# Patient Record
Sex: Female | Born: 1935 | Race: White | Hispanic: No | Smoking: Never smoker
Health system: Southern US, Community
[De-identification: ages and names within clinical notes are randomized; demographics above are authoritative.]

## PROBLEM LIST (undated history)

## (undated) DIAGNOSIS — S72009A Fracture of unspecified part of neck of unspecified femur, initial encounter for closed fracture: Secondary | ICD-10-CM

## (undated) DIAGNOSIS — E785 Hyperlipidemia, unspecified: Secondary | ICD-10-CM

## (undated) DIAGNOSIS — I10 Essential (primary) hypertension: Secondary | ICD-10-CM

## (undated) DIAGNOSIS — Z87898 Personal history of other specified conditions: Secondary | ICD-10-CM

## (undated) DIAGNOSIS — I4510 Unspecified right bundle-branch block: Secondary | ICD-10-CM

## (undated) DIAGNOSIS — Z8659 Personal history of other mental and behavioral disorders: Secondary | ICD-10-CM

## (undated) DIAGNOSIS — M1990 Unspecified osteoarthritis, unspecified site: Secondary | ICD-10-CM

## (undated) DIAGNOSIS — Z8719 Personal history of other diseases of the digestive system: Secondary | ICD-10-CM

## (undated) HISTORY — DX: Unspecified osteoarthritis, unspecified site: M19.90

## (undated) HISTORY — DX: Personal history of other specified conditions: Z87.898

## (undated) HISTORY — PX: TONSILECTOMY, ADENOIDECTOMY, BILATERAL MYRINGOTOMY AND TUBES: SHX2538

## (undated) HISTORY — DX: Hyperlipidemia, unspecified: E78.5

## (undated) HISTORY — DX: Personal history of other mental and behavioral disorders: Z86.59

## (undated) HISTORY — DX: Personal history of other diseases of the digestive system: Z87.19

## (undated) HISTORY — DX: Fracture of unspecified part of neck of unspecified femur, initial encounter for closed fracture: S72.009A

## (undated) HISTORY — DX: Unspecified right bundle-branch block: I45.10

## (undated) HISTORY — DX: Essential (primary) hypertension: I10

---

## 1971-04-01 HISTORY — PX: OTHER SURGICAL HISTORY: SHX169

## 1997-07-27 ENCOUNTER — Other Ambulatory Visit: Admission: RE | Admit: 1997-07-27 | Discharge: 1997-07-27 | Payer: Self-pay | Admitting: Cardiology

## 2001-08-26 ENCOUNTER — Encounter (INDEPENDENT_AMBULATORY_CARE_PROVIDER_SITE_OTHER): Payer: Self-pay | Admitting: Specialist

## 2001-08-26 ENCOUNTER — Inpatient Hospital Stay (HOSPITAL_COMMUNITY): Admission: EM | Admit: 2001-08-26 | Discharge: 2001-09-02 | Payer: Self-pay | Admitting: *Deleted

## 2001-08-27 ENCOUNTER — Encounter: Payer: Self-pay | Admitting: *Deleted

## 2001-08-30 ENCOUNTER — Encounter: Payer: Self-pay | Admitting: *Deleted

## 2001-10-21 ENCOUNTER — Ambulatory Visit (HOSPITAL_COMMUNITY): Admission: RE | Admit: 2001-10-21 | Discharge: 2001-10-21 | Payer: Self-pay | Admitting: *Deleted

## 2001-10-26 ENCOUNTER — Encounter: Payer: Self-pay | Admitting: *Deleted

## 2001-10-26 ENCOUNTER — Ambulatory Visit (HOSPITAL_COMMUNITY): Admission: RE | Admit: 2001-10-26 | Discharge: 2001-10-26 | Payer: Self-pay | Admitting: *Deleted

## 2001-12-08 ENCOUNTER — Other Ambulatory Visit: Admission: RE | Admit: 2001-12-08 | Discharge: 2001-12-08 | Payer: Self-pay | Admitting: Obstetrics and Gynecology

## 2001-12-14 ENCOUNTER — Ambulatory Visit (HOSPITAL_COMMUNITY): Admission: RE | Admit: 2001-12-14 | Discharge: 2001-12-14 | Payer: Self-pay | Admitting: Obstetrics and Gynecology

## 2001-12-14 ENCOUNTER — Encounter: Payer: Self-pay | Admitting: Obstetrics and Gynecology

## 2002-01-12 ENCOUNTER — Ambulatory Visit (HOSPITAL_COMMUNITY): Admission: RE | Admit: 2002-01-12 | Discharge: 2002-01-12 | Payer: Self-pay | Admitting: Obstetrics and Gynecology

## 2002-01-12 ENCOUNTER — Encounter: Payer: Self-pay | Admitting: Obstetrics and Gynecology

## 2002-04-05 ENCOUNTER — Ambulatory Visit (HOSPITAL_COMMUNITY): Admission: RE | Admit: 2002-04-05 | Discharge: 2002-04-05 | Payer: Self-pay | Admitting: Urology

## 2002-04-05 ENCOUNTER — Encounter: Payer: Self-pay | Admitting: Urology

## 2002-05-11 ENCOUNTER — Encounter: Payer: Self-pay | Admitting: Obstetrics and Gynecology

## 2002-05-11 ENCOUNTER — Ambulatory Visit (HOSPITAL_COMMUNITY): Admission: RE | Admit: 2002-05-11 | Discharge: 2002-05-11 | Payer: Self-pay | Admitting: Obstetrics and Gynecology

## 2006-03-31 HISTORY — PX: CHOLECYSTECTOMY: SHX55

## 2006-12-16 ENCOUNTER — Inpatient Hospital Stay (HOSPITAL_COMMUNITY): Admission: EM | Admit: 2006-12-16 | Discharge: 2006-12-21 | Payer: Self-pay | Admitting: Emergency Medicine

## 2006-12-20 ENCOUNTER — Encounter (INDEPENDENT_AMBULATORY_CARE_PROVIDER_SITE_OTHER): Payer: Self-pay | Admitting: Surgery

## 2006-12-21 ENCOUNTER — Ambulatory Visit: Payer: Self-pay | Admitting: Vascular Surgery

## 2007-04-01 DIAGNOSIS — S72009A Fracture of unspecified part of neck of unspecified femur, initial encounter for closed fracture: Secondary | ICD-10-CM

## 2007-04-01 HISTORY — DX: Fracture of unspecified part of neck of unspecified femur, initial encounter for closed fracture: S72.009A

## 2008-02-19 ENCOUNTER — Inpatient Hospital Stay (HOSPITAL_COMMUNITY): Admission: EM | Admit: 2008-02-19 | Discharge: 2008-02-23 | Payer: Self-pay | Admitting: Emergency Medicine

## 2008-02-20 HISTORY — PX: TOTAL HIP ARTHROPLASTY: SHX124

## 2009-10-02 ENCOUNTER — Encounter: Admission: RE | Admit: 2009-10-02 | Discharge: 2009-10-02 | Payer: Self-pay | Admitting: Cardiology

## 2009-10-15 ENCOUNTER — Encounter: Admission: RE | Admit: 2009-10-15 | Discharge: 2009-10-15 | Payer: Self-pay | Admitting: Gastroenterology

## 2010-01-26 ENCOUNTER — Inpatient Hospital Stay (HOSPITAL_COMMUNITY): Admission: EM | Admit: 2010-01-26 | Discharge: 2010-01-29 | Payer: Self-pay | Admitting: Emergency Medicine

## 2010-02-14 ENCOUNTER — Ambulatory Visit: Payer: Self-pay | Admitting: Cardiology

## 2010-06-12 LAB — CBC
HCT: 38.4 % (ref 36.0–46.0)
Hemoglobin: 12.9 g/dL (ref 12.0–15.0)
MCH: 29.6 pg (ref 26.0–34.0)
MCHC: 33.1 g/dL (ref 30.0–36.0)
MCHC: 33.6 g/dL (ref 30.0–36.0)
MCV: 88 fL (ref 78.0–100.0)
MCV: 88.4 fL (ref 78.0–100.0)
Platelets: 266 10*3/uL (ref 150–400)
Platelets: 297 10*3/uL (ref 150–400)
RBC: 4.37 MIL/uL (ref 3.87–5.11)
RDW: 13.5 % (ref 11.5–15.5)
RDW: 13.5 % (ref 11.5–15.5)
WBC: 6.8 10*3/uL (ref 4.0–10.5)
WBC: 9 10*3/uL (ref 4.0–10.5)

## 2010-06-12 LAB — DIFFERENTIAL
Basophils Absolute: 0 10*3/uL (ref 0.0–0.1)
Basophils Relative: 1 % (ref 0–1)
Eosinophils Absolute: 0.1 10*3/uL (ref 0.0–0.7)
Eosinophils Relative: 1 % (ref 0–5)
Lymphocytes Relative: 12 % (ref 12–46)
Lymphocytes Relative: 31 % (ref 12–46)
Lymphs Abs: 1.1 10*3/uL (ref 0.7–4.0)
Lymphs Abs: 2.1 10*3/uL (ref 0.7–4.0)
Monocytes Absolute: 0.6 10*3/uL (ref 0.1–1.0)
Monocytes Absolute: 0.7 10*3/uL (ref 0.1–1.0)
Monocytes Relative: 7 % (ref 3–12)
Monocytes Relative: 9 % (ref 3–12)
Neutro Abs: 3.9 10*3/uL (ref 1.7–7.7)
Neutro Abs: 7.1 10*3/uL (ref 1.7–7.7)
Neutrophils Relative %: 58 % (ref 43–77)
Neutrophils Relative %: 79 % — ABNORMAL HIGH (ref 43–77)

## 2010-06-12 LAB — COMPREHENSIVE METABOLIC PANEL
Albumin: 2.6 g/dL — ABNORMAL LOW (ref 3.5–5.2)
Alkaline Phosphatase: 151 U/L — ABNORMAL HIGH (ref 39–117)
BUN: 7 mg/dL (ref 6–23)
Calcium: 8.6 mg/dL (ref 8.4–10.5)
Creatinine, Ser: 0.55 mg/dL (ref 0.4–1.2)
Potassium: 3.5 mEq/L (ref 3.5–5.1)
Total Protein: 5.2 g/dL — ABNORMAL LOW (ref 6.0–8.3)

## 2010-06-12 LAB — PROTIME-INR
INR: 1.05 (ref 0.00–1.49)
Prothrombin Time: 13.9 seconds (ref 11.6–15.2)

## 2010-06-12 LAB — HEMOGLOBIN A1C: Mean Plasma Glucose: 123 mg/dL — ABNORMAL HIGH (ref <117)

## 2010-06-12 LAB — POCT CARDIAC MARKERS
CKMB, poc: <1 ng/mL — ABNORMAL LOW (ref 1.0–8.0)
Myoglobin, poc: 68.5 ng/mL (ref 12–200)
Troponin i, poc: <0.05 ng/mL (ref 0.00–0.09)

## 2010-06-12 LAB — URINALYSIS, ROUTINE W REFLEX MICROSCOPIC
Bilirubin Urine: NEGATIVE
Glucose, UA: NEGATIVE mg/dL
Hgb urine dipstick: NEGATIVE
Ketones, ur: 15 mg/dL — AB
Nitrite: NEGATIVE
Protein, ur: NEGATIVE mg/dL
Specific Gravity, Urine: 1.021 (ref 1.005–1.030)
Urobilinogen, UA: 1 mg/dL (ref 0.0–1.0)
pH: 5.5 (ref 5.0–8.0)

## 2010-06-12 LAB — POCT I-STAT, CHEM 8
BUN: 11 mg/dL (ref 6–23)
Calcium, Ion: 1.19 mmol/L (ref 1.12–1.32)
Chloride: 106 mEq/L (ref 96–112)
Creatinine, Ser: 0.5 mg/dL (ref 0.4–1.2)
Glucose, Bld: 109 mg/dL — ABNORMAL HIGH (ref 70–99)
HCT: 40 % (ref 36.0–46.0)
Hemoglobin: 13.6 g/dL (ref 12.0–15.0)
Potassium: 3.7 mEq/L (ref 3.5–5.1)
Sodium: 138 mEq/L (ref 135–145)
TCO2: 24 mmol/L (ref 0–100)

## 2010-06-12 LAB — URINE CULTURE
Colony Count: >=100000
Culture  Setup Time: 201110300232

## 2010-06-12 LAB — HEPATIC FUNCTION PANEL
ALT: 17 U/L (ref 0–35)
AST: 24 U/L (ref 0–37)
Albumin: 3.1 g/dL — ABNORMAL LOW (ref 3.5–5.2)
Alkaline Phosphatase: 192 U/L — ABNORMAL HIGH (ref 39–117)
Bilirubin, Direct: 0.3 mg/dL (ref 0.0–0.3)
Indirect Bilirubin: 0.9 mg/dL (ref 0.3–0.9)
Total Bilirubin: 1.2 mg/dL (ref 0.3–1.2)
Total Protein: 5.8 g/dL — ABNORMAL LOW (ref 6.0–8.3)

## 2010-06-12 LAB — CARDIAC PANEL(CRET KIN+CKTOT+MB+TROPI)
Relative Index: INVALID (ref 0.0–2.5)
Total CK: 40 U/L (ref 7–177)
Troponin I: 0.01 ng/mL (ref 0.00–0.06)

## 2010-06-12 LAB — RPR: RPR Ser Ql: NONREACTIVE

## 2010-06-12 LAB — RAPID URINE DRUG SCREEN, HOSP PERFORMED
Amphetamines: NOT DETECTED
Barbiturates: NOT DETECTED
Benzodiazepines: NOT DETECTED
Opiates: POSITIVE — AB

## 2010-06-12 LAB — HEMOCCULT GUIAC POC 1CARD (OFFICE): Fecal Occult Bld: NEGATIVE

## 2010-06-12 LAB — VITAMIN B12: Vitamin B-12: 351 pg/mL (ref 211–911)

## 2010-06-12 LAB — TSH: TSH: 0.701 u[IU]/mL (ref 0.350–4.500)

## 2010-06-17 ENCOUNTER — Ambulatory Visit (INDEPENDENT_AMBULATORY_CARE_PROVIDER_SITE_OTHER): Payer: Medicare Other | Admitting: Cardiology

## 2010-06-17 DIAGNOSIS — I119 Hypertensive heart disease without heart failure: Secondary | ICD-10-CM

## 2010-06-17 DIAGNOSIS — Z79899 Other long term (current) drug therapy: Secondary | ICD-10-CM

## 2010-06-17 DIAGNOSIS — E7800 Pure hypercholesterolemia, unspecified: Secondary | ICD-10-CM

## 2010-08-13 NOTE — H&P (Signed)
[name], [name]                  ACCOUNT NO.:  [id number]   MEDICAL RECORD NO.:  [id number]          PATIENT TYPE:  INP   LOCATION:  1529                         FACILITY:  [facility]   PHYSICIAN:  [name] [name], M.D. DATE OF BIRTH:  Sep 16, 1922   DATE OF ADMISSION:  12/16/2006  DATE OF DISCHARGE:                              HISTORY & PHYSICAL   CHIEF COMPLAINT:  Abdominal pain.   HISTORY:  This is an 77 year old widowed Caucasian female admitted with  abdominal discomfort and back pain.  She went to bed last night feeling  well and awoke in the middle of the night with severe back pain which  radiated around to her right upper quadrant of the abdomen.  She started  to vomit several times and it was dark emesis.  She had several normal  bowel movements last evening as well.  She did not have any chest pain  to suggest angina.  She was brought by her family to the emergency room,  where her serum amylase was greater than 5000 and it was felt that she  had acute pancreatitis.  CT scan of the abdomen showed evidence of  pancreatitis and raised question of cholelithiasis.  An ultrasound of  the abdomen was requested and results are pending.  The patient does not  have any history of known prior cholelithiasis.  The patient was  hospitalized by Dr. Roosvelt [name] in 2003 for what was diagnosed as  severe inflammatory colitis secondary to diverticulosis and at that  time, was seen by Dr. Wenda [name] and surgery was contemplated, but the  patient improved and did not require surgery.   FAMILY HISTORY:  The patient's sister died of colon cancer.  The  patient's mother died at a young age of tuberculosis.  The patient's  father died of suicide.   SOCIAL HISTORY:  Reveals that the patient is a widow.  She lives alone.  She originally had 3 children, but a son died at age 22 and she has 2  living children who live in [place].  The patient does not use  alcohol or tobacco.   HOME  MEDICATIONS:  1. Lexapro 10 mg daily.  2. Zetia 10 mg daily.  3. Lipitor 10 mg half a tablet daily.  4. Benicar 20 mg daily.  5. Lasix 20 mg p.r.n.  6. Calcium daily.  7. Ecotrin 325 mg daily.  8. Xanax for p.r.n. use.  9. She takes Actonel 35 mg a week.   ALLERGIES:  The patient is allergic to CODEINE.   PREVIOUS SURGERY:  Hysterectomy and breast biopsy years ago.   REVIEW OF SYSTEMS:  She has a history of hypertension and  hypercholesterolemia.  She has had mild cardiomegaly on chest x-ray and  has had a right bundle branch block on EKG for the past several years.  She had an echocardiogram, July 02, 2006, which showed normal LV  ejection fraction of 55% and LVH.  The patient has no history of peptic  ulcer disease.  She does have a history of urinary hesitancy and  urgency.  Remainder of review of systems is negative in detail.   PHYSICAL EXAM:  VITAL SIGNS:  Blood pressure is 130/60, pulse 90,  regular, temperature 97.7.  GENERAL APPEARANCE:  Reveals an elderly thin woman in no acute distress.  SKIN:  Warm and dry.  She is anicteric.  HEENT:  Reveals no scleral icterus.  Pupils are equal and reactive.  CHEST:  Clear to auscultation.  HEART:  Reveals no gallop, murmur, click or rub.  BREASTS:  No masses.  ABDOMEN:  Remarkable for decreased bowel sounds and tenderness in the  epigastrium with a sense of fullness to palpation in the epigastrium  consistent with pancreatitis.  EXTREMITIES:  Show no phlebitis or edema.  Pedal pulses are present.   IMPRESSION:  1. Acute pancreatitis, possibly secondary to cholelithiasis.  2. History of hyperlipidemia, on Lipitor and Zetia.  3. History of depression, on Lexapro.  4. History of hypertension, on Benicar.   DISPOSITION:  We are admitting to the general medical floor.  Will keep  her n.p.o.  We will give her IV fluid resuscitation and IV analgesics.  I will ask the surgeons to see and follow.  We will give IV Protonix   empirically for positive gastric emesis.  We will hold her Lipitor and  Zetia at this point.   CONDITION ON ADMISSION:  Guarded.           ______________________________  [name] [name], M.D.     TB/MEDQ  D:  12/16/2006  T:  12/17/2006  Job:  [id number]   cc:   [name] Park Daphine Deutscher, MD  [address], [address]  [place]  [place] [address]   [name] [name] [facility]

## 2010-08-13 NOTE — Discharge Summary (Signed)
NAME:  [name] [name], [name] [name]                  ACCOUNT NO.:  [id number]   MEDICAL RECORD NO.:  [id number]          PATIENT TYPE:  INP   LOCATION:  1529                         FACILITY:  [facility]   PHYSICIAN:  Alfonse Ras, MD   DATE OF BIRTH:  July 16, 1922   DATE OF ADMISSION:  12/16/2006  DATE OF DISCHARGE:                               DISCHARGE SUMMARY   HISTORY OF PRESENT ILLNESS:  The patient is an 77 year old white female  who was admitted with abdominal and chest pain also which went through  to her back and her right upper quadrant.  Workup with CT scan has shown  evidence of pancreatitis consistent with her elevated enzymes.  She  admits to significant nausea and right back pain.  She has come into the  emergency room because of her abdominal pain.  She was admitted by Dr.  Patty Sermons, and I was asked to consult from the surgical perspective.   PAST MEDICAL HISTORY:  Significant for inflammatory colitis back in 2003  without surgical intervention.   PAST SURGICAL HISTORY:  Significant for breast biopsy and a hysterectomy  in the remote past.   PHYSICAL EXAMINATION:  GENERAL:  On physical exam, she is an age-  appropriate white female in minimal distress.  HEENT:  Benign.  Normocephalic and atraumatic.  Pupils are equal, round,  and reactive to light.  She does appear to have a cataract in the left  eye.  There is no jaundice.  LUNGS:  Clear to auscultation and percussion x2.  HEART:  Regular rate and rhythm without murmurs, rubs, or gallops.  ABDOMEN:  Decreased bowel sounds but is tender in the epigastrium to  fairly deep palpation.  EXTREMITIES:  Show no clubbing, cyanosis, or edema.  VITAL SIGNS:  Her blood pressure is 126/70, her heart rate is 92, and  her temperature is 98.2.   LABORATORY DATA:  Laboratory value shows an amylase over 5000 and lipase  over 2000.  Her white blood cell count is 12,000.  Her ultrasound shows  sludge, but no stones and minimal dilatation of her  hepatic ducts.   IMPRESSION:  Probable biliary pancreatitis.   PLAN:  I agree with NPO status, IV fluids, but she will probably require  a laparoscopic cholecystectomy during this hospitalization.   We will continue to follow along with you.      Alfonse Ras, MD  Electronically Signed     KRE/MEDQ  D:  12/16/2006  T:  12/17/2006  Job:  [id number]   cc:   Cassell Clement, M.D.  Fax: [phone]

## 2010-08-13 NOTE — Consult Note (Signed)
NAME:  [name] [name], [name] [name]                  ACCOUNT NO.:  [id number]   MEDICAL RECORD NO.:  [id number]          PATIENT TYPE:  EMS   LOCATION:  ED                           FACILITY:  [facility]   PHYSICIAN:  Lyn Records, M.D.   DATE OF BIRTH:  April 07, 1922   DATE OF CONSULTATION:  02/19/2008  DATE OF DISCHARGE:                                 CONSULTATION   CONCLUSIONS:  1. The patient has no known cardiac problems.  2. Acute right hip fracture.  3. History of depression.  4. Hypertension.  5. Hyperlipidemia.   RECOMMENDATIONS:  1. The patient is cleared for surgery.  Her rate should be low for      cardiac-related complications given her benign past medical history      and absence of cardiac symptoms.  2. Continue usual home medications.  3. Check a baseline BNP.  4. ECG postoperative.   COMMENT:  The patient is a pleasant 77 year old who fell from a step  earlier today when she lost her balance.  She did not lose  consciousness.  She was reaching over to pick up an object when she lost  her balance and could not ride herself.  There was no chest pain,  palpitations, shortness of breath, or other complaint preceding or  following the fall.  She also had pain in the right hip after the fall.  She has never had syncope.  There is no history of congestive heart  failure, aortic valve disease, arrhythmias, or other chronic medical  problems other than as noted above.   ALLERGIES:  Codeine, causes nausea.   MEDICATIONS:  1. Lipitor 10 mg per day.  2. Zetia 10 mg per day.  3. Benicar 20 mg 1/2 tablet per day.  4. Lexapro 10 mg per day.  5. Lasix 20 mg as needed for peripheral edema.   PREVIOUS SURGERIES:  1. Cholecystectomy following an episode of gallstone pancreatitis,      2008.  2. Hysterectomy.  3. Breast biopsy   SOCIAL HISTORY:  She does not smoke or drink.  She is widowed, has 3  children, one of whom died at age 60.   PHYSICAL EXAMINATION:  GENERAL:  The patient is in no  distress.  She is  lying flat on the hospital gurney.  She has 2 visitors in the room.  I  do not set her up because of the hip fracture and pain with movement.  VITAL SIGNS:  The respiratory rate is 16, blood pressure 132/70, and  heart rate is 68.  LUNGS:  Clear to auscultation anteriorly.  CARDIAC:  The cardiac exam reveals no gallop, rub, or murmur.  ABDOMEN:  Her abdomen is soft.  Bowel sounds are normal.  Liver and  spleen are not palpable.  Bowel sounds are normal.  EXTREMITIES:  Extremities reveals an externally rotated foot.  Pulses  are 2+ in the posterior tibial and dorsalis pedis bilaterally.  NEUROLOGIC:  The neurologic exam reveals the patient who is alert,  oriented, and exhibits no focal deficits.  She does not move the right  leg because of the fracture.   The EKG is unremarkable.  It exhibits normal sinus rhythm with a chronic  stable right bundle-branch block.  There appears to be limb lead  reversal on the EKG that was performed.  The x-ray demonstrates normal  lung fields without infiltrates or congestion.  The heart size was  slightly prominent, but the technique could account for this.  There was  descending aortic tortuosity.  The right lower extremity x-ray reveals a  right femoral shaft fracture.   Laboratory data demonstrates an INR of 0.9; hemoglobin 13.4; potassium  3.7, BUN and creatinine 17 and 1.1.   DISCUSSION:  The patient's electrocardiogram demonstrates a right bundle-  branch block.  She has normal sinus rhythm without acute ST-T wave  change.  No symptoms suggestive of decompensated cardiac problem.  She  should be relatively at low risk for cardiac complications.  I have,  therefore, cleared her to proceed.  We will follow postoperatively to  help in any way possible.      Lyn Records, M.D.  Electronically Signed     HWS/MEDQ  D:  02/19/2008  T:  02/20/2008  Job:  [id number]   cc:   Cassell Clement, M.D.  Fax: [phone]   Dr. Shon Baton

## 2010-08-13 NOTE — Discharge Summary (Signed)
[name], [name]                  ACCOUNT NO.:  [id number]   MEDICAL RECORD NO.:  [id number]          PATIENT TYPE:  INP   LOCATION:  1537                         FACILITY:  [facility]   PHYSICIAN:  Alvy Beal, MD    DATE OF BIRTH:  02/09/1923   DATE OF ADMISSION:  02/19/2008  DATE OF DISCHARGE:  02/23/2008                               DISCHARGE SUMMARY   ADMISSION DIAGNOSES:  1. Right intertrochanteric hip fracture.  2. History of pancreatitis.  3. Hypertension.  4. Diverticulitis.  5. Hypercholesteremia.  6. Irregular heartbeat.   DISCHARGE DIAGNOSES:  1. Status post IM nail of the right hip.  2. Resolved postoperative anemia.  3. Stable hypokalemia.   HISTORY:  [name] [name] is a pleasant 77 year old female who fell on November  21 while she was walking up the stairs and slipped.  She noted immediate  onset of right hip pain.  She was transferred to the emergency room at  [facility] evaluated by Dr. Shon Baton and found to have a regular  rate intertrochanteric hip fracture.  Following cardiac clearance, the  patient was to be scheduled to undergo IM nail of the right hip.   CONSULTS:  PT and OT.  Cardiology.  Case management.   LABORATORY:  On admission, BMET showed sodium 139, potassium 4.2,  slightly low, glucose 129, normal BUN and creatinine.  This was followed  throughout the hospital course.  By the time of discharge, potassium  slightly decreased at 3.4.  Preoperative urinalysis showed trace  ketones, otherwise negative.  PT/INR at admission showed PT of 13.8, INR  of 1.  At time of discharge she was therapeutic on Coumadin with INR of  2.  BNP admission of 69.2.  Blood type B+.  On admission the patient had  a hematocrit 39.9.  Following surgery, this did drop to 7.7.  The  patient was transfused.  At time of discharge she was stable with a  hemoglobin of 11.2, hematocrit 33.1.  Preoperative EKG showed sinus  rhythm with premature supraventricular complexes,  right bundle branch  block with no T-wave abnormality.  Questionable inferior ischemia.  Reviewed by cardiology.  X-rays of the hip showed displaced  intertrochanteric right hip fracture.  Postoperative films showed ORIF  of the right hip.  Preop chest x-ray shows no active disease.   HOSPITAL COURSE:  The patient was admitted, medical clearance was  obtained from cardiology, consent was obtained.  She was then taken to  the operating room and underwent IM nail by Dr. Venita Lick.  She was  then transferred to the PACU and then to the orthopedic floor for  continued postop care.  The patient was placed on Coumadin  postoperatively.  The patient did fairly well following her surgery.  There were no significant complications.  She did have a slight drop in  her hemoglobin to 7.7.  She was transfused with 2 units packed red blood  cells.  The case manager was consulted for placement.  Coumadin was  continued per pharmacy for DVT prophylaxis.  The patient was continued  with PT/OT.  The patient continued to progress slowly.  Postop day  number 4, felt patient stable to be discharged to skilled nursing  facility.  Cardiology agreed.  The patient was stable.  She did have a  slight drop in her potassium to 3.4.  This was supplemented.   DISPOSITION:  Patient stable to be discharged to skilled nursing  facility of choice.  She will need followup with Dr. Shon Baton in  approximately in 2 weeks for x-ray as well as staple removal.  The  patient is to be toe-touch weightbearing to the right lower extremity.  Hip precautions should be initiated.  Daily dressing changes to the  right hip.  Okay for her to shower.  Diet is advance as tolerated.   DISCHARGE MEDICATIONS:  1. Lexapro 10 mg 1 p.o. daily.  2. Zetia 10 mg 1 p.o. daily.  3. Lipitor 10 mg 1 p.o. daily.  4. Benicar 20 mg 1 p.o. daily.  5. Furosemide 20 mg 1 p.o. p.r.n.  6. Fosamax weekly.  7. Coumadin as dosed per pharmacy.  8.  Trinsicon 1 p.o. t.i.d.  9. Vitamin C 500 mg daily.  10.Robaxin 500 mg 1 p.o. q.8 p.r.n. spasm.  11.Percocet 5/325 one to two p.o. q.4 to 6 p.r.n. pain.  12.Colace 100 mg one p.o. b.i.d.   CONDITION ON DISCHARGE:  Stable.   FINAL DIAGNOSIS:  Status post IM nail to the right hip.      Roma Schanz, P.A.      Alvy Beal, MD  Electronically Signed    CS/MEDQ  D:  02/23/2008  T:  02/23/2008  Job:  [id number]

## 2010-08-13 NOTE — Op Note (Signed)
NAME:  [name] [name], [name] [name]                  ACCOUNT NO.:  [id number]   MEDICAL RECORD NO.:  [id number]          PATIENT TYPE:  INP   LOCATION:  1537                         [facility]AC[facility]:  [facility]   PHYSICIAN:  Alvy Beal, MD    DATE OF BIRTH:  07/19/1922   DATE OF PROCEDURE:  DATE OF DISCHARGE:                               OPERATIVE REPORT   PREOPERATIVE DIAGNOSIS:  Right intertrochanteric hip fracture.   POSTOPERATIVE DIAGNOSIS:  Right intertrochanteric hip fracture.   PROCEDURE:  Intertroch nail for ORIF of intertrochanteric hip fracture  utilizing the 130 degree 235 length troch nail with a 100 mm lag screw  and a 34 mm distal locking screw.   COMPLICATIONS:  None.   CONDITION:  Stable.   ESTIMATED BLOOD LOSS:  Minimal.   HISTORY:  This is a pleasant 77 year old woman who fell yesterday and  was admitted with a diagnosis of intertrochanteric hip fracture.  All  appropriate risks, benefits and alternatives were discussed with the  patient and her family.  Consent was obtained to the aforementioned  procedure.   OPERATIVE NOTE:  The patient was brought to the operating room and  placed on the fracture table.  After the successful induction of general  anesthesia and endotracheal intubation, TEDs, SCDs and a Foley were  applied.  The operative leg was appropriately identified preoperatively.  This was the right side.  The left leg was placed in a well leg holder  in a slightly flexed abducted position.  The right lower extremity was  placed in traction.  A gentle closed reduction maneuver was performed.  X-rays confirmed satisfactory reduction.  The right lower extremity was  then prepped and draped in a standard fashion.   The I pin was inserted percutaneously down to the level of the tip of  the greater troch and was confirmed on the AP and lateral planes and  then advanced just to about 3-4 cm below the lesser troch, confirmed  trajectory in position in the AP and lateral planes.   Once confirmed I  made a small incision at the insertion site of the pin and advanced the  all-in-one step drill.  I drilled through the greater troch and into the  proximal femur.  Because of the narrow femoral canal I placed a long  guidewire down and then reamed to a single pass with 11.5 distal reamer  just so that the nail would not get caught up as it was passing  distally.  This allowed me to freely insert the troch nail without  complications.  Once it was at the appropriate depth I then made a  counter incision on the lateral side of the femur and advanced the  working portal through the guide so it would rest on the lateral aspect  of the femur.  I confirmed position in the AP and lateral planes and  advanced the guide pin through the femur, the nail and into the femoral  head.  I confirmed satisfactory position in the AP and lateral planes  and the appropriate depth.  I then measured, overdrilled  and advanced  the 100 mm lag screw.  At this point with this fracture satisfactorily  reduced and the hardware in good position I locked the nail in place by  advancing the screw driver down the head of the nail to the locking  device.  I backed it off a quarter turn.  I then advanced the locking  pin guide through the external targeting device and through the same  incision I used earlier.  I then placed on the lateral side of the  femur, drilled across the femur and measured.  I placed a 34 mm locking  screw without complication.   I removed the external jig, took final C-arm films which were  satisfactory.  At this point I irrigated the wounds, closed the deep  fascia with interrupted #1 Vicryl sutures, superficial with 2-0 Vicryl  sutures and staples for the skin.  Dry dressing was applied.  The  patient was extubated and transferred to the PACU without incident.  At  the end of the case all needle and sponge counts were correct.      Alvy Beal, MD  Electronically  Signed     DDB/MEDQ  D:  02/20/2008  T:  02/20/2008  Job:  [id number]

## 2010-08-13 NOTE — Discharge Summary (Signed)
[name] [name], [name]                  ACCOUNT NO.:  [id number]   MEDICAL RECORD NO.:  [id number]          PATIENT TYPE:  INP   LOCATION:  1529                         [facility]ACILITY:  [facility]   PHYSICIAN:  [name] [name], M.D. DATE OF BIRTH:  02-Dec-1922   DATE OF ADMISSION:  12/16/2006  DATE OF DISCHARGE:  12/21/2006                               DISCHARGE SUMMARY   FINAL DIAGNOSES:  1. Biliary pancreatitis with cholelithiasis.  2. History of hyperlipidemia.  3. History of depression.  4. Essential hypertension.   OPERATIONS PERFORMED:  Laparoscopic cholecystectomy with intraoperative  cholangiography on December 20, 2006, by Dr. Darnell [name].   HISTORY:  This 77 year old widowed Caucasian female was admitted as an  emergency on December 16, 2006, because of abdominal discomfort and  back pain.  She had gone to bed the previous evening feeling well, but  awoke in the middle of the night with severe back pain radiating to the  right upper quadrant.  She vomited several times and it was dark emesis.  She also had several normal bowel movement the previous evening.  She  did not have any chest pain to suggest angina.  She came to the  emergency room, brought by her family, and her serum amylase was noted  to be greater than 5000.  It was felt that she had acute pancreatitis.  Workup also revealed evidence of cholelithiasis.  The patient did not  have any prior history of known gallbladder disease, but in 2003, she  had been hospitalized by Dr. Roosvelt [name] for what was felt to be  severe inflammatory colitis secondary to diverticulosis.   FAMILY HISTORY:  Revealed that the patient's sister died of colon  cancer.   SOCIAL HISTORY:  Reveals that the patient is a widow.  She lives alone.  She has a son who died at age 66, and she has two living daughters who  live in [place].  The patient does not use alcohol or tobacco.   HOME MEDICATIONS:  Lexapro, Zetia, Lipitor, Benicar, Lasix,  calcium,  Ecotrin, Xanax and Actonel.   PHYSICAL EXAMINATION ON ADMISSION:  VITAL SIGNS:  Blood pressure 130/60,  pulse 90, temperature 97.  GENERAL:  This is an elderly woman in no distress.  She is not icteric.  SKIN:  Warm and dry.  LUNGS:  Clear.  HEART:  Reveals no gallop.  BREASTS:  No masses.  ABDOMEN:  Reveals a very quiet abdomen with absent bowel sounds and has  a sense of fullness to palpation in the epigastrium, and there is  tenderness in the epigastrium.  EXTREMITIES:  Show no phlebitis and pedal pulses are present.   HOSPITAL COURSE:  The patient was admitted to the general medical floor.  She was seen in consultation by General Surgery and followed.  She was  given IV fluids and IV analgesics.  She was also given IV Protonix  empirically.  Her Lipitor and Zetia were held.  The patient initially  had an amylase of 5682 with a lipase greater than 2000.  On the day of  discharge, amylase was down  to 144 with a lipase of 49.  At the time of  discharge, her sodium was 139, potassium 3.5, blood sugar 93, BUN 10,  creatinine 0.6.  Bilirubin was 1.2, SGOT at discharge still slightly  high at 38 with SGPT of 45 and total protein was down to 5.5 with an  albumin of 2.6 at discharge.   One day prior to discharge on September 21, the patient was taken to  surgery and underwent laparoscopic cholecystectomy by Dr. Darnell [name].  The patient tolerated the procedure well.  By the next morning, she was  afebrile.  Vital signs were stable.  She was hungry and it was felt that  she was stable enough for discharge.  She did have a slight erythema and  discomfort in the left calf which looked more like an insect bite with  erythema, but it was tender, and in view of the clinical setting prior  to discharge, we did get venous Dopplers to be sure that this was not an  atypical presentation of DVT.  The Dopplers showed no evidence of DVT,  and she was cleared for discharge by the surgeons  and was able to be  discharged improved.   DISCHARGE MEDICATIONS:  1. Tylenol 3.252 every 4-6 hours p.r.n. for pain.  2. Lexapro 10 mg daily.  3. Benicar 20 mg daily.  4. Lasix 20 mg p.r.n.  5. Calcium daily.  6. Xanax p.r.n.  7. Actonel 35 mg per week.  8. She will resume her Lipitor (10 mg one half tablet daily) and her      Zetia (10 mg daily) on October 1.   CONDITION ON DISCHARGE:  Improved.   FOLLOWUP:  Follow-up will be with General Surgery as directed, and she  is to see Dr. Patty [name] in 2 weeks for follow-up office visit.           ______________________________  [name] [name], M.D.     TB/MEDQ  D:  12/21/2006  T:  12/22/2006  Job:  [id number]   cc:   [name] Heckler, MD  [address]  [place]  [place] [address]   [name] Ras, MD  [address], [address]  [place]  [place] [address]

## 2010-08-13 NOTE — Op Note (Signed)
[name] [name], [name] [name]                  ACCOUNT NO.:  [id number]   MEDICAL RECORD NO.:  [id number]          PATIENT TYPE:  INP   LOCATION:  1529                         FACILITY:  [facility]   PHYSICIAN:  Velora Heckler, MD      DATE OF BIRTH:  08/10/1922   DATE OF PROCEDURE:  12/20/2006  DATE OF DISCHARGE:                               OPERATIVE REPORT   PREOPERATIVE DIAGNOSIS:  Biliary pancreatitis, cholelithiasis.   POSTOPERATIVE DIAGNOSIS:  Biliary pancreatitis, cholelithiasis.   PROCEDURE:  Laparoscopic cholecystectomy with intraoperative  cholangiography.   SURGEON:  Velora Heckler, MD, FACS   ASSISTANT:  Adolph Pollack, M.D., FACS   ANESTHESIA:  General per Dr. Ronelle Nigh.   ESTIMATED BLOOD LOSS:  Minimal.   PREPARATION:  Betadine.   COMPLICATIONS:  None.   INDICATIONS:  The patient is an 77 year old white female initially  admitted on the medical service for the abdominal pain.  She was found  to have biliary pancreatitis when CT scan of the abdomen showed  pancreatitis and serum amylase level was greater than 5000.  The patient  was managed conservatively with bowel rest, intravenous fluids and pain  control.  Her enzymes spontaneously improved.  She now comes to the  operating room for cholecystectomy.   BODY OF REPORT:  Procedure was done in OR #1 at [facility].  The patient is brought to the operating room, placed in  supine position on the operating room table.  Following administration  of general anesthesia the patient is prepped and draped in usual strict  aseptic fashion.  After ascertaining that an adequate level of  anesthesia been obtained, a infraumbilical incision was made with a #15  blade.  Dissection was carried down through subcutaneous tissues.  Fascia was incised in the midline.  The peritoneal cavity is entered  cautiously.  A 0 Vicryl pursestring suture was placed in the fascia.  A  Hasson cannula was introduced and secured  with a pursestring suture.  Abdomen was insufflated with carbon dioxide.  The laparoscope was  introduced.  Abdomen was explored.  There is a distended, edematous  appearing gallbladder in the right upper quadrant.  Operative ports were  placed along the right costal margin in the midline, midclavicular line,  anterior axillary line.  Fundus of the gallbladder is grasped and  retracted cephalad.  Dissection is begun at the neck of the gallbladder.  Cystic duct is dissected out.  Clip was placed at the neck of the  gallbladder.  Cystic duct is incised.  Blood tinge bile emanates from  the cystic duct.  A Cook cholangiography catheter was introduced through  stab wound in the right upper quadrant and inserted into the cystic duct  under direct vision.  Using C-arm fluoroscopy, real time cholangiography  was performed.  There is rapid filling of the spiral valves of Heister  in the cystic duct.  There is flow into the common bile duct.  There is  reflux of contrast into the right and left hepatic ductal systems.  There is free flow distally  into the duodenum.  There are no significant  filling defects or evidence of obstruction.  Cholangiogram was reviewed  by Dr. Maryelizabeth Rowan in radiology who agrees with the above  interpretation.   Next the [facility] catheter was removed from the peritoneal cavity.  Cystic  duct is triply clipped and divided.  Cystic artery was dissected out,  doubly clipped, and divided.  Posterior branches of the cystic artery  are singly clipped and then divided with the electrocautery.  Gallbladder is excised from the gallbladder bed using the hook  electrocautery for hemostasis.  Gallbladder was then extracted through  the umbilical port without difficulty.  It is submitted in its entirety  to pathology for review.  Right upper quadrant was irrigated with warm  saline which was evacuated.  Good hemostasis was noted.  Pneumoperitoneum was released.  Ports are removed  under direct vision.  Good hemostasis was noted at all port sites.  Port sites were  anesthetized with local anesthetic.  All wounds were closed with  interrupted 4-0 Vicryl subcuticular sutures.  Wounds washed and dried  and Benzoin and Steri-Strips are applied.  Sterile dressings were  applied.  The patient is awakened from anesthesia and brought to the  recovery room in stable condition.  The patient tolerated the procedure  well.      Velora Heckler, MD  Electronically Signed     TMG/MEDQ  D:  12/20/2006  T:  12/21/2006  Job:  [id number]   cc:   Cassell Clement, M.D.  Fax: [phone]

## 2010-08-13 NOTE — H&P (Signed)
NAME:  [name] [name]                  ACCOUNT NO.:  [id number]   MEDICAL RECORD NO.:  [id number]          PATIENT TYPE:  EMS   LOCATION:  ED                           FACILITY:  [facility]   PHYSICIAN:  Alvy Beal, MD    DATE OF BIRTH:  1922/05/20   DATE OF ADMISSION:  02/19/2008  DATE OF DISCHARGE:                              HISTORY & PHYSICAL   REASON FOR ADMISSION:  Right intertrochanteric hip fracture.   HISTORY:  [name] [name] is a very pleasant 77 year old woman who was in her  usual state of health until she fell earlier today.  The patient reports  that she was walking up the stairs and slipped and fell.  There was no  loss of consciousness, no head injury, no blurry vision, no syncopal  complaints.  She fell and she noted immediate right hip pain and was  unable to ambulate.  The patient was transferred here to the emergency  room at [facility] for further management.   PAST MEDICAL, SURGICAL, FAMILY AND SOCIAL HISTORY:  Includes  pancreatitis, status post laparoscopic cholecystectomy in September  2008.  She has hypertension, diverticulitis, hypercholesterolemia,  irregular heartbeat.  She has been seen by Dr. Katrinka Blazing from Cardiology and  cleared for surgery.  She is a nonsmoker, nondrinker, non-drug abuser.   DRUG ALLERGIES:  CODEINE.   She is currently taking Lexapro, Zetia, Lipitor, Benicar, furosemide.   CLINICAL EXAM:  She is a pleasant woman who appears her stated age in no  acute distress.  She is alert and oriented x3.  She does have slight  hearing loss.  Cranial nerves II-XII are grossly intact except for  diminished hearing.  No shortness of breath or chest pain.  ABDOMEN:  Soft and nontender.  No history of incontinence of bowel and  bladder.  She is neurologically intact with intact dorsalis pedis pulses,  posterior tibialis pulses, EHL, gastrocnemius.  Tibialis anterior  function is intact.  No sensory deficits in the lower extremity.  No  knee or ankle pain  bilaterally.  Left lower extremity is asymptomatic in  terms of range of motion and pain.  Intact dorsalis pedis, posterior  tibialis pulses.   X-rays demonstrate an intertrochanteric four-part fracture with  comminution.   At this point in time I have discussed in length with the patient, her  daughter-in-law, family and friend who is present for the discussion.  The patient has an intertrochanteric complex fracture.  The treatment  options include nonoperative management and surgical management.  I  think the best course of action to give her the best recovery is a  surgical one.  I have discussed this with the family and the patient.  The risks include infection, bleeding, nerve damage, death, stroke,  paralysis, failure to heal, need for further surgery, degenerative joint  disease, hardware failure, nonunion, need for further surgery in the  future.  All of this was discussed.  All the family's and the patient's  questions were addressed and we will plan on admission, pain control and  then surgery Sunday, February 19, 2008.  SURGICAL DIAGNOSIS:  Intertrochanteric right hip fracture.   SURGICAL PROCEDURE:  Open reduction internal fixation with a  trochanteric nail.      Alvy Beal, MD  Electronically Signed     DDB/MEDQ  D:  02/19/2008  T:  02/19/2008  Job:  [id number]

## 2010-08-16 NOTE — Discharge Summary (Signed)
[facility]  Patient:    [name] [name], [name] [name] Visit Number: [id number] MRN: [id number]          Service Type: MED Location: 3W 0352 01 Attending Physician:  Mingo Amber Dictated by:   Roosvelt Harps, M.D. Admit Date:  08/26/2001 Discharge Date: 09/02/2001   CC:         Thomas A. Patty Sermons, M.D.  [place] Daphine Deutscher, M.D.   Discharge Summary  DISCHARGE DIAGNOSES: 1. Severe inflammatory colitis of the left colon likely from diverticulosis    although the possibility of ischemia has been raised. 2. Osteoporosis. 3. Hypertension. 4. Status post hysterectomy. 5. Hypercholesterolemia. 6. Anxiety.  HISTORY OF PRESENT ILLNESS:  The patient is a 77 year old female referred to my office by Dr. Patty Sermons for lower abdominal pain, constipation, and weight loss.  In the office, she was discovered to have a rectal mass and relatively severe discomfort.  In addition, she had near-syncopal episodes when trying to defecate.  For these reasons, she was admitted to the hospital for probable colonoscopy and required preparation.  ADMISSION PHYSICAL EXAMINATION:  She was afebrile with a blood pressure 110/80, pulse 96.  Pertinent findings included anicteric sclerae, clear chest, regular heart sounds, abdomen was soft with normal bowel sounds and without mass or organomegaly.  There was mild to moderate tenderness to deep palpation in both lower quadrants and especially in the suprapubic area there was a large anterior left rectal mass.  HOSPITAL COURSE:  The patient underwent colonoscopy following preparation the following day.  The rectal mass was not seen clearly and apparently was submucosal.  At 35 cm the lumen was nearly completely obstructed from a more proximal lesion or inflammation.  The etiology of which could not be determined visually.  The scope would not pass further.  Biopsies taken at that site suggested ischemia.  The patient underwent an  abdominal CT scan which was read as showing severe diverticulitis which was unusual in that she had no fever or white count.  She was treated with intravenous ciprofloxacin and Flagyl with gradual improvement.  Laboratory tests on discharge revealed a hemoglobin of 11.2 and a white blood count of 6.7.  Electrolytes were normal. Blood sugar 110, BUN 3, creatinine 0.9.  Liver function tests were normal. CEA level was normal at 0.5.  CA125 antigen level was normal at 5.3.  By September 02, 2001, the patient was relatively asymptomatic, her diet had been advanced to soft regular diet which she was tolerating well without further nausea or abdominal pain.  She was discharged to be followed up in the office within 2 weeks.  Repeat attempt at colonoscopy will take place in 1 month. She was discharged on the following medications.  DISCHARGE MEDICATIONS: 1. Ciprofloxacin 500 mg b.i.d. x5 days. 2. Metronidazole 250 mg q.i.d. x5 days. 3. Lisinopril 10 mg per day. 4. Actonel 35 mg per week. 5. Advicor unknown dose at home. 6. Xanax p.r.n. unknown dose at home. 7. Calcium supplements b.i.d. 8. Vitamin E.  CONDITION ON DISCHARGE:  Improved.  DIET:  Low residue, low fiber for 1 week then resume high fiber diet.  HOME HEALTH:  Home health aide has been provided through the weekend. Dictated by:   Roosvelt Harps, M.D. Attending Physician:  Mingo Amber DD:  09/02/01 TD:  09/04/01 Job: [id number]

## 2010-08-16 NOTE — H&P (Signed)
[facility]  Patient:    [name] [name], [name] [name] Visit Number: [id number] MRN: [id number]          Service Type: MED Location: 3W 0357 01 Attending Physician:  Mingo Amber Dictated by:   Roosvelt Harps, M.D. Admit Date:  08/26/2001   CC:         Thomas A. Patty Sermons, M.D.   History and Physical  DATE OF BIRTH:  1923-02-26  HISTORY OF PRESENT ILLNESS:  The patient is a 77 year old female referred to my office by Dr. Patty Sermons for lower abdominal pain, constipation, and weight loss.  She apparently has been having some vague abdominal symptoms which were initially thought to be upper GI.  She had been taking Fosamax, which was discontinued, and she was placed on Nexium and then Protonix for a brief period, but she is not taking them any longer.  The pain is not really in the epigastrium, but it is in the lower quadrant.  She has been avoiding eating because bowel movements are so painful that she does not want to trigger one. She does not have pain in the rectum.  She locates the discomfort as a band across the low abdomen and suprapubic area.  She has been in pain for at least a week.  She said she may have had some chills, but she is not having any fevers, hematochezia, or melena.  She has never had upper or lower endoscopy or any colon cancer screening.  She says she has lost at least six pounds in the past week.  She gets heartburn several days a week and has vague intermittent sensation that the food does not go down well.  PAST MEDICAL HISTORY: 1. Osteoporosis. 2. Hypertension.  PAST SURGICAL HISTORY: 1. Hysterectomy in 1973. 2. Benign breast lump removal.  CURRENT MEDICATIONS: 1. Lisinopril 10 mg q.d. 2. Actonel 35 mg weekly. 3. Advicor q.h.s., dose unknown. 4. Xanax p.r.n., dose unknown. 5. Calcium twice daily, dose unknown. 6. Vitamin E daily, dose unknown.  ALLERGIES:  CODEINE.  FAMILY HISTORY:  Pertinent for ulcers in her  brother and a sister who had colonic cancer and polyps.  SOCIAL HISTORY:  She is widowed and retired.  Nonsmoker, nondrinker.  REVIEW OF SYSTEMS:  GENERAL:  Acute weight loss of five or six pounds in the last week.  No fevers but complains of chills.  SKIN:  No rash or pruritus. EYES:  No icterus or change in vision.  ENT:  No aphthous ulcers or chronic sore throat.  RESPIRATORY:  No shortness of breath, cough, or wheezing. CARDIAC:  No chest pain.  Occasional palpitations.  No known valvular heart disease.  GENITOURINARY:  No dysuria or hematuria.  GASTROINTESTINAL:  As above.  Remainder of review of systems is negative.  PHYSICAL EXAMINATION:  GENERAL:  Well-developed, well-nourished, elderly female in mild discomfort.  VITAL SIGNS:  Afebrile.  Weight 125.  Blood pressure 110/80, pulse 96 and regular.  SKIN:  Normal.  HEENT:  Eyes are anicteric.  Oropharynx is unremarkable, without aphthous ulcers.  NECK:  Supple.  Without thyromegaly or adenopathy.  CHEST:  Sounds clear.  HEART:  Regular rate and rhythm.  Without murmurs, rubs, or gallops.  ABDOMEN:  Soft, with normal bowel sounds and without mass or organomegaly. There is mild to moderate tenderness to deep palpation in both lower quadrants and especially in the suprapubic area.  RECTAL:  Reveals no external lesions.  There is a large anterior rectal mass.  EXTREMITIES:  Without clubbing, cyanosis, or edema.  Femoral and dorsalis pedis pulses are 1+ bilaterally.  IMPRESSION:  The patient is a 77 year old female with abdominal pain, weight loss, and a rectal mass on physical examination.  I strongly suspect she has advanced colorectal cancer but will rule out other etiologies for this abnormal physical finding.  PLAN:  Since she lives alone at home and is in discomfort and is concerned about prepping for colonoscopy due to the fact that she has such severe rectal pain and almost passed out in the last few days from  this discomfort, I am admitting her to the hospital for IV hydration in preparation for colonoscopy tomorrow.  Please see the orders. Dictated by:   Roosvelt Harps, M.D. Attending Physician:  Mingo Amber DD:  08/26/01 TD:  08/27/01 Job: [id number]

## 2010-09-24 ENCOUNTER — Encounter: Payer: Self-pay | Admitting: Cardiology

## 2010-10-21 ENCOUNTER — Telehealth: Payer: Self-pay | Admitting: Cardiology

## 2010-10-21 NOTE — Telephone Encounter (Signed)
I faxed all the relative information available to [facility] (attn: Merlene Laughter) (fax: [phone]

## 2010-10-30 NOTE — Telephone Encounter (Signed)
Already faxed.

## 2010-12-04 ENCOUNTER — Encounter: Payer: Self-pay | Admitting: *Deleted

## 2010-12-04 DIAGNOSIS — S72009A Fracture of unspecified part of neck of unspecified femur, initial encounter for closed fracture: Secondary | ICD-10-CM | POA: Insufficient documentation

## 2010-12-04 DIAGNOSIS — Z8659 Personal history of other mental and behavioral disorders: Secondary | ICD-10-CM | POA: Insufficient documentation

## 2010-12-04 DIAGNOSIS — I4510 Unspecified right bundle-branch block: Secondary | ICD-10-CM | POA: Insufficient documentation

## 2010-12-04 DIAGNOSIS — Z87898 Personal history of other specified conditions: Secondary | ICD-10-CM | POA: Insufficient documentation

## 2010-12-04 DIAGNOSIS — Z8719 Personal history of other diseases of the digestive system: Secondary | ICD-10-CM | POA: Insufficient documentation

## 2010-12-04 DIAGNOSIS — M1990 Unspecified osteoarthritis, unspecified site: Secondary | ICD-10-CM | POA: Insufficient documentation

## 2010-12-10 ENCOUNTER — Other Ambulatory Visit: Payer: Self-pay | Admitting: Cardiology

## 2010-12-10 DIAGNOSIS — E785 Hyperlipidemia, unspecified: Secondary | ICD-10-CM

## 2010-12-10 DIAGNOSIS — I10 Essential (primary) hypertension: Secondary | ICD-10-CM

## 2010-12-18 ENCOUNTER — Other Ambulatory Visit: Payer: Medicare Other | Admitting: *Deleted

## 2010-12-18 ENCOUNTER — Ambulatory Visit: Payer: Medicare Other | Admitting: Nurse Practitioner

## 2010-12-27 ENCOUNTER — Emergency Department (HOSPITAL_COMMUNITY): Payer: Medicare Other

## 2010-12-27 ENCOUNTER — Emergency Department (HOSPITAL_COMMUNITY)
Admission: EM | Admit: 2010-12-27 | Discharge: 2010-12-27 | Disposition: A | Discharge status: Home / Self Care | Payer: Medicare Other | Attending: Emergency Medicine | Admitting: Emergency Medicine

## 2010-12-27 DIAGNOSIS — I10 Essential (primary) hypertension: Secondary | ICD-10-CM | POA: Insufficient documentation

## 2010-12-27 DIAGNOSIS — K5730 Diverticulosis of large intestine without perforation or abscess without bleeding: Secondary | ICD-10-CM | POA: Insufficient documentation

## 2010-12-27 DIAGNOSIS — R10814 Left lower quadrant abdominal tenderness: Secondary | ICD-10-CM | POA: Insufficient documentation

## 2010-12-27 DIAGNOSIS — R112 Nausea with vomiting, unspecified: Secondary | ICD-10-CM | POA: Insufficient documentation

## 2010-12-27 DIAGNOSIS — N8111 Cystocele, midline: Secondary | ICD-10-CM | POA: Insufficient documentation

## 2010-12-27 DIAGNOSIS — R109 Unspecified abdominal pain: Secondary | ICD-10-CM | POA: Insufficient documentation

## 2010-12-27 LAB — COMPREHENSIVE METABOLIC PANEL
ALT: 17 U/L (ref 0–35)
Albumin: 4.1 g/dL (ref 3.5–5.2)
Alkaline Phosphatase: 56 U/L (ref 39–117)
BUN: 11 mg/dL (ref 6–23)
Chloride: 103 mEq/L (ref 96–112)
Potassium: 4.1 mEq/L (ref 3.5–5.1)
Sodium: 140 mEq/L (ref 135–145)
Total Bilirubin: 0.4 mg/dL (ref 0.3–1.2)
Total Protein: 7.5 g/dL (ref 6.0–8.3)

## 2010-12-27 LAB — URINALYSIS, ROUTINE W REFLEX MICROSCOPIC
Bilirubin Urine: NEGATIVE
Glucose, UA: NEGATIVE mg/dL
Hgb urine dipstick: NEGATIVE
Ketones, ur: NEGATIVE mg/dL
Protein, ur: NEGATIVE mg/dL
pH: 7.5 (ref 5.0–8.0)

## 2010-12-27 LAB — URINE MICROSCOPIC-ADD ON

## 2010-12-27 LAB — CBC
HCT: 42.9 % (ref 36.0–46.0)
Hemoglobin: 14.4 g/dL (ref 12.0–15.0)
MCHC: 33.6 g/dL (ref 30.0–36.0)
RDW: 13.3 % (ref 11.5–15.5)
WBC: 8.3 10*3/uL (ref 4.0–10.5)

## 2010-12-27 LAB — DIFFERENTIAL
Basophils Absolute: 0 10*3/uL (ref 0.0–0.1)
Basophils Relative: 0 % (ref 0–1)
Eosinophils Relative: 1 % (ref 0–5)
Lymphocytes Relative: 15 % (ref 12–46)
Neutro Abs: 6.5 10*3/uL (ref 1.7–7.7)

## 2010-12-27 MED ORDER — IOHEXOL 300 MG/ML  SOLN
100.0000 mL | Freq: Once | INTRAMUSCULAR | Status: AC | PRN
  Administered 2010-12-27: 100 mL via INTRAVENOUS

## 2010-12-29 LAB — URINE CULTURE: Colony Count: 2000

## 2010-12-31 LAB — POCT I-STAT, CHEM 8
BUN: 17
Calcium, Ion: 1.23
Chloride: 105
Creatinine, Ser: 1.1
Glucose, Bld: 107 — ABNORMAL HIGH
HCT: 41
Hemoglobin: 13.9
Potassium: 3.7
Sodium: 142
TCO2: 26

## 2010-12-31 LAB — CBC
HCT: 23.2 — ABNORMAL LOW
HCT: 24.6 — ABNORMAL LOW
HCT: 31.4 — ABNORMAL LOW
HCT: 39.9
Hemoglobin: 10.5 — ABNORMAL LOW
Hemoglobin: 11.2 — ABNORMAL LOW
Hemoglobin: 13.4
Hemoglobin: 7.7 — CL
MCHC: 33.7
MCHC: 34
MCHC: 34.1
MCV: 88.8
MCV: 89.1
MCV: 89.6
MCV: 89.7
MCV: 89.8
Platelets: 153
Platelets: 228
RBC: 3.71 — ABNORMAL LOW
RBC: 4.49
RDW: 12.8
RDW: 13
RDW: 13.4
WBC: 10.1
WBC: 6.5
WBC: 7.8

## 2010-12-31 LAB — BASIC METABOLIC PANEL
BUN: 6
CO2: 28
CO2: 29
Calcium: 8.7
Chloride: 108
Chloride: 109
Chloride: 109
Creatinine, Ser: 0.64
GFR calc Af Amer: >60
GFR calc non Af Amer: >60
GFR calc non Af Amer: >60
Glucose, Bld: 113 — ABNORMAL HIGH
Glucose, Bld: 125 — ABNORMAL HIGH
Glucose, Bld: 129 — ABNORMAL HIGH
Potassium: 3.4 — ABNORMAL LOW
Potassium: 3.7
Potassium: 4.2
Sodium: 137
Sodium: 139
Sodium: 144

## 2010-12-31 LAB — URINE CULTURE
Colony Count: NO GROWTH
Culture: NO GROWTH

## 2010-12-31 LAB — DIFFERENTIAL
Basophils Absolute: 0
Basophils Relative: 0
Eosinophils Absolute: 0.2
Eosinophils Relative: 2
Monocytes Absolute: 0.8
Monocytes Relative: 8
Neutro Abs: 7.6

## 2010-12-31 LAB — PROTIME-INR
INR: 0.9
INR: 2 — ABNORMAL HIGH
Prothrombin Time: 12.6
Prothrombin Time: 13.8
Prothrombin Time: 23.8 — ABNORMAL HIGH

## 2010-12-31 LAB — CROSSMATCH: ABO/RH(D): B POS

## 2010-12-31 LAB — URINALYSIS, ROUTINE W REFLEX MICROSCOPIC
Bilirubin Urine: NEGATIVE
Glucose, UA: NEGATIVE
Hgb urine dipstick: NEGATIVE
Protein, ur: NEGATIVE
pH: 7

## 2010-12-31 LAB — B-NATRIURETIC PEPTIDE (CONVERTED LAB): Pro B Natriuretic peptide (BNP): 120 — ABNORMAL HIGH

## 2010-12-31 LAB — APTT
aPTT: 27
aPTT: 30

## 2010-12-31 LAB — GLUCOSE, CAPILLARY

## 2011-01-09 LAB — LIPASE, BLOOD
Lipase: 50
Lipase: 75 — ABNORMAL HIGH
Lipase: >2000 — ABNORMAL HIGH

## 2011-01-09 LAB — AMYLASE
Amylase: 2355 — ABNORMAL HIGH
Amylase: 686 — ABNORMAL HIGH

## 2011-01-09 LAB — COMPREHENSIVE METABOLIC PANEL
ALT: 37 — ABNORMAL HIGH
ALT: 45 — ABNORMAL HIGH
ALT: 61 — ABNORMAL HIGH
ALT: 89 — ABNORMAL HIGH
ALT: 97 — ABNORMAL HIGH
AST: 177 — ABNORMAL HIGH
AST: 38 — ABNORMAL HIGH
AST: 63 — ABNORMAL HIGH
Albumin: 2.6 — ABNORMAL LOW
Albumin: 2.6 — ABNORMAL LOW
Alkaline Phosphatase: 41
Alkaline Phosphatase: 46
BUN: 10
CO2: 18 — ABNORMAL LOW
CO2: 20
CO2: 25
CO2: 25
Calcium: 10.2
Calcium: 7.4 — ABNORMAL LOW
Calcium: 7.5 — ABNORMAL LOW
Calcium: 7.9 — ABNORMAL LOW
Calcium: 8.1 — ABNORMAL LOW
Chloride: 106
Chloride: 108
Chloride: 110
Creatinine, Ser: 0.59
Creatinine, Ser: 0.64
Creatinine, Ser: 0.7
Creatinine, Ser: 0.83
GFR calc Af Amer: >60
GFR calc Af Amer: >60
GFR calc Af Amer: >60
GFR calc non Af Amer: >60
GFR calc non Af Amer: >60
GFR calc non Af Amer: >60
GFR calc non Af Amer: >60
Glucose, Bld: 101 — ABNORMAL HIGH
Glucose, Bld: 75
Glucose, Bld: 94
Glucose, Bld: 99
Potassium: 3.4 — ABNORMAL LOW
Potassium: 3.7
Sodium: 135
Sodium: 139
Sodium: 141
Sodium: 141
Total Bilirubin: 1.1
Total Bilirubin: 1.3 — ABNORMAL HIGH
Total Protein: 5 — ABNORMAL LOW
Total Protein: 5.5 — ABNORMAL LOW
Total Protein: 5.5 — ABNORMAL LOW
Total Protein: 6.6

## 2011-01-09 LAB — URINALYSIS, ROUTINE W REFLEX MICROSCOPIC
Bilirubin Urine: NEGATIVE
Hgb urine dipstick: NEGATIVE
Nitrite: NEGATIVE
Protein, ur: NEGATIVE
Specific Gravity, Urine: 1.01
Urobilinogen, UA: 0.2

## 2011-01-09 LAB — CBC
HCT: 29.9 — ABNORMAL LOW
Hemoglobin: 10.3 — ABNORMAL LOW
Hemoglobin: 10.6 — ABNORMAL LOW
Hemoglobin: 11.8 — ABNORMAL LOW
Hemoglobin: 12.1
MCHC: 34
MCHC: 34.2
MCHC: 34.4
MCHC: 34.5
MCV: 87.7
MCV: 88.3
MCV: 88.4
Platelets: 173
Platelets: 237
RBC: 3.49 — ABNORMAL LOW
RBC: 3.68 — ABNORMAL LOW
RBC: 4.04
RDW: 13.4
RDW: 13.5
RDW: 13.7
WBC: 10.6 — ABNORMAL HIGH
WBC: 7.8
WBC: 8.5
WBC: 9.7

## 2011-01-09 LAB — LIPID PANEL
Triglycerides: 75
VLDL: 15

## 2011-01-09 LAB — DIFFERENTIAL
Eosinophils Absolute: 0.1
Eosinophils Relative: 1
Lymphocytes Relative: 18
Lymphs Abs: 2.3
Monocytes Relative: 6
Neutrophils Relative %: 75

## 2011-01-09 LAB — PROTIME-INR
INR: 0.9
Prothrombin Time: 12.1

## 2011-01-09 LAB — POCT CARDIAC MARKERS
CKMB, poc: <1 — ABNORMAL LOW
Troponin i, poc: <0.05

## 2011-01-09 LAB — GASTRIC OCCULT BLOOD (1-CARD TO LAB): pH, Gastric: 3

## 2011-01-09 LAB — APTT: aPTT: 24

## 2011-02-11 ENCOUNTER — Ambulatory Visit: Payer: Medicare Other | Attending: Geriatric Medicine | Admitting: Physical Therapy

## 2011-02-11 DIAGNOSIS — H8110 Benign paroxysmal vertigo, unspecified ear: Secondary | ICD-10-CM | POA: Insufficient documentation

## 2011-02-11 DIAGNOSIS — R269 Unspecified abnormalities of gait and mobility: Secondary | ICD-10-CM | POA: Insufficient documentation

## 2011-02-11 DIAGNOSIS — IMO0001 Reserved for inherently not codable concepts without codable children: Secondary | ICD-10-CM | POA: Insufficient documentation

## 2011-02-14 ENCOUNTER — Ambulatory Visit: Payer: Medicare Other | Admitting: Physical Therapy

## 2011-07-30 DIAGNOSIS — R05 Cough: Secondary | ICD-10-CM | POA: Diagnosis not present

## 2011-07-30 DIAGNOSIS — J209 Acute bronchitis, unspecified: Secondary | ICD-10-CM | POA: Diagnosis not present

## 2011-08-11 DIAGNOSIS — Z79899 Other long term (current) drug therapy: Secondary | ICD-10-CM | POA: Diagnosis not present

## 2011-08-11 DIAGNOSIS — I10 Essential (primary) hypertension: Secondary | ICD-10-CM | POA: Diagnosis not present

## 2011-08-11 DIAGNOSIS — E7800 Pure hypercholesterolemia, unspecified: Secondary | ICD-10-CM | POA: Diagnosis not present

## 2011-08-11 DIAGNOSIS — Z1331 Encounter for screening for depression: Secondary | ICD-10-CM | POA: Diagnosis not present

## 2011-08-11 DIAGNOSIS — Z0000 Encounter for general adult medical examination without abnormal findings: Secondary | ICD-10-CM | POA: Diagnosis not present

## 2011-12-24 ENCOUNTER — Other Ambulatory Visit: Payer: Self-pay | Admitting: Geriatric Medicine

## 2011-12-24 DIAGNOSIS — R5381 Other malaise: Secondary | ICD-10-CM | POA: Diagnosis not present

## 2011-12-24 DIAGNOSIS — R5383 Other fatigue: Secondary | ICD-10-CM | POA: Diagnosis not present

## 2011-12-24 DIAGNOSIS — R42 Dizziness and giddiness: Secondary | ICD-10-CM | POA: Diagnosis not present

## 2011-12-24 DIAGNOSIS — Z733 Stress, not elsewhere classified: Secondary | ICD-10-CM | POA: Diagnosis not present

## 2011-12-24 DIAGNOSIS — R1032 Left lower quadrant pain: Secondary | ICD-10-CM

## 2011-12-24 DIAGNOSIS — R110 Nausea: Secondary | ICD-10-CM | POA: Diagnosis not present

## 2011-12-24 DIAGNOSIS — R3911 Hesitancy of micturition: Secondary | ICD-10-CM | POA: Diagnosis not present

## 2011-12-24 DIAGNOSIS — K5900 Constipation, unspecified: Secondary | ICD-10-CM | POA: Diagnosis not present

## 2011-12-26 ENCOUNTER — Ambulatory Visit
Admission: RE | Admit: 2011-12-26 | Discharge: 2011-12-26 | Disposition: A | Discharge status: Home / Self Care | Payer: Medicare Other | Source: Ambulatory Visit | Attending: Geriatric Medicine | Admitting: Geriatric Medicine

## 2011-12-26 DIAGNOSIS — R1032 Left lower quadrant pain: Secondary | ICD-10-CM

## 2011-12-26 DIAGNOSIS — K449 Diaphragmatic hernia without obstruction or gangrene: Secondary | ICD-10-CM | POA: Diagnosis not present

## 2011-12-26 DIAGNOSIS — K5730 Diverticulosis of large intestine without perforation or abscess without bleeding: Secondary | ICD-10-CM | POA: Diagnosis not present

## 2011-12-26 MED ORDER — IOHEXOL 300 MG/ML  SOLN
100.0000 mL | Freq: Once | INTRAMUSCULAR | Status: AC | PRN
  Administered 2011-12-26: 100 mL via INTRAVENOUS

## 2012-01-08 DIAGNOSIS — Z23 Encounter for immunization: Secondary | ICD-10-CM | POA: Diagnosis not present

## 2012-02-09 DIAGNOSIS — I10 Essential (primary) hypertension: Secondary | ICD-10-CM | POA: Diagnosis not present

## 2012-02-09 DIAGNOSIS — R51 Headache: Secondary | ICD-10-CM | POA: Diagnosis not present

## 2012-03-15 ENCOUNTER — Ambulatory Visit (INDEPENDENT_AMBULATORY_CARE_PROVIDER_SITE_OTHER): Payer: Medicare Other | Admitting: Psychology

## 2012-03-15 DIAGNOSIS — F411 Generalized anxiety disorder: Secondary | ICD-10-CM | POA: Diagnosis not present

## 2012-07-07 DIAGNOSIS — K5732 Diverticulitis of large intestine without perforation or abscess without bleeding: Secondary | ICD-10-CM | POA: Diagnosis not present

## 2012-07-07 DIAGNOSIS — D179 Benign lipomatous neoplasm, unspecified: Secondary | ICD-10-CM | POA: Diagnosis not present

## 2012-07-07 DIAGNOSIS — K5900 Constipation, unspecified: Secondary | ICD-10-CM | POA: Diagnosis not present

## 2012-07-07 DIAGNOSIS — I10 Essential (primary) hypertension: Secondary | ICD-10-CM | POA: Diagnosis not present

## 2012-08-17 DIAGNOSIS — Z0000 Encounter for general adult medical examination without abnormal findings: Secondary | ICD-10-CM | POA: Diagnosis not present

## 2012-08-17 DIAGNOSIS — R109 Unspecified abdominal pain: Secondary | ICD-10-CM | POA: Diagnosis not present

## 2012-08-17 DIAGNOSIS — Z1331 Encounter for screening for depression: Secondary | ICD-10-CM | POA: Diagnosis not present

## 2013-01-03 DIAGNOSIS — Z23 Encounter for immunization: Secondary | ICD-10-CM | POA: Diagnosis not present

## 2013-01-28 DIAGNOSIS — M545 Low back pain, unspecified: Secondary | ICD-10-CM | POA: Diagnosis not present

## 2013-01-28 DIAGNOSIS — N390 Urinary tract infection, site not specified: Secondary | ICD-10-CM | POA: Diagnosis not present

## 2013-01-28 DIAGNOSIS — M5430 Sciatica, unspecified side: Secondary | ICD-10-CM | POA: Diagnosis not present

## 2013-02-08 ENCOUNTER — Other Ambulatory Visit: Payer: Self-pay | Admitting: Geriatric Medicine

## 2013-02-08 ENCOUNTER — Ambulatory Visit
Admission: RE | Admit: 2013-02-08 | Discharge: 2013-02-08 | Disposition: A | Discharge status: Home / Self Care | Payer: Medicare Other | Source: Ambulatory Visit | Attending: Geriatric Medicine | Admitting: Geriatric Medicine

## 2013-02-08 DIAGNOSIS — I10 Essential (primary) hypertension: Secondary | ICD-10-CM | POA: Diagnosis not present

## 2013-02-08 DIAGNOSIS — M549 Dorsalgia, unspecified: Secondary | ICD-10-CM

## 2013-02-08 DIAGNOSIS — Z79899 Other long term (current) drug therapy: Secondary | ICD-10-CM | POA: Diagnosis not present

## 2013-02-08 DIAGNOSIS — S32009A Unspecified fracture of unspecified lumbar vertebra, initial encounter for closed fracture: Secondary | ICD-10-CM | POA: Diagnosis not present

## 2013-02-09 ENCOUNTER — Other Ambulatory Visit: Payer: Self-pay | Admitting: Geriatric Medicine

## 2013-02-09 DIAGNOSIS — IMO0002 Reserved for concepts with insufficient information to code with codable children: Secondary | ICD-10-CM

## 2013-02-09 DIAGNOSIS — M545 Low back pain, unspecified: Secondary | ICD-10-CM

## 2013-02-12 ENCOUNTER — Ambulatory Visit
Admission: RE | Admit: 2013-02-12 | Discharge: 2013-02-12 | Disposition: A | Discharge status: Home / Self Care | Payer: Medicare Other | Source: Ambulatory Visit | Attending: Geriatric Medicine | Admitting: Geriatric Medicine

## 2013-02-12 DIAGNOSIS — M545 Low back pain, unspecified: Secondary | ICD-10-CM

## 2013-02-12 DIAGNOSIS — IMO0002 Reserved for concepts with insufficient information to code with codable children: Secondary | ICD-10-CM

## 2013-02-12 DIAGNOSIS — M48061 Spinal stenosis, lumbar region without neurogenic claudication: Secondary | ICD-10-CM | POA: Diagnosis not present

## 2013-06-02 DIAGNOSIS — K5900 Constipation, unspecified: Secondary | ICD-10-CM | POA: Diagnosis not present

## 2013-06-02 DIAGNOSIS — M76899 Other specified enthesopathies of unspecified lower limb, excluding foot: Secondary | ICD-10-CM | POA: Diagnosis not present

## 2013-06-04 DIAGNOSIS — M76899 Other specified enthesopathies of unspecified lower limb, excluding foot: Secondary | ICD-10-CM | POA: Diagnosis not present

## 2013-07-08 DIAGNOSIS — Z23 Encounter for immunization: Secondary | ICD-10-CM | POA: Diagnosis not present

## 2013-08-11 DIAGNOSIS — F329 Major depressive disorder, single episode, unspecified: Secondary | ICD-10-CM | POA: Diagnosis not present

## 2013-08-11 DIAGNOSIS — F3289 Other specified depressive episodes: Secondary | ICD-10-CM | POA: Diagnosis not present

## 2013-08-11 DIAGNOSIS — I10 Essential (primary) hypertension: Secondary | ICD-10-CM | POA: Diagnosis not present

## 2013-08-11 DIAGNOSIS — M67919 Unspecified disorder of synovium and tendon, unspecified shoulder: Secondary | ICD-10-CM | POA: Diagnosis not present

## 2013-08-11 DIAGNOSIS — R110 Nausea: Secondary | ICD-10-CM | POA: Diagnosis not present

## 2013-08-19 DIAGNOSIS — Z0000 Encounter for general adult medical examination without abnormal findings: Secondary | ICD-10-CM | POA: Diagnosis not present

## 2013-08-19 DIAGNOSIS — Z79899 Other long term (current) drug therapy: Secondary | ICD-10-CM | POA: Diagnosis not present

## 2013-08-19 DIAGNOSIS — Z1331 Encounter for screening for depression: Secondary | ICD-10-CM | POA: Diagnosis not present

## 2013-08-19 DIAGNOSIS — N183 Chronic kidney disease, stage 3 unspecified: Secondary | ICD-10-CM | POA: Diagnosis not present

## 2013-08-19 DIAGNOSIS — I129 Hypertensive chronic kidney disease with stage 1 through stage 4 chronic kidney disease, or unspecified chronic kidney disease: Secondary | ICD-10-CM | POA: Diagnosis not present

## 2013-08-19 DIAGNOSIS — E7800 Pure hypercholesterolemia, unspecified: Secondary | ICD-10-CM | POA: Diagnosis not present

## 2013-08-25 DIAGNOSIS — M949 Disorder of cartilage, unspecified: Secondary | ICD-10-CM | POA: Diagnosis not present

## 2013-08-25 DIAGNOSIS — M899 Disorder of bone, unspecified: Secondary | ICD-10-CM | POA: Diagnosis not present

## 2013-09-13 DIAGNOSIS — M810 Age-related osteoporosis without current pathological fracture: Secondary | ICD-10-CM | POA: Diagnosis not present

## 2013-09-13 DIAGNOSIS — I10 Essential (primary) hypertension: Secondary | ICD-10-CM | POA: Diagnosis not present

## 2013-09-13 DIAGNOSIS — M25519 Pain in unspecified shoulder: Secondary | ICD-10-CM | POA: Diagnosis not present

## 2013-10-03 DIAGNOSIS — M25519 Pain in unspecified shoulder: Secondary | ICD-10-CM | POA: Diagnosis not present

## 2013-12-20 DIAGNOSIS — R1084 Generalized abdominal pain: Secondary | ICD-10-CM | POA: Diagnosis not present

## 2013-12-20 DIAGNOSIS — K5900 Constipation, unspecified: Secondary | ICD-10-CM | POA: Diagnosis not present

## 2013-12-21 DIAGNOSIS — M810 Age-related osteoporosis without current pathological fracture: Secondary | ICD-10-CM | POA: Diagnosis not present

## 2013-12-26 DIAGNOSIS — Z23 Encounter for immunization: Secondary | ICD-10-CM | POA: Diagnosis not present

## 2014-02-20 DIAGNOSIS — R079 Chest pain, unspecified: Secondary | ICD-10-CM | POA: Diagnosis not present

## 2014-02-20 DIAGNOSIS — N183 Chronic kidney disease, stage 3 (moderate): Secondary | ICD-10-CM | POA: Diagnosis not present

## 2014-02-20 DIAGNOSIS — I10 Essential (primary) hypertension: Secondary | ICD-10-CM | POA: Diagnosis not present

## 2014-02-20 DIAGNOSIS — R0602 Shortness of breath: Secondary | ICD-10-CM | POA: Diagnosis not present

## 2014-03-01 DIAGNOSIS — R079 Chest pain, unspecified: Secondary | ICD-10-CM | POA: Diagnosis not present

## 2014-03-01 DIAGNOSIS — E780 Pure hypercholesterolemia: Secondary | ICD-10-CM | POA: Diagnosis not present

## 2014-03-01 DIAGNOSIS — N182 Chronic kidney disease, stage 2 (mild): Secondary | ICD-10-CM | POA: Diagnosis not present

## 2014-03-01 DIAGNOSIS — R0602 Shortness of breath: Secondary | ICD-10-CM | POA: Diagnosis not present

## 2014-03-06 DIAGNOSIS — I10 Essential (primary) hypertension: Secondary | ICD-10-CM | POA: Diagnosis not present

## 2014-03-06 DIAGNOSIS — R0789 Other chest pain: Secondary | ICD-10-CM | POA: Diagnosis not present

## 2014-03-06 DIAGNOSIS — R0602 Shortness of breath: Secondary | ICD-10-CM | POA: Diagnosis not present

## 2014-03-06 DIAGNOSIS — E780 Pure hypercholesterolemia: Secondary | ICD-10-CM | POA: Diagnosis not present

## 2014-03-09 DIAGNOSIS — R0602 Shortness of breath: Secondary | ICD-10-CM | POA: Diagnosis not present

## 2014-03-09 DIAGNOSIS — E780 Pure hypercholesterolemia: Secondary | ICD-10-CM | POA: Diagnosis not present

## 2014-03-09 DIAGNOSIS — I10 Essential (primary) hypertension: Secondary | ICD-10-CM | POA: Diagnosis not present

## 2014-03-09 DIAGNOSIS — R079 Chest pain, unspecified: Secondary | ICD-10-CM | POA: Diagnosis not present

## 2014-03-21 DIAGNOSIS — R5381 Other malaise: Secondary | ICD-10-CM | POA: Diagnosis not present

## 2014-03-21 DIAGNOSIS — R079 Chest pain, unspecified: Secondary | ICD-10-CM | POA: Diagnosis not present

## 2014-03-21 DIAGNOSIS — E782 Mixed hyperlipidemia: Secondary | ICD-10-CM | POA: Diagnosis not present

## 2014-03-21 DIAGNOSIS — I4510 Unspecified right bundle-branch block: Secondary | ICD-10-CM | POA: Diagnosis not present

## 2014-07-07 ENCOUNTER — Emergency Department (HOSPITAL_COMMUNITY): Payer: Medicare Other

## 2014-07-07 ENCOUNTER — Encounter (HOSPITAL_COMMUNITY): Payer: Self-pay | Admitting: Emergency Medicine

## 2014-07-07 ENCOUNTER — Inpatient Hospital Stay (HOSPITAL_COMMUNITY)
Admission: EM | Admit: 2014-07-07 | Discharge: 2014-07-10 | DRG: 690 | Disposition: A | Discharge status: Home Health | Payer: Medicare Other | Attending: Internal Medicine | Admitting: Internal Medicine

## 2014-07-07 DIAGNOSIS — K224 Dyskinesia of esophagus: Secondary | ICD-10-CM | POA: Diagnosis present

## 2014-07-07 DIAGNOSIS — M1990 Unspecified osteoarthritis, unspecified site: Secondary | ICD-10-CM | POA: Diagnosis present

## 2014-07-07 DIAGNOSIS — I739 Peripheral vascular disease, unspecified: Secondary | ICD-10-CM | POA: Diagnosis not present

## 2014-07-07 DIAGNOSIS — K449 Diaphragmatic hernia without obstruction or gangrene: Secondary | ICD-10-CM | POA: Diagnosis present

## 2014-07-07 DIAGNOSIS — N390 Urinary tract infection, site not specified: Secondary | ICD-10-CM | POA: Diagnosis not present

## 2014-07-07 DIAGNOSIS — R112 Nausea with vomiting, unspecified: Secondary | ICD-10-CM | POA: Diagnosis present

## 2014-07-07 DIAGNOSIS — Z800 Family history of malignant neoplasm of digestive organs: Secondary | ICD-10-CM | POA: Diagnosis not present

## 2014-07-07 DIAGNOSIS — K219 Gastro-esophageal reflux disease without esophagitis: Secondary | ICD-10-CM | POA: Diagnosis present

## 2014-07-07 DIAGNOSIS — I10 Essential (primary) hypertension: Secondary | ICD-10-CM | POA: Diagnosis present

## 2014-07-07 DIAGNOSIS — R51 Headache: Secondary | ICD-10-CM | POA: Diagnosis not present

## 2014-07-07 DIAGNOSIS — E785 Hyperlipidemia, unspecified: Secondary | ICD-10-CM | POA: Diagnosis present

## 2014-07-07 DIAGNOSIS — K5790 Diverticulosis of intestine, part unspecified, without perforation or abscess without bleeding: Secondary | ICD-10-CM | POA: Diagnosis present

## 2014-07-07 DIAGNOSIS — I4510 Unspecified right bundle-branch block: Secondary | ICD-10-CM | POA: Diagnosis present

## 2014-07-07 DIAGNOSIS — Z66 Do not resuscitate: Secondary | ICD-10-CM | POA: Diagnosis present

## 2014-07-07 DIAGNOSIS — Z9049 Acquired absence of other specified parts of digestive tract: Secondary | ICD-10-CM | POA: Diagnosis present

## 2014-07-07 DIAGNOSIS — G44229 Chronic tension-type headache, not intractable: Secondary | ICD-10-CM | POA: Diagnosis present

## 2014-07-07 DIAGNOSIS — K589 Irritable bowel syndrome without diarrhea: Secondary | ICD-10-CM | POA: Diagnosis present

## 2014-07-07 DIAGNOSIS — R1110 Vomiting, unspecified: Secondary | ICD-10-CM | POA: Diagnosis not present

## 2014-07-07 DIAGNOSIS — Z7982 Long term (current) use of aspirin: Secondary | ICD-10-CM

## 2014-07-07 DIAGNOSIS — E860 Dehydration: Secondary | ICD-10-CM | POA: Diagnosis not present

## 2014-07-07 DIAGNOSIS — Z888 Allergy status to other drugs, medicaments and biological substances status: Secondary | ICD-10-CM

## 2014-07-07 DIAGNOSIS — Z96649 Presence of unspecified artificial hip joint: Secondary | ICD-10-CM | POA: Diagnosis present

## 2014-07-07 DIAGNOSIS — Z79899 Other long term (current) drug therapy: Secondary | ICD-10-CM | POA: Diagnosis not present

## 2014-07-07 DIAGNOSIS — Z885 Allergy status to narcotic agent status: Secondary | ICD-10-CM

## 2014-07-07 DIAGNOSIS — B961 Klebsiella pneumoniae [K. pneumoniae] as the cause of diseases classified elsewhere: Secondary | ICD-10-CM | POA: Diagnosis present

## 2014-07-07 DIAGNOSIS — R519 Headache, unspecified: Secondary | ICD-10-CM

## 2014-07-07 DIAGNOSIS — K5730 Diverticulosis of large intestine without perforation or abscess without bleeding: Secondary | ICD-10-CM | POA: Diagnosis not present

## 2014-07-07 DIAGNOSIS — K838 Other specified diseases of biliary tract: Secondary | ICD-10-CM | POA: Diagnosis not present

## 2014-07-07 DIAGNOSIS — F411 Generalized anxiety disorder: Secondary | ICD-10-CM | POA: Diagnosis present

## 2014-07-07 LAB — I-STAT CHEM 8, ED
BUN: 11 mg/dL (ref 6–23)
CALCIUM ION: 1.22 mmol/L (ref 1.13–1.30)
CHLORIDE: 104 mmol/L (ref 96–112)
CREATININE: 0.6 mg/dL (ref 0.50–1.10)
GLUCOSE: 125 mg/dL — AB (ref 70–99)
HEMATOCRIT: 47 % — AB (ref 36.0–46.0)
HEMOGLOBIN: 16 g/dL — AB (ref 12.0–15.0)
POTASSIUM: 4.9 mmol/L (ref 3.5–5.1)
SODIUM: 141 mmol/L (ref 135–145)
TCO2: 25 mmol/L (ref 0–100)

## 2014-07-07 LAB — C-REACTIVE PROTEIN: CRP: <0.5 mg/dL — ABNORMAL LOW

## 2014-07-07 LAB — URINALYSIS, ROUTINE W REFLEX MICROSCOPIC
Bilirubin Urine: NEGATIVE
Glucose, UA: NEGATIVE mg/dL
Ketones, ur: NEGATIVE mg/dL
NITRITE: POSITIVE — AB
Protein, ur: NEGATIVE mg/dL
SPECIFIC GRAVITY, URINE: 1.015 (ref 1.005–1.030)
UROBILINOGEN UA: 0.2 mg/dL (ref 0.0–1.0)
pH: 7 (ref 5.0–8.0)

## 2014-07-07 LAB — COMPREHENSIVE METABOLIC PANEL
ALT: 18 U/L (ref 0–35)
ANION GAP: 9 (ref 5–15)
AST: 22 U/L (ref 0–37)
Albumin: 4.2 g/dL (ref 3.5–5.2)
Alkaline Phosphatase: 60 U/L (ref 39–117)
BUN: 10 mg/dL (ref 6–23)
CALCIUM: 9.6 mg/dL (ref 8.4–10.5)
CO2: 24 mmol/L (ref 19–32)
CREATININE: 0.59 mg/dL (ref 0.50–1.10)
Chloride: 105 mmol/L (ref 96–112)
GFR calc non Af Amer: 79 mL/min — ABNORMAL LOW (ref >90)
Glucose, Bld: 123 mg/dL — ABNORMAL HIGH (ref 70–99)
POTASSIUM: 4 mmol/L (ref 3.5–5.1)
Sodium: 138 mmol/L (ref 135–145)
TOTAL PROTEIN: 7.3 g/dL (ref 6.0–8.3)
Total Bilirubin: 0.5 mg/dL (ref 0.3–1.2)

## 2014-07-07 LAB — CBC WITH DIFFERENTIAL/PLATELET
Basophils Absolute: 0 10*3/uL (ref 0.0–0.1)
Basophils Relative: 0 % (ref 0–1)
EOS ABS: 0.1 10*3/uL (ref 0.0–0.7)
Eosinophils Relative: 1 % (ref 0–5)
HCT: 43.4 % (ref 36.0–46.0)
Hemoglobin: 14.2 g/dL (ref 12.0–15.0)
LYMPHS ABS: 1.3 10*3/uL (ref 0.7–4.0)
Lymphocytes Relative: 16 % (ref 12–46)
MCH: 28.7 pg (ref 26.0–34.0)
MCHC: 32.7 g/dL (ref 30.0–36.0)
MCV: 87.7 fL (ref 78.0–100.0)
MONOS PCT: 8 % (ref 3–12)
Monocytes Absolute: 0.7 10*3/uL (ref 0.1–1.0)
NEUTROS ABS: 6.2 10*3/uL (ref 1.7–7.7)
NEUTROS PCT: 75 % (ref 43–77)
PLATELETS: 216 10*3/uL (ref 150–400)
RBC: 4.95 MIL/uL (ref 3.87–5.11)
RDW: 13.9 % (ref 11.5–15.5)
WBC: 8.3 10*3/uL (ref 4.0–10.5)

## 2014-07-07 LAB — SEDIMENTATION RATE: SED RATE: 11 mm/h (ref 0–22)

## 2014-07-07 LAB — URINE MICROSCOPIC-ADD ON

## 2014-07-07 LAB — LIPASE, BLOOD: LIPASE: 30 U/L (ref 11–59)

## 2014-07-07 LAB — I-STAT CG4 LACTIC ACID, ED: Lactic Acid, Venous: 1 mmol/L (ref 0.5–2.0)

## 2014-07-07 MED ORDER — ACETAMINOPHEN 650 MG RE SUPP: 650.0000 mg | Freq: Four times a day (QID) | RECTAL | Status: DC | PRN

## 2014-07-07 MED ORDER — ADULT MULTIVITAMIN W/MINERALS CH
1.0000 | ORAL_TABLET | Freq: Every day | ORAL | Status: DC
  Administered 2014-07-07 – 2014-07-10 (×3): 1 via ORAL
  Filled 2014-07-07 (×4): qty 1

## 2014-07-07 MED ORDER — IOHEXOL 300 MG/ML  SOLN
50.0000 mL | Freq: Once | INTRAMUSCULAR | Status: AC | PRN
  Administered 2014-07-07: 50 mL via ORAL

## 2014-07-07 MED ORDER — SODIUM CHLORIDE 0.9 % IV SOLN
INTRAVENOUS | Status: AC
  Administered 2014-07-07: 11:00:00 via INTRAVENOUS

## 2014-07-07 MED ORDER — ACETAMINOPHEN 325 MG PO TABS
650.0000 mg | ORAL_TABLET | Freq: Four times a day (QID) | ORAL | Status: DC | PRN
  Administered 2014-07-08 – 2014-07-10 (×3): 650 mg via ORAL
  Filled 2014-07-07 (×3): qty 2

## 2014-07-07 MED ORDER — IRBESARTAN 150 MG PO TABS
150.0000 mg | ORAL_TABLET | Freq: Every day | ORAL | Status: DC
  Administered 2014-07-07 – 2014-07-10 (×3): 150 mg via ORAL
  Filled 2014-07-07 (×4): qty 1

## 2014-07-07 MED ORDER — ONDANSETRON HCL 4 MG/2ML IJ SOLN
4.0000 mg | Freq: Once | INTRAMUSCULAR | Status: AC
  Administered 2014-07-07: 4 mg via INTRAVENOUS
  Filled 2014-07-07: qty 2

## 2014-07-07 MED ORDER — CALCIUM CARBONATE 600 MG PO TABS: 600.0000 mg | ORAL_TABLET | Freq: Two times a day (BID) | ORAL | Status: DC

## 2014-07-07 MED ORDER — ENOXAPARIN SODIUM 40 MG/0.4ML ~~LOC~~ SOLN
40.0000 mg | SUBCUTANEOUS | Status: DC
  Administered 2014-07-07 – 2014-07-08 (×2): 40 mg via SUBCUTANEOUS
  Filled 2014-07-07 (×3): qty 0.4

## 2014-07-07 MED ORDER — HYDROMORPHONE HCL 1 MG/ML IJ SOLN
1.0000 mg | INTRAMUSCULAR | Status: DC | PRN
  Administered 2014-07-09: 1 mg via INTRAVENOUS
  Filled 2014-07-07: qty 1

## 2014-07-07 MED ORDER — IOHEXOL 300 MG/ML  SOLN
80.0000 mL | Freq: Once | INTRAMUSCULAR | Status: AC | PRN
  Administered 2014-07-07: 80 mL via INTRAVENOUS

## 2014-07-07 MED ORDER — FENTANYL CITRATE 0.05 MG/ML IJ SOLN
50.0000 ug | Freq: Once | INTRAMUSCULAR | Status: AC
  Administered 2014-07-07: 50 ug via INTRAVENOUS
  Filled 2014-07-07: qty 2

## 2014-07-07 MED ORDER — CEFTRIAXONE SODIUM IN DEXTROSE 20 MG/ML IV SOLN
1.0000 g | INTRAVENOUS | Status: DC
  Administered 2014-07-08 – 2014-07-10 (×3): 1 g via INTRAVENOUS
  Filled 2014-07-07 (×3): qty 50

## 2014-07-07 MED ORDER — EZETIMIBE 10 MG PO TABS
10.0000 mg | ORAL_TABLET | Freq: Every day | ORAL | Status: DC
  Administered 2014-07-07 – 2014-07-10 (×3): 10 mg via ORAL
  Filled 2014-07-07 (×4): qty 1

## 2014-07-07 MED ORDER — METOCLOPRAMIDE HCL 5 MG/ML IJ SOLN
5.0000 mg | Freq: Four times a day (QID) | INTRAMUSCULAR | Status: DC | PRN
  Administered 2014-07-07 – 2014-07-08 (×2): 5 mg via INTRAVENOUS
  Filled 2014-07-07 (×2): qty 2

## 2014-07-07 MED ORDER — ONDANSETRON HCL 4 MG/2ML IJ SOLN: 4.0000 mg | Freq: Three times a day (TID) | INTRAMUSCULAR | Status: DC | PRN

## 2014-07-07 MED ORDER — MAGNESIUM 30 MG PO TABS: 30.0000 mg | ORAL_TABLET | Freq: Two times a day (BID) | ORAL | Status: DC

## 2014-07-07 MED ORDER — ONDANSETRON HCL 4 MG/2ML IJ SOLN: 4.0000 mg | Freq: Four times a day (QID) | INTRAMUSCULAR | Status: DC | PRN

## 2014-07-07 MED ORDER — ONE-DAILY MULTI VITAMINS PO TABS: 1.0000 | ORAL_TABLET | Freq: Every day | ORAL | Status: DC

## 2014-07-07 MED ORDER — CALCIUM CARBONATE 1250 (500 CA) MG PO TABS
1.0000 | ORAL_TABLET | Freq: Two times a day (BID) | ORAL | Status: DC
  Administered 2014-07-08 – 2014-07-10 (×4): 500 mg via ORAL
  Filled 2014-07-07 (×8): qty 1

## 2014-07-07 MED ORDER — HYDROCODONE-ACETAMINOPHEN 5-325 MG PO TABS
1.0000 | ORAL_TABLET | ORAL | Status: DC | PRN
  Administered 2014-07-07 (×2): 1 via ORAL
  Filled 2014-07-07 (×2): qty 1

## 2014-07-07 MED ORDER — PANTOPRAZOLE SODIUM 40 MG PO TBEC
40.0000 mg | DELAYED_RELEASE_TABLET | Freq: Every day | ORAL | Status: DC
  Filled 2014-07-07: qty 1

## 2014-07-07 MED ORDER — ASPIRIN 81 MG PO TBEC: 81.0000 mg | DELAYED_RELEASE_TABLET | Freq: Every day | ORAL | Status: DC

## 2014-07-07 MED ORDER — ASPIRIN EC 81 MG PO TBEC
81.0000 mg | DELAYED_RELEASE_TABLET | Freq: Every day | ORAL | Status: DC
  Administered 2014-07-07 – 2014-07-10 (×3): 81 mg via ORAL
  Filled 2014-07-07 (×4): qty 1

## 2014-07-07 MED ORDER — ONDANSETRON HCL 4 MG/2ML IJ SOLN
4.0000 mg | Freq: Four times a day (QID) | INTRAMUSCULAR | Status: DC | PRN
  Administered 2014-07-07 – 2014-07-08 (×4): 4 mg via INTRAVENOUS
  Filled 2014-07-07 (×4): qty 2

## 2014-07-07 MED ORDER — METOCLOPRAMIDE HCL 5 MG/ML IJ SOLN
5.0000 mg | Freq: Once | INTRAMUSCULAR | Status: AC
  Administered 2014-07-07: 5 mg via INTRAVENOUS
  Filled 2014-07-07: qty 2

## 2014-07-07 MED ORDER — ONDANSETRON HCL 4 MG PO TABS: 4.0000 mg | ORAL_TABLET | Freq: Four times a day (QID) | ORAL | Status: DC | PRN

## 2014-07-07 MED ORDER — LORAZEPAM 2 MG/ML IJ SOLN
0.5000 mg | Freq: Once | INTRAMUSCULAR | Status: AC
  Administered 2014-07-07: 0.5 mg via INTRAVENOUS
  Filled 2014-07-07: qty 1

## 2014-07-07 MED ORDER — ALUM & MAG HYDROXIDE-SIMETH 200-200-20 MG/5ML PO SUSP: 30.0000 mL | Freq: Four times a day (QID) | ORAL | Status: DC | PRN

## 2014-07-07 MED ORDER — DEXTROSE 5 % IV SOLN
1.0000 g | Freq: Once | INTRAVENOUS | Status: AC
  Administered 2014-07-07: 1 g via INTRAVENOUS
  Filled 2014-07-07: qty 10

## 2014-07-07 MED ORDER — SODIUM CHLORIDE 0.9 % IV SOLN
INTRAVENOUS | Status: DC
  Administered 2014-07-07: 20:00:00 via INTRAVENOUS

## 2014-07-07 NOTE — ED Notes (Signed)
Hospitalist at bedside 

## 2014-07-07 NOTE — ED Provider Notes (Signed)
CSN: [id number]     Arrival date & time 07/07/14  0453 History   First MD Initiated Contact with Patient 07/07/14 [phone]     Chief Complaint  Patient presents with  . Migraine  . Abdominal Pain  . Emesis     (Consider location/radiation/quality/duration/timing/severity/associated sxs/prior Treatment) HPI  This is an 81 year old female with a past medical history of hyperlipidemia, irritable bowel syndrome, GERD, who presents with chief complaint of headache, flank pain and intractable nausea and vomiting. Patient states that yesterday around 4 PM she developed a headache in the hat band distribution that progressively worsened. The patient states that she later developed severe nausea and vomiting around 4:00 in the morning with associated rectal urgency and left flank pain. She denies any urinary symptoms, diarrhea. She's had multiple episodes of vomiting, nonbloody, nonbilious vomitus. And rates her headache at 7 out of 10. She denies any jaw claudication, visual disturbances, unilateral weakness, neck stiffness, photophobia, phonophobia. She has a history of previous tension headaches. Patient points to the bilateral temples when asked where her pain is located, but states it radiates around into the occiput. The patient also complains of epigastric pain and is diffuse abdominal pain. She is worried she has diverticulitis. She denies fevers, myalgias, chills. She has been unable to hold down any foods or fluids with multiple episodes of vomiting during history of present illness. The patient is otherwise very healthy and only takes medications for hyperlipidemia and GERD. She lives on her own and is of sound mind.  Past Medical History  Diagnosis Date  . Hyperlipidemia   . Hypertension   . History of depression   . History of gallstones   . Hip fracture 2009    surgery  . History of vertigo   . Arthritis   . RBBB (right bundle branch block)   . History of IBS    Past Surgical History   Procedure Laterality Date  . Total hip arthroplasty  02/20/2008    fractured hip  . Cholecystectomy  2008    Dr. Harlow Asa  . Other surgical history  1973    hysterectomy  . Tonsilectomy, adenoidectomy, bilateral myringotomy and tubes      age 89 tonsils and adnoids only   Family History  Problem Relation Age of Onset  . Tuberculosis Mother     very young age  . Other Father     suicide  . Cancer Sister     colon   History  Substance Use Topics  . Smoking status: Never Smoker   . Smokeless tobacco: Not on file  . Alcohol Use: No   OB History    No data available     Review of Systems  Ten systems reviewed and are negative for acute change, except as noted in the HPI.    Allergies  Codeine; Crestor; Escitalopram oxalate; and Ezetimibe-simvastatin  Home Medications   Prior to Admission medications   Medication Sig Start Date End Date Taking? Authorizing Provider  Acetaminophen (TYLENOL PO) Take 1,000 mg by mouth every 4 (four) hours as needed (pain). As needed   Yes Historical Provider, MD  aspirin (ASPIRIN EC) 81 MG EC tablet Take 81 mg by mouth daily.     Yes Historical Provider, MD  Calcium Carbonate (CALCIUM 600 PO) Take by mouth 2 (two) times daily.     Yes Historical Provider, MD  dexlansoprazole (DEXILANT) 60 MG capsule Take 60 mg by mouth daily.     Yes Historical Provider, MD  ezetimibe (ZETIA) 10 MG tablet Take 10 mg by mouth daily.     Yes Historical Provider, MD  magnesium 30 MG tablet Take 30 mg by mouth 2 (two) times daily.   Yes Historical Provider, MD  magnesium hydroxide (MILK OF MAGNESIA) 400 MG/5ML suspension Take 30 mLs by mouth daily as needed for mild constipation.   Yes Historical Provider, MD  Multiple Vitamin (MULTIVITAMIN) tablet Take 1 tablet by mouth daily.     Yes Historical Provider, MD  olmesartan (BENICAR) 20 MG tablet Take 10 mg by mouth daily.    Yes Historical Provider, MD  Wheat Dextrin (BENEFIBER PO) Take 1 tablet by mouth daily as  needed (constipation).   Yes Historical Provider, MD  zinc gluconate 50 MG tablet Take 50 mg by mouth daily.   Yes Historical Provider, MD  esomeprazole (NEXIUM) 40 MG capsule Take 1 capsule by mouth daily. 06/29/14   Historical Provider, MD  losartan (COZAAR) 50 MG tablet Take 1 tablet by mouth daily. 06/29/14   Historical Provider, MD   BP 156/68 mmHg  Pulse 95  Temp(Src) 97.7 F (36.5 C) (Oral)  Resp 20  SpO2 99% Physical Exam  Constitutional: She is oriented to person, place, and time. She appears well-developed and well-nourished. No distress.  HENT:  Head: Normocephalic and atraumatic.  Mouth/Throat: Oropharynx is clear and moist.  No ttp of the scalp/ temples/ neck. No temporal bruits.   Eyes: Conjunctivae and EOM are normal. Pupils are equal, round, and reactive to light. No scleral icterus.  No horizontal, vertical or rotational nystagmus  Neck: Normal range of motion. Neck supple.  Full active and passive ROM without pain No midline or paraspinal tenderness No nuchal rigidity or meningeal signs  Cardiovascular: Normal rate, regular rhythm and intact distal pulses.   Pulmonary/Chest: Effort normal and breath sounds normal. No respiratory distress. She has no wheezes. She has no rales.  Abdominal: Soft. Bowel sounds are normal. She exhibits no distension and no mass. There is no tenderness ( No ttp of abdomen, No CVA tenderness). There is no rebound and no guarding.  Musculoskeletal: Normal range of motion.  Lymphadenopathy:    She has no cervical adenopathy.  Neurological: She is alert and oriented to person, place, and time. She has normal reflexes. No cranial nerve deficit. She exhibits normal muscle tone. Coordination normal.  Mental Status:  Alert, oriented, thought content appropriate. Speech fluent without evidence of aphasia. Able to follow 2 step commands without difficulty.  Cranial Nerves:  II:  Peripheral visual fields grossly normal, pupils equal, round, reactive  to light III,IV, VI: ptosis not present, extra-ocular motions intact bilaterally  V,VII: smile symmetric, facial light touch sensation equal VIII: hearing grossly normal bilaterally  IX,X: gag reflex present  XI: bilateral shoulder shrug equal and strong XII: midline tongue extension  Motor:  5/5 in upper and lower extremities bilaterally including strong and equal grip strength and dorsiflexion/plantar flexion Sensory: grossly normal in all extremities.  Deep Tendon Reflexes: 2+ and symmetric  Cerebellar: normal finger-to-nose with bilateral upper extremities Gait: deferred do to active vomiting CV: distal pulses palpable throughout   Skin: Skin is warm and dry. No rash noted. She is not diaphoretic.  Psychiatric: She has a normal mood and affect. Her behavior is normal. Judgment and thought content normal.  Nursing note and vitals reviewed.   ED Course  Procedures (including critical care time) Labs Review Labs Reviewed  COMPREHENSIVE METABOLIC PANEL - Abnormal; Notable for the following:  Glucose, Bld 123 (*)    GFR calc non Af Amer 79 (*)    All other components within normal limits  URINALYSIS, ROUTINE W REFLEX MICROSCOPIC - Abnormal; Notable for the following:    APPearance CLOUDY (*)    Hgb urine dipstick TRACE (*)    Nitrite POSITIVE (*)    Leukocytes, UA MODERATE (*)    All other components within normal limits  URINE MICROSCOPIC-ADD ON - Abnormal; Notable for the following:    Squamous Epithelial / LPF FEW (*)    Bacteria, UA MANY (*)    All other components within normal limits  I-STAT CHEM 8, ED - Abnormal; Notable for the following:    Glucose, Bld 125 (*)    Hemoglobin 16.0 (*)    HCT 47.0 (*)    All other components within normal limits  URINE CULTURE  CBC WITH DIFFERENTIAL/PLATELET  LIPASE, BLOOD  I-STAT CHEM 8, ED  I-STAT CG4 LACTIC ACID, ED    Imaging Review Ct Head Wo Contrast  07/07/2014   CLINICAL DATA:  Headache for 12 hr  EXAM: CT HEAD  WITHOUT CONTRAST  TECHNIQUE: Contiguous axial images were obtained from the base of the skull through the vertex without intravenous contrast.  COMPARISON:  Head CT 10/02/2009  FINDINGS: No acute intracranial hemorrhage. No focal mass lesion. No CT evidence of acute infarction. No midline shift or mass effect. No hydrocephalus. Basilar cisterns are patent.  There is mild periventricular white matter hypodensities. Minimal cortical atrophy for age. Paranasal sinuses and mastoid air cells are clear. There is opacification of the sphenoid sinus with some sclerotic sinus wall thickening.  IMPRESSION: 1. No acute intracranial findings. 2. Mild periventricular white matter microvascular disease. 3. Chronic sphenoid sinus inflammation. 4.   Electronically Signed   By: Suzy Bouchard M.D.   On: 07/07/2014 07:25     EKG Interpretation None      MDM   Final diagnoses:  Headache  UTI (lower urinary tract infection)  Intractable vomiting with nausea, vomiting of unspecified type   8:40 AM Filed Vitals:   07/07/14 0529  BP: 156/68  Pulse: 95  Temp: 97.7 F (36.5 C)  TempSrc: Oral  Resp: 20  SpO2: 99%     Patient here with headache, nausea, vomiting. UA is positive for urinary tract infection. CT of the abdomen shows diffuse diverticular disease without active diverticulitis, no masses, no kidney stones. No signs of pyelonephritis. The patient headache is very likely due to infection, mild dehydration, active vomiting. I doubt meningitis. She has a negative head CT. No focal neurologic deficits suggesting intracranial abnormality. Patient does complain of temporal pain, however, denies jaw claudication or visual disturbances. I have ordered a sedimentation rate and CRP for evaluation of possible giant cell arteritis, although this is lower on my list of suspicion. The patient nausea, vomiting. Symptoms have been poorly controlled throughout the visit. Despite Zofran, Ativan, and Reglan. Also fluid  rehydration and fentanyl. I feel the patient will need at least an observation admission for her uncontrolled nausea, vomiting. Patient is receiving IV Rocephin 1 g currently. No PCP is listed.  Patient with intractable vomiting She will be admitted .negative abdominal ct. Urine sent for culture.   Margarita Mail, PA-C 07/15/14 1636  April Palumbo, MD 07/18/14 [phone]

## 2014-07-07 NOTE — ED Notes (Signed)
Pt is asking for ice chips.  Informed that we need to wait on CT results.

## 2014-07-07 NOTE — ED Notes (Signed)
Per Hospitalist, Pt does not need 2nd set of cultures.

## 2014-07-07 NOTE — ED Notes (Signed)
PA at bedside.

## 2014-07-07 NOTE — ED Notes (Signed)
BLOOD DRAW X2 UNSUCCESSFUL

## 2014-07-07 NOTE — H&P (Signed)
History and Physical       Hospital Admission Note Date: 07/07/2014  Patient name: [name] [name] Medical record number: [id number] Date of birth: June 12, 1922 Age: 81 y.o. Gender: female  PCP: [name], MD    Chief Complaint:  Headache with nausea and vomiting since yesterday  HPI: Patient is a 81 year old female with hyperlipidemia, hypertension, irritable bowel syndrome, GERD, chronic tension headaches who presented with intractable headache, nausea, vomiting, epigastric and left-sided abdominal pain since yesterday. History was obtained from the patient and the daughter present in the room. Patient reported that around 5 PM yesterday she developed headache, frontal and radiating to the back of her head. Patient's daughter called the PCP who recommended to take 2 additional Tylenol and see in office in the morning. However this morning patient also complained of having nausea and dry heaves and pain in the epigastric and left side of her abdomen. She denied any diarrhea. Headache was about 7 out of 10, currently improved to 4 out of 10, no visual disturbance, focal weakness, neck stiffness or photophobia or phonophobia. No fevers. Patient reports that she has been unable to hold any food down due to nausea. Patient lives at home alone and her daughter lives on the daughter on the opposite side of the street. Otherwise functional. At the time of my examination, patient reports that abdominal pain has resolved. UA positive for UTI, CT head negative for any intracranial abnormalities. CT abdomen and pelvis showed mild intrahepatic biliary ductal dilatation with a CBD dilatation likely a combination of age and prior cholecystectomy, extensive diverticular disease but no acute diverticulitis.  Review of Systems:  Constitutional: Denies fever, chills, diaphoresis, poor appetite and fatigue.  HEENT: Denies photophobia, eye pain, redness, hearing  loss, ear pain, congestion, sore throat, rhinorrhea, sneezing, mouth sores, trouble swallowing, neck pain, neck stiffness and tinnitus.   Respiratory: Denies SOB, DOE, cough, chest tightness,  and wheezing.   Cardiovascular: Denies chest pain, palpitations and leg swelling.  Gastrointestinal: See history of present illness  Genitourinary: Denies dysuria, urgency, frequency, hematuria, flank pain and difficulty urinating.  Musculoskeletal: Denies myalgias, back pain, joint swelling, arthralgias and gait problem.  Skin: Denies pallor, rash and wound.  Neurological: Denies dizziness, seizures, syncope, weakness, light-headedness, numbness, + headaches.  Hematological: Denies adenopathy. Easy bruising, personal or family bleeding history  Psychiatric/Behavioral: Denies suicidal ideation, mood changes, confusion, nervousness, sleep disturbance and agitation  Past Medical History: Past Medical History  Diagnosis Date  . Hyperlipidemia   . Hypertension   . History of depression   . History of gallstones   . Hip fracture 2009    surgery  . History of vertigo   . Arthritis   . RBBB (right bundle branch block)   . History of IBS    Past Surgical History  Procedure Laterality Date  . Total hip arthroplasty  02/20/2008    fractured hip  . Cholecystectomy  2008    Dr. Harlow Asa  . Other surgical history  1973    hysterectomy  . Tonsilectomy, adenoidectomy, bilateral myringotomy and tubes      age 61 tonsils and adnoids only    Medications: Prior to Admission medications   Medication Sig Start Date End Date Taking? Authorizing Provider  Acetaminophen (TYLENOL PO) Take 1,000 mg by mouth every 4 (four) hours as needed (pain). As needed   Yes Historical Provider, MD  aspirin (ASPIRIN EC) 81 MG EC tablet Take 81 mg by mouth daily.     Yes Historical Provider,  MD  Calcium Carbonate (CALCIUM 600 PO) Take by mouth 2 (two) times daily.     Yes Historical Provider, MD  dexlansoprazole (DEXILANT) 60  MG capsule Take 60 mg by mouth daily.     Yes Historical Provider, MD  ezetimibe (ZETIA) 10 MG tablet Take 10 mg by mouth daily.     Yes Historical Provider, MD  magnesium 30 MG tablet Take 30 mg by mouth 2 (two) times daily.   Yes Historical Provider, MD  magnesium hydroxide (MILK OF MAGNESIA) 400 MG/5ML suspension Take 30 mLs by mouth daily as needed for mild constipation.   Yes Historical Provider, MD  Multiple Vitamin (MULTIVITAMIN) tablet Take 1 tablet by mouth daily.     Yes Historical Provider, MD  olmesartan (BENICAR) 20 MG tablet Take 10 mg by mouth daily.    Yes Historical Provider, MD  Wheat Dextrin (BENEFIBER PO) Take 1 tablet by mouth daily as needed (constipation).   Yes Historical Provider, MD  zinc gluconate 50 MG tablet Take 50 mg by mouth daily.   Yes Historical Provider, MD  esomeprazole (NEXIUM) 40 MG capsule Take 1 capsule by mouth daily. 06/29/14   Historical Provider, MD  losartan (COZAAR) 50 MG tablet Take 1 tablet by mouth daily. 06/29/14   Historical Provider, MD    Allergies:   Allergies  Allergen Reactions  . Codeine Nausea And Vomiting  . Crestor [Rosuvastatin Calcium]     myalgias  . Escitalopram Oxalate     Abdominal problem  . Ezetimibe-Simvastatin     myalgias    Social History:  reports that she has never smoked. She does not have any smokeless tobacco history on file. She reports that she does not drink alcohol or use illicit drugs.  Family History: Family History  Problem Relation Age of Onset  . Tuberculosis Mother     very young age  . Other Father     suicide  . Cancer Sister     colon    Physical Exam: Blood pressure 149/78, pulse 83, temperature 97.4 F (36.3 C), temperature source Oral, resp. rate 18, SpO2 100 %. General: Alert, awake, oriented x3, in no acute distress. HEENT: normocephalic, atraumatic, anicteric sclera, pink conjunctiva, pupils equal and reactive to light and accomodation, oropharynx clear Neck: supple, no masses or  lymphadenopathy, no goiter, no bruits  Heart: Regular rate and rhythm, without murmurs, rubs or gallops. Lungs: Clear to auscultation bilaterally, no wheezing, rales or rhonchi. Abdomen: Soft, nontender, nondistended, positive bowel sounds, no masses. Extremities: No clubbing, cyanosis or edema with positive pedal pulses. Neuro: Grossly intact, no focal neurological deficits, strength 5/5 upper and lower extremities bilaterally Psych: alert and oriented x 3, normal mood and affect Skin: no rashes or lesions, warm and dry   LABS on Admission:  Basic Metabolic Panel:  Recent Labs Lab 07/07/14 0608 07/07/14 0640  NA 138 141  K 4.0 4.9  CL 105 104  CO2 24  --   GLUCOSE 123* 125*  BUN 10 11  CREATININE 0.59 0.60  CALCIUM 9.6  --    Liver Function Tests:  Recent Labs Lab 07/07/14 0608  AST 22  ALT 18  ALKPHOS 60  BILITOT 0.5  PROT 7.3  ALBUMIN 4.2    Recent Labs Lab 07/07/14 0608  LIPASE 30   No results for input(s): AMMONIA in the last 168 hours. CBC:  Recent Labs Lab 07/07/14 0608 07/07/14 0640  WBC 8.3  --   NEUTROABS 6.2  --   HGB  14.2 16.0*  HCT 43.4 47.0*  MCV 87.7  --   PLT 216  --    Cardiac Enzymes: No results for input(s): CKTOTAL, CKMB, CKMBINDEX, TROPONINI in the last 168 hours. BNP: Invalid input(s): POCBNP CBG: No results for input(s): GLUCAP in the last 168 hours.   Radiological Exams on Admission: Ct Head Wo Contrast  07/07/2014   CLINICAL DATA:  Headache for 12 hr  EXAM: CT HEAD WITHOUT CONTRAST  TECHNIQUE: Contiguous axial images were obtained from the base of the skull through the vertex without intravenous contrast.  COMPARISON:  Head CT 10/02/2009  FINDINGS: No acute intracranial hemorrhage. No focal mass lesion. No CT evidence of acute infarction. No midline shift or mass effect. No hydrocephalus. Basilar cisterns are patent.  There is mild periventricular white matter hypodensities. Minimal cortical atrophy for age. Paranasal sinuses  and mastoid air cells are clear. There is opacification of the sphenoid sinus with some sclerotic sinus wall thickening.  IMPRESSION: 1. No acute intracranial findings. 2. Mild periventricular white matter microvascular disease. 3. Chronic sphenoid sinus inflammation. 4.   Electronically Signed   By: Suzy Bouchard M.D.   On: 07/07/2014 07:25   Ct Abdomen Pelvis W Contrast  07/07/2014   CLINICAL DATA:  Migraine, abdominal pain, emesis.  EXAM: CT ABDOMEN AND PELVIS WITH CONTRAST  TECHNIQUE: Multidetector CT imaging of the abdomen and pelvis was performed using the standard protocol following bolus administration of intravenous contrast.  CONTRAST:  42m OMNIPAQUE IOHEXOL 300 MG/ML  SOLN  COMPARISON:  CT 12/26/2011  FINDINGS: Lower chest: Lung bases are clear.  Hepatobiliary: No focal hepatic lesion. Patient status post cholecystectomy. There is mild intrahepatic and extrahepatic biliary duct dilatation which is slightly increased from comparison exam. The mid common bile duct measures 8 mm compared 8 mm on prior.  Pancreas: Pancreas is normal. No ductal dilatation. No pancreatic inflammation.  Spleen: Normal spleen  Adrenals/urinary tract: Adrenal glands are normal. Peripelvic cysts on the left. No hydronephrosis. No ureterolithiasis or obstructive uropathy. Normal renal cortical parenchyma. Tiny probable cysts within the right kidney.  Bladder is normal. There is laxity of the pelvic floor with the base of the bladder extending anterior to the vagina. The most inferior aspect of the bladder is not imaged.  Stomach/Bowel: Large hiatal hernia. Stomach, small bowel, cecum normal. There are multiple diverticula throughout the colon without acute inflammation. Heavy diverticular disease of the sigmoid colon.  Vascular/Lymphatic: Abdominal aorta is normal caliber with atherosclerotic calcification. There is no retroperitoneal or periportal lymphadenopathy. No pelvic lymphadenopathy.  Reproductive: Benign-appearing  cyst associated with the right ovary measuring 22 mm this compares to 20 mm on CT of 12/26/2011  Musculoskeletal: There is a compression fracture the superior endplate of L3 which is new from 12/26/2011 but does not appear acute. Approximately 40% loss vertebral body. No significant retropulsion.  Other: No free fluid.  IMPRESSION: 1. Mild intrahepatic biliary duct dilatation with common bile duct dilatation is likely a combination of age and prior cholecystectomy. 2. Extensive diverticular disease most dense in the sigmoid colon without evidence acute diverticulitis. 3. Cystocele of the urinary bladder. 4. Compression fracture at L3 is new from 2013 but is likely chronic. 5. Moderate hiatal hernia.   Electronically Signed   By: SSuzy BouchardM.D.   On: 07/07/2014 07:51    Assessment/Plan Principal Problem:   UTI (urinary tract infection) - Obtain urine culture and sensitivities, placed on IV Rocephin. Patient has no fevers, leukocytosis or urinary symptoms, did have epigastric  and left-sided abdominal pain. One set of blood cultures done in ED.  Active Problems:   Headache: The patient has history of chronic tension headaches and likely exacerbated due to nausea and vomiting and UTI. She has no visual disturbance, focal neurological deficits, or meningeal signs - ESR pending to rule out giant cell arteritis - Continue pain control, Zofran/ Reglan     Intractable vomiting with nausea with abdominal pain: Currently abdominal pain has resolved  - Unclear etiology, CT abdomen and pelvis negative for any acute diverticulitis, possibly gastritis. - Continue PPI, IV fluids, pain control    Dehydration - Continue IV fluid hydration   DVT prophylaxis:  Lovenox  CODE STATUS:  discussed in detail with the patient, she wants DO NOT RESUSCITATE status  Family Communication: Admission, patients condition and plan of care including tests being ordered have been discussed with the patient and daughter  who indicates understanding and agree with the plan and Code Status   Further plan will depend as patient's clinical course evolves and further radiologic and laboratory data become available.   Time Spent on Admission: 1 hour  Cecile Guevara M.D. [facility] 07/07/2014, 9:32 AM Pager: [phone]  If 7PM-7AM, please contact night-coverage [email or web address] Password [email or web address]

## 2014-07-07 NOTE — Progress Notes (Signed)
PT Cancellation Note  Patient Details Name: [name] [name] MRN: [id number] DOB: [birth year]   Cancelled Treatment:    Reason Eval/Treat Not Completed: Other (comment)  Pt still not feeling very well, rag over her head and still p[retty N/V. Politely refused but does want to see [place] tomorrow. Will check back in a.m   Yoshiharu Brassell, [facility] 07/07/2014, 4:05 PM  Clide Dales, PT Pager: [phone] 07/07/2014

## 2014-07-07 NOTE — ED Notes (Addendum)
Pt states she has a migraine headache that started at 79  Daughter came over about midnight and called her PCP and he told her to take 2 additional tylenol  This morning pt states she is having pain in her left side and has dry heaves  Pt states she has had a small amt of diarrhea

## 2014-07-07 NOTE — ED Notes (Signed)
Patient transported to CT 

## 2014-07-07 NOTE — ED Notes (Signed)
Pt rates her abdominal pain about a 7 as well

## 2014-07-07 NOTE — ED Notes (Signed)
Main lab asked to come draw 2nd set of blood cultures.

## 2014-07-08 ENCOUNTER — Inpatient Hospital Stay (HOSPITAL_COMMUNITY): Payer: Medicare Other

## 2014-07-08 LAB — CBC
HEMATOCRIT: 37.7 % (ref 36.0–46.0)
Hemoglobin: 12.2 g/dL (ref 12.0–15.0)
MCH: 28.5 pg (ref 26.0–34.0)
MCHC: 32.4 g/dL (ref 30.0–36.0)
MCV: 88.1 fL (ref 78.0–100.0)
PLATELETS: 175 10*3/uL (ref 150–400)
RBC: 4.28 MIL/uL (ref 3.87–5.11)
RDW: 13.9 % (ref 11.5–15.5)
WBC: 8.9 10*3/uL (ref 4.0–10.5)

## 2014-07-08 LAB — BASIC METABOLIC PANEL
Anion gap: 6 (ref 5–15)
BUN: 8 mg/dL (ref 6–23)
CO2: 23 mmol/L (ref 19–32)
Calcium: 8.6 mg/dL (ref 8.4–10.5)
Chloride: 110 mmol/L (ref 96–112)
Creatinine, Ser: 0.55 mg/dL (ref 0.50–1.10)
GFR, EST NON AFRICAN AMERICAN: 81 mL/min — AB (ref >90)
GLUCOSE: 114 mg/dL — AB (ref 70–99)
POTASSIUM: 3.7 mmol/L (ref 3.5–5.1)
SODIUM: 139 mmol/L (ref 135–145)

## 2014-07-08 MED ORDER — SODIUM CHLORIDE 0.9 % IV SOLN
INTRAVENOUS | Status: DC
  Administered 2014-07-08 – 2014-07-10 (×2): via INTRAVENOUS

## 2014-07-08 MED ORDER — METOCLOPRAMIDE HCL 5 MG/ML IJ SOLN
10.0000 mg | Freq: Four times a day (QID) | INTRAMUSCULAR | Status: DC
  Administered 2014-07-08 – 2014-07-10 (×9): 10 mg via INTRAVENOUS
  Filled 2014-07-08 (×17): qty 2

## 2014-07-08 MED ORDER — PANTOPRAZOLE SODIUM 40 MG IV SOLR
40.0000 mg | Freq: Two times a day (BID) | INTRAVENOUS | Status: DC
  Administered 2014-07-08 – 2014-07-10 (×5): 40 mg via INTRAVENOUS
  Filled 2014-07-08 (×6): qty 40

## 2014-07-08 NOTE — Progress Notes (Addendum)
[facility]                                                                              Patient Demographics  [name] [name], is a 81 y.o. female, DOB - [birth year], [name]  Admit date - 07/07/2014   Admitting Physician Eduin Friedel Krystal Eaton, MD  Outpatient Primary MD for the patient is [name], MD  LOS - 1   Chief Complaint  Patient presents with  . Migraine  . Abdominal Pain  . Emesis       Brief HPI   Patient is a 81 year old female with hyperlipidemia, hypertension, irritable bowel syndrome, GERD, chronic tension headaches who presented with intractable headache, nausea, vomiting, epigastric and left-sided abdominal pain since yesterday. History was obtained from the patient and the daughter present in the room. Patient reported that around 5 PM the day prior to admission, she had developed headache, frontal and radiating to the back of her head. Patient's daughter called the PCP who recommended to take 2 additional Tylenol and see in office in the morning. However, on the morning of admission, patient also complained of having nausea and dry heaves and pain in the epigastric and left side of her abdomen. She denied any diarrhea. Headache was about 7 out of 10, improved to 4 out of 10 in ed, no visual disturbance, focal weakness, neck stiffness or photophobia or phonophobia. No fevers. Patient reports that she has been unable to hold any food down due to nausea. Patient lives at home alone and her daughter lives on the daughter on the opposite side of the street. Otherwise functional. At the time of my examination in ed, patient reports that abdominal pain has resolved. UA was positive for UTI, CT head was negative for any intracranial abnormalities. CT abdomen and pelvis showed mild intrahepatic biliary ductal dilatation with a CBD dilatation likely a combination of age and prior cholecystectomy, extensive diverticular disease but no acute  diverticulitis.   Assessment & Plan    Principal Problem:   UTI (urinary tract infection) - Continue IV Rocephin, follow urine culture and sensitivities, continue IV fluid hydration  Active Problems: Headache: The patient has history of chronic tension headaches and likely exacerbated due to nausea and vomiting and UTI. She has no visual disturbance, focal neurological deficits, or meningeal signs.  - Patient continues to have headache although has improved somewhat from yesterday - ESR , CRP normal rules out giant cell arteritis - During my examination today, patient still does not have any focal neurological deficits however continues to have persistent nausea and dry heaving. RN and I stood her up and she felt somewhat wobbly, Romberg's negative, finger-to-nose test negative. She had difficult time doing heel-to-shin test due to SCDs. Patient states that this headache is somewhat different from her chronic tension headaches., Will check MRI of the brain to rule out any cerebellar stroke - Addendum: MRI brain negative for ac CVA.    Intractable vomiting with nausea with abdominal pain: Currently abdominal pain has resolved  -  CT abdomen and pelvis negative for any acute diverticulitis, possibly gastritis. - Placed on IV PPI,  IV fluids, pain  control. Patient felt somewhat better with Reglan yesterday, placed on IV scheduled Reglan for today, continue IV when necessary Zofran - Changed to clear liquid diet, ordered a barium esophagogram however was told that it is not done over the weekend   Dehydration - Continue IV fluid hydration   Code Status: Full code   Family Communication: Discussed in detail with the patient, all imaging results, lab results explained to the patient    Disposition Plan: Not medically ready   Time Spent in minutes   25 minutes  Procedures  CT head, CT abdomen and pelvis  Consults   none  DVT Prophylaxis   Lovenox  Medications  Scheduled  Meds: . aspirin EC  81 mg Oral Daily  . calcium carbonate  1 tablet Oral BID WC  . cefTRIAXone (ROCEPHIN)  IV  1 g Intravenous Q24H  . enoxaparin (LOVENOX) injection  40 mg Subcutaneous Q24H  . ezetimibe  10 mg Oral Daily  . irbesartan  150 mg Oral Daily  . metoCLOPramide (REGLAN) injection  10 mg Intravenous 4 times per day  . multivitamin with minerals  1 tablet Oral Daily  . pantoprazole (PROTONIX) IV  40 mg Intravenous Q12H   Continuous Infusions: . sodium chloride     PRN Meds:.acetaminophen **OR** acetaminophen, alum & mag hydroxide-simeth, HYDROmorphone (DILAUDID) injection, ondansetron **OR** ondansetron (ZOFRAN) IV   Antibiotics   Anti-infectives    Start     Dose/Rate Route Frequency Ordered Stop   07/08/14 0800  cefTRIAXone (ROCEPHIN) 1 g in dextrose 5 % 50 mL IVPB - Premix     1 g 100 mL/hr over 30 Minutes Intravenous Every 24 hours 07/07/14 1029     07/07/14 0715  cefTRIAXone (ROCEPHIN) 1 g in dextrose 5 % 50 mL IVPB     1 g 100 mL/hr over 30 Minutes Intravenous  Once 07/07/14 0709 07/07/14 0908        Subjective:   [name] [name] was seen and examined today. Still feeling miserable today, having headache although improved to 4/10, no visual deficits, no focal weakness, no slurred speech. Continues to have intractable nausea with dry heaving, no abdominal pain or diarrhea. No chest pain or shortness of breath, numbness or tingling. overnight.    Objective:   Blood pressure 147/63, pulse 71, temperature 98.4 F (36.9 C), temperature source Oral, resp. rate 20, SpO2 99 %.  Wt Readings from Last 3 Encounters:  No data found for Wt    No intake or output data in the 24 hours ending 07/08/14 1025  Exam  General: Alert and oriented x 3, NAD, feels miserable and uncomfortable   HEENT:  PERRLA, EOMI, Anicteic Sclera, mucous membranes moist.   Neck: Supple, no JVD, no masses  CVS: S1 S2 auscultated, no rubs, murmurs or gallops. Regular rate and  rhythm.  Respiratory: Clear to auscultation bilaterally, no wheezing, rales or rhonchi  Abdomen: Soft, nontender, nondistended, + bowel sounds  Ext: no cyanosis clubbing or edema  Neuro: AAOx3, Cr N's II- XII. Strength 5/5 upper and lower extremities bilaterallySomewhat wobbly on ambulation, finger to nose normal, Romberg sign negative   Skin: No rashes  Psych: Normal affect and demeanor, alert and oriented x3    Data Review   Micro Results Recent Results (from the past 240 hour(s))  Culture, blood (routine x 2)     Status: None (Preliminary result)   Collection Time: 07/07/14  8:46 AM  Result Value Ref Range Status   Specimen Description BLOOD RIGHT HAND  Final   Special Requests BOTTLES DRAWN AEROBIC ONLY 3ML  Final   Culture   Final           BLOOD CULTURE RECEIVED NO GROWTH TO DATE CULTURE WILL BE HELD FOR 5 DAYS BEFORE ISSUING A FINAL NEGATIVE REPORT Performed at [facility]    Report Status PENDING  Incomplete    Radiology Reports Ct Head Wo Contrast  07/07/2014   CLINICAL DATA:  Headache for 12 hr  EXAM: CT HEAD WITHOUT CONTRAST  TECHNIQUE: Contiguous axial images were obtained from the base of the skull through the vertex without intravenous contrast.  COMPARISON:  Head CT 10/02/2009  FINDINGS: No acute intracranial hemorrhage. No focal mass lesion. No CT evidence of acute infarction. No midline shift or mass effect. No hydrocephalus. Basilar cisterns are patent.  There is mild periventricular white matter hypodensities. Minimal cortical atrophy for age. Paranasal sinuses and mastoid air cells are clear. There is opacification of the sphenoid sinus with some sclerotic sinus wall thickening.  IMPRESSION: 1. No acute intracranial findings. 2. Mild periventricular white matter microvascular disease. 3. Chronic sphenoid sinus inflammation. 4.   Electronically Signed   By: Suzy Bouchard M.D.   On: 07/07/2014 07:25   Ct Abdomen Pelvis W Contrast  07/07/2014   CLINICAL  DATA:  Migraine, abdominal pain, emesis.  EXAM: CT ABDOMEN AND PELVIS WITH CONTRAST  TECHNIQUE: Multidetector CT imaging of the abdomen and pelvis was performed using the standard protocol following bolus administration of intravenous contrast.  CONTRAST:  54m OMNIPAQUE IOHEXOL 300 MG/ML  SOLN  COMPARISON:  CT 12/26/2011  FINDINGS: Lower chest: Lung bases are clear.  Hepatobiliary: No focal hepatic lesion. Patient status post cholecystectomy. There is mild intrahepatic and extrahepatic biliary duct dilatation which is slightly increased from comparison exam. The mid common bile duct measures 8 mm compared 8 mm on prior.  Pancreas: Pancreas is normal. No ductal dilatation. No pancreatic inflammation.  Spleen: Normal spleen  Adrenals/urinary tract: Adrenal glands are normal. Peripelvic cysts on the left. No hydronephrosis. No ureterolithiasis or obstructive uropathy. Normal renal cortical parenchyma. Tiny probable cysts within the right kidney.  Bladder is normal. There is laxity of the pelvic floor with the base of the bladder extending anterior to the vagina. The most inferior aspect of the bladder is not imaged.  Stomach/Bowel: Large hiatal hernia. Stomach, small bowel, cecum normal. There are multiple diverticula throughout the colon without acute inflammation. Heavy diverticular disease of the sigmoid colon.  Vascular/Lymphatic: Abdominal aorta is normal caliber with atherosclerotic calcification. There is no retroperitoneal or periportal lymphadenopathy. No pelvic lymphadenopathy.  Reproductive: Benign-appearing cyst associated with the right ovary measuring 22 mm this compares to 20 mm on CT of 12/26/2011  Musculoskeletal: There is a compression fracture the superior endplate of L3 which is new from 12/26/2011 but does not appear acute. Approximately 40% loss vertebral body. No significant retropulsion.  Other: No free fluid.  IMPRESSION: 1. Mild intrahepatic biliary duct dilatation with common bile duct  dilatation is likely a combination of age and prior cholecystectomy. 2. Extensive diverticular disease most dense in the sigmoid colon without evidence acute diverticulitis. 3. Cystocele of the urinary bladder. 4. Compression fracture at L3 is new from 2013 but is likely chronic. 5. Moderate hiatal hernia.   Electronically Signed   By: SSuzy BouchardM.D.   On: 07/07/2014 07:51    CBC  Recent Labs Lab 07/07/14 0608 07/07/14 0640 07/08/14 0532  WBC 8.3  --  8.9  HGB  14.2 16.0* 12.2  HCT 43.4 47.0* 37.7  PLT 216  --  175  MCV 87.7  --  88.1  MCH 28.7  --  28.5  MCHC 32.7  --  32.4  RDW 13.9  --  13.9  LYMPHSABS 1.3  --   --   MONOABS 0.7  --   --   EOSABS 0.1  --   --   BASOSABS 0.0  --   --     Chemistries   Recent Labs Lab 07/07/14 0608 07/07/14 0640 07/08/14 0532  NA 138 141 139  K 4.0 4.9 3.7  CL 105 104 110  CO2 24  --  23  GLUCOSE 123* 125* 114*  BUN '10 11 8  ' CREATININE 0.59 0.60 0.55  CALCIUM 9.6  --  8.6  AST 22  --   --   ALT 18  --   --   ALKPHOS 60  --   --   BILITOT 0.5  --   --    ------------------------------------------------------------------------------------------------------------------ CrCl cannot be calculated (Unknown ideal weight.). ------------------------------------------------------------------------------------------------------------------ No results for input(s): HGBA1C in the last 72 hours. ------------------------------------------------------------------------------------------------------------------ No results for input(s): CHOL, HDL, LDLCALC, TRIG, CHOLHDL, LDLDIRECT in the last 72 hours. ------------------------------------------------------------------------------------------------------------------ No results for input(s): TSH, T4TOTAL, T3FREE, THYROIDAB in the last 72 hours.  Invalid input(s): FREET3 ------------------------------------------------------------------------------------------------------------------ No  results for input(s): VITAMINB12, FOLATE, FERRITIN, TIBC, IRON, RETICCTPCT in the last 72 hours.  Coagulation profile No results for input(s): INR, PROTIME in the last 168 hours.  No results for input(s): DDIMER in the last 72 hours.  Cardiac Enzymes No results for input(s): CKMB, TROPONINI, MYOGLOBIN in the last 168 hours.  Invalid input(s): CK ------------------------------------------------------------------------------------------------------------------ Invalid input(s): POCBNP  No results for input(s): GLUCAP in the last 72 hours.   Janae Bonser M.D. [facility] 07/08/2014, 10:25 AM  Pager: [phone]   Between 7am to 7pm - call Pager - [phone]  After 7pm go to [email or web address] - password [email or web address]  Call night coverage person covering after 7pm

## 2014-07-08 NOTE — Evaluation (Signed)
Physical Therapy Evaluation Patient Details Name: [name] [name] MRN: [id number] DOB: 10/18/1922 Today's Date: 07/08/2014   History of Present Illness  Headache with nausea and vomiting, UTI, admitted 07/07/14.  Clinical Impression  Patient is a bit unsteady,  Advised that patient should not be home alone. Patient will benefit from PT to address problems listed below.   Follow Up Recommendations Home health PT;Supervision/Assistance - 24 hour    Equipment Recommendations  None recommended by PT    Recommendations for Other Services       Precautions / Restrictions Precautions Precautions: Fall      Mobility  Bed Mobility Overal bed mobility: Modified Independent                Transfers Overall transfer level: Needs assistance Equipment used: 1 person hand held assist Transfers: Sit to/from Stand Sit to Stand: Min assist         General transfer comment: steady assist  Ambulation/Gait Ambulation/Gait assistance: Min assist Ambulation Distance (Feet): 20 Feet Assistive device: 1 person hand held assist Gait Pattern/deviations: Step-through pattern     General Gait Details: steady assist for balance  Stairs            Wheelchair Mobility    Modified Rankin (Stroke Patients Only)       Balance Overall balance assessment: Needs assistance Sitting-balance support: Feet supported;No upper extremity supported Sitting balance-Leahy Scale: Good     Standing balance support: During functional activity;No upper extremity supported Standing balance-Leahy Scale: Fair                               Pertinent Vitals/Pain Pain Assessment: No/denies pain    Home Living Family/patient expects to be discharged to:: Private residence Living Arrangements: Alone Available Help at Discharge: Family;Friend(s) Type of Home: House Home Access: Stairs to enter   [employer] of Steps: 1 Home Layout: One level Home Equipment: Cane -  single point;Walker - 2 wheels      Prior Function Level of Independence: Independent         Comments: has RW  and cane but doesn't have to use them. Occasionally cane.      Hand Dominance        Extremity/Trunk Assessment   Upper Extremity Assessment: Generalized weakness           Lower Extremity Assessment: Generalized weakness      Cervical / Trunk Assessment: Kyphotic  Communication   Communication: No difficulties  Cognition Arousal/Alertness: Awake/alert Behavior During Therapy: WFL for tasks assessed/performed Overall Cognitive Status: Within Functional Limits for tasks assessed                      General Comments      Exercises        Assessment/Plan    PT Assessment Patient needs continued PT services  PT Diagnosis Difficulty walking;Generalized weakness   PT Problem List Decreased strength;Decreased activity tolerance;Decreased mobility;Decreased safety awareness  PT Treatment Interventions DME instruction;Gait training;Functional mobility training;Therapeutic activities;Therapeutic exercise;Patient/family education;Balance training   PT Goals (Current goals can be found in the Care Plan section) Acute Rehab PT Goals Patient Stated Goal: to go home when I feel better. PT Goal Formulation: With patient/family Time For Goal Achievement: 07/22/14 Potential to Achieve Goals: Good    Frequency Min 3X/week   Barriers to discharge        Co-evaluation  End of Session   Activity Tolerance: Patient tolerated treatment well Patient left: in bed;with call bell/phone within reach;with family/visitor present Nurse Communication: Mobility status         Time: 1610-9604 PT Time Calculation (min) (ACUTE ONLY): 18 min   Charges:   PT Evaluation $Initial PT Evaluation Tier I: 1 Procedure     PT G CodesClaretha [name] 07/08/2014, 5:37 PM

## 2014-07-08 NOTE — ED Provider Notes (Signed)
Medical screening examination/treatment/procedure(s) were conducted as a shared visit with non-physician practitioner(s) and myself.  I personally evaluated the patient during the encounter.   EKG Interpretation None      Seen and personally examined.  See Abby's HPI for details.   Past Medical History  Diagnosis Date  . Hyperlipidemia   . Hypertension   . History of depression   . History of gallstones   . Hip fracture 2009    surgery  . History of vertigo   . Arthritis   . RBBB (right bundle branch block)   . History of IBS    Allergies  Allergen Reactions  . Codeine Nausea And Vomiting  . Crestor [Rosuvastatin Calcium]     myalgias  . Escitalopram Oxalate     Abdominal problem  . Ezetimibe-Simvastatin     myalgias    Current facility-administered medications:  .  0.9 %  sodium chloride infusion, , Intravenous, Continuous, Ripudeep K Rai, MD, Last Rate: 75 mL/hr at 07/07/14 2013 .  acetaminophen (TYLENOL) tablet 650 mg, 650 mg, Oral, Q6H PRN **OR** acetaminophen (TYLENOL) suppository 650 mg, 650 mg, Rectal, Q6H PRN, Ripudeep K Rai, MD .  alum & mag hydroxide-simeth (MAALOX/MYLANTA) 200-200-20 MG/5ML suspension 30 mL, 30 mL, Oral, Q6H PRN, Ripudeep K Rai, MD .  aspirin EC tablet 81 mg, 81 mg, Oral, Daily, Ripudeep K Rai, MD, 81 mg at 07/07/14 1130 .  calcium carbonate (OS-CAL - dosed in mg of elemental calcium) tablet 500 mg of elemental calcium, 1 tablet, Oral, BID WC, Ripudeep K Rai, MD, 500 mg of elemental calcium at 07/07/14 1700 .  cefTRIAXone (ROCEPHIN) 1 g in dextrose 5 % 50 mL IVPB - Premix, 1 g, Intravenous, Q24H, Ripudeep K Rai, MD .  enoxaparin (LOVENOX) injection 40 mg, 40 mg, Subcutaneous, Q24H, Ripudeep K Rai, MD, 40 mg at 07/07/14 1130 .  ezetimibe (ZETIA) tablet 10 mg, 10 mg, Oral, Daily, Ripudeep K Rai, MD, 10 mg at 07/07/14 1129 .  HYDROcodone-acetaminophen (NORCO/VICODIN) 5-325 MG per tablet 1-2 tablet, 1-2 tablet, Oral, Q4H PRN, Ripudeep K Rai, MD, 1  tablet at 07/07/14 1750 .  HYDROmorphone (DILAUDID) injection 1 mg, 1 mg, Intravenous, Q4H PRN, Ripudeep K Rai, MD .  irbesartan (AVAPRO) tablet 150 mg, 150 mg, Oral, Daily, Ripudeep K Rai, MD, 150 mg at 07/07/14 1129 .  metoCLOPramide (REGLAN) injection 5 mg, 5 mg, Intravenous, Q6H PRN, Ripudeep K Rai, MD, 5 mg at 07/07/14 1836 .  multivitamin with minerals tablet 1 tablet, 1 tablet, Oral, Daily, Ripudeep K Rai, MD, 1 tablet at 07/07/14 1130 .  ondansetron (ZOFRAN) tablet 4 mg, 4 mg, Oral, Q6H PRN **OR** ondansetron (ZOFRAN) injection 4 mg, 4 mg, Intravenous, Q6H PRN, Ripudeep K Rai, MD, 4 mg at 07/08/14 0244 .  pantoprazole (PROTONIX) EC tablet 40 mg, 40 mg, Oral, Daily, Ripudeep K Rai, MD NCAT PERRL, EOMI MMM Neck is supple RRR CTAB NABS, Soft no guarding no rebound AO3 intact cranial nerves  Severe UTI with vomiting plan addmision.    Veatrice Kells, MD 07/08/14 [phone]

## 2014-07-09 LAB — BASIC METABOLIC PANEL
Anion gap: 6 (ref 5–15)
BUN: 10 mg/dL (ref 6–23)
CALCIUM: 8.6 mg/dL (ref 8.4–10.5)
CHLORIDE: 110 mmol/L (ref 96–112)
CO2: 25 mmol/L (ref 19–32)
Creatinine, Ser: 0.59 mg/dL (ref 0.50–1.10)
GFR calc Af Amer: >90 mL/min (ref >90)
GFR, EST NON AFRICAN AMERICAN: 79 mL/min — AB (ref >90)
Glucose, Bld: 87 mg/dL (ref 70–99)
Potassium: 3.4 mmol/L — ABNORMAL LOW (ref 3.5–5.1)
Sodium: 141 mmol/L (ref 135–145)

## 2014-07-09 MED ORDER — ZOLPIDEM TARTRATE 5 MG PO TABS
5.0000 mg | ORAL_TABLET | Freq: Every day | ORAL | Status: DC
  Administered 2014-07-09: 5 mg via ORAL
  Filled 2014-07-09: qty 1

## 2014-07-09 NOTE — Progress Notes (Signed)
[facility]                                                                              Patient Demographics  [name] [name], is a 81 y.o. female, DOB - 27-May-1922, WYS:[id number]  Admit date - 07/07/2014   Admitting Physician  Bender Krystal Eaton, MD  Outpatient Primary MD for the patient is [name], MD  LOS - 2   Chief Complaint  Patient presents with  . Migraine  . Abdominal Pain  . Emesis       Brief HPI   Patient is a 81 year old female with hyperlipidemia, hypertension, irritable bowel syndrome, GERD, chronic tension headaches who presented with intractable headache, nausea, vomiting, epigastric and left-sided abdominal pain since yesterday. History was obtained from the patient and the daughter present in the room. Patient reported that around 5 PM the day prior to admission, she had developed headache, frontal and radiating to the back of her head. Patient's daughter called the PCP who recommended to take 2 additional Tylenol and see in office in the morning. However, on the morning of admission, patient also complained of having nausea and dry heaves and pain in the epigastric and left side of her abdomen. She denied any diarrhea. Headache was about 7 out of 10, improved to 4 out of 10 in ed, no visual disturbance, focal weakness, neck stiffness or photophobia or phonophobia. No fevers. Patient reports that she has been unable to hold any food down due to nausea. Patient lives at home alone and her daughter lives on the daughter on the opposite side of the street. Otherwise functional. At the time of my examination in ed, patient reports that abdominal pain has resolved. UA was positive for UTI, CT head was negative for any intracranial abnormalities. CT abdomen and pelvis showed mild intrahepatic biliary ductal dilatation with a CBD dilatation likely a combination of age and prior cholecystectomy, extensive diverticular disease but no acute  diverticulitis.   Assessment & Plan    Principal Problem:   UTI (urinary tract infection) - Continue IV Rocephin, urine culture and sensitivity still pending - continue IV fluids   Active Problems: Headache: The patient has history of chronic tension headaches and likely exacerbated due to nausea and vomiting and UTI. She has no visual disturbance, focal neurological deficits, or meningeal signs.  - Patient continues to have headache although has improved somewhat from yesterday - ESR , CRP normal rules out giant cell arteritis - MRI of the brain negative for acute CVA  Hematoma right forehead - Patient may have had a fall prior to admission, hematoma showing up now, she also reports easy bruising   Intractable vomiting with nausea with abdominal pain:  - Patient reports significant improvement in her symptoms today,  CT abdomen and pelvis negative for any acute diverticulitis, possibly gastritis. - Continue IV fluids, IV PPI, Reglan, as needed Zofran.  - Diet advance to soft solids today  - ordered barium esophagogram however was told by radiology that it is not done over the weekend   Dehydration - Continue IV fluid hydration   Code Status: Full code   Family Communication: Discussed  in detail with the patient, all imaging results, lab results explained to the patient    Disposition Plan: Not medically ready , hopefully DC tomorrow  Time Spent in minutes   25 minutes  Procedures  CT head, CT abdomen and pelvis  Consults   none  DVT Prophylaxis   discontinue Lovenox due to hematoma  Medications  Scheduled Meds: . aspirin EC  81 mg Oral Daily  . calcium carbonate  1 tablet Oral BID WC  . cefTRIAXone (ROCEPHIN)  IV  1 g Intravenous Q24H  . enoxaparin (LOVENOX) injection  40 mg Subcutaneous Q24H  . ezetimibe  10 mg Oral Daily  . irbesartan  150 mg Oral Daily  . metoCLOPramide (REGLAN) injection  10 mg Intravenous 4 times per day  . multivitamin with minerals   1 tablet Oral Daily  . pantoprazole (PROTONIX) IV  40 mg Intravenous Q12H  . zolpidem  5 mg Oral QHS   Continuous Infusions: . sodium chloride 75 mL/hr at 07/09/14 0652   PRN Meds:.acetaminophen **OR** acetaminophen, alum & mag hydroxide-simeth, HYDROmorphone (DILAUDID) injection, ondansetron **OR** ondansetron (ZOFRAN) IV   Antibiotics   Anti-infectives    Start     Dose/Rate Route Frequency Ordered Stop   07/08/14 0800  cefTRIAXone (ROCEPHIN) 1 g in dextrose 5 % 50 mL IVPB - Premix     1 g 100 mL/hr over 30 Minutes Intravenous Every 24 hours 07/07/14 1029     07/07/14 0715  cefTRIAXone (ROCEPHIN) 1 g in dextrose 5 % 50 mL IVPB     1 g 100 mL/hr over 30 Minutes Intravenous  Once 07/07/14 0709 07/07/14 0908        Subjective:   Leeza Heiner was seen and examined today. Feels somewhat better today, no nausea, vomiting, abdominal pain improving, headache improving, has a right-sided hematoma on the forehead. No active bleeding  Objective:   Blood pressure 143/59, pulse 65, temperature 97.8 F (36.6 C), temperature source Oral, resp. rate 18, height '5\' 1"'  (1.549 m), weight 57.9 kg (127 lb 10.3 oz), SpO2 98 %.  Wt Readings from Last 3 Encounters:  07/08/14 57.9 kg (127 lb 10.3 oz)     Intake/Output Summary (Last 24 hours) at 07/09/14 1022 Last data filed at 07/09/14 6962  Gross per 24 hour  Intake   1936 ml  Output      0 ml  Net   1936 ml    Exam  General: Alert and oriented x 3, NAD, feels miserable and uncomfortable   HEENT:  PERRLA, EOMI, Anicteic Sclera, mucous membranes moist.   Neck: Supple, no JVD, no masses  CVS: S1 S2 auscultated, no rubs, murmurs or gallops. Regular rate and rhythm.  Respiratory: Clear to auscultation bilaterally, no wheezing, rales or rhonchi  Abdomen: Soft, nontender, nondistended, + bowel sounds  Ext: no cyanosis clubbing or edema  Neuro: AAOx3, Cr N's II- XII. Strength 5/5 upper and lower extremities bilaterallySomewhat wobbly  on ambulation, finger to nose normal, Romberg sign negative   Skin: No rashes  Psych: Normal affect and demeanor, alert and oriented x3    Data Review   Micro Results Recent Results (from the past 240 hour(s))  Urine culture     Status: None (Preliminary result)   Collection Time: 07/07/14  6:34 AM  Result Value Ref Range Status   Specimen Description URINE, CLEAN CATCH  Final   Special Requests NONE  Final   Colony Count   Final    >=100,000 COLONIES/ML Performed at  [facility]    Culture   Final    GRAM NEGATIVE RODS Performed at [facility]    Report Status PENDING  Incomplete  Culture, blood (routine x 2)     Status: None (Preliminary result)   Collection Time: 07/07/14  8:46 AM  Result Value Ref Range Status   Specimen Description BLOOD RIGHT HAND  Final   Special Requests BOTTLES DRAWN AEROBIC ONLY 3ML  Final   Culture   Final           BLOOD CULTURE RECEIVED NO GROWTH TO DATE CULTURE WILL BE HELD FOR 5 DAYS BEFORE ISSUING A FINAL NEGATIVE REPORT Performed at [facility]    Report Status PENDING  Incomplete    Radiology Reports Ct Head Wo Contrast  07/07/2014   CLINICAL DATA:  Headache for 12 hr  EXAM: CT HEAD WITHOUT CONTRAST  TECHNIQUE: Contiguous axial images were obtained from the base of the skull through the vertex without intravenous contrast.  COMPARISON:  Head CT 10/02/2009  FINDINGS: No acute intracranial hemorrhage. No focal mass lesion. No CT evidence of acute infarction. No midline shift or mass effect. No hydrocephalus. Basilar cisterns are patent.  There is mild periventricular white matter hypodensities. Minimal cortical atrophy for age. Paranasal sinuses and mastoid air cells are clear. There is opacification of the sphenoid sinus with some sclerotic sinus wall thickening.  IMPRESSION: 1. No acute intracranial findings. 2. Mild periventricular white matter microvascular disease. 3. Chronic sphenoid sinus inflammation. 4.    Electronically Signed   By: Suzy Bouchard M.D.   On: 07/07/2014 07:25   Mr Brain Wo Contrast  07/08/2014   CLINICAL DATA:  81 year old hypertensive female with headache for 24 hours. No known injury. Subsequent encounter.  EXAM: MRI HEAD WITHOUT CONTRAST  TECHNIQUE: Multiplanar, multiecho pulse sequences of the brain and surrounding structures were obtained without intravenous contrast.  COMPARISON:  07/07/2014 and 10/02/2009 Head CT.  No comparison MR.  FINDINGS: No acute infarct.  No intracranial hemorrhage.  Small calcification right lateral aspect lower vermis unchanged from 2011 CT and most likely an incidental finding. Otherwise no evidence of intracranial mass lesion detected on this unenhanced exam.  Prominent small vessel disease type changes.  Complex opacification right sphenoid sinus. Moderate mucosal thickening left sphenoid sinus. Mucosal thickening/ partial opacification ethmoid sinus air cells.  Cervical medullary junction, pituitary region and pineal region unremarkable.  Post lens replacement with minimal exophthalmos.  Major intracranial vascular structures are patent.  IMPRESSION: Paranasal sinus opacification most notable involving the sphenoid sinus air cells as noted above.  No acute infarct.  Prominent small vessel disease type changes.  Nonspecific chronic calcification right lateral aspect of the lower vermis probably an incidental finding.   Electronically Signed   By: Genia Del M.D.   On: 07/08/2014 12:12   Ct Abdomen Pelvis W Contrast  07/07/2014   CLINICAL DATA:  Migraine, abdominal pain, emesis.  EXAM: CT ABDOMEN AND PELVIS WITH CONTRAST  TECHNIQUE: Multidetector CT imaging of the abdomen and pelvis was performed using the standard protocol following bolus administration of intravenous contrast.  CONTRAST:  23m OMNIPAQUE IOHEXOL 300 MG/ML  SOLN  COMPARISON:  CT 12/26/2011  FINDINGS: Lower chest: Lung bases are clear.  Hepatobiliary: No focal hepatic lesion. Patient status  post cholecystectomy. There is mild intrahepatic and extrahepatic biliary duct dilatation which is slightly increased from comparison exam. The mid common bile duct measures 8 mm compared 8 mm on prior.  Pancreas: Pancreas is  normal. No ductal dilatation. No pancreatic inflammation.  Spleen: Normal spleen  Adrenals/urinary tract: Adrenal glands are normal. Peripelvic cysts on the left. No hydronephrosis. No ureterolithiasis or obstructive uropathy. Normal renal cortical parenchyma. Tiny probable cysts within the right kidney.  Bladder is normal. There is laxity of the pelvic floor with the base of the bladder extending anterior to the vagina. The most inferior aspect of the bladder is not imaged.  Stomach/Bowel: Large hiatal hernia. Stomach, small bowel, cecum normal. There are multiple diverticula throughout the colon without acute inflammation. Heavy diverticular disease of the sigmoid colon.  Vascular/Lymphatic: Abdominal aorta is normal caliber with atherosclerotic calcification. There is no retroperitoneal or periportal lymphadenopathy. No pelvic lymphadenopathy.  Reproductive: Benign-appearing cyst associated with the right ovary measuring 22 mm this compares to 20 mm on CT of 12/26/2011  Musculoskeletal: There is a compression fracture the superior endplate of L3 which is new from 12/26/2011 but does not appear acute. Approximately 40% loss vertebral body. No significant retropulsion.  Other: No free fluid.  IMPRESSION: 1. Mild intrahepatic biliary duct dilatation with common bile duct dilatation is likely a combination of age and prior cholecystectomy. 2. Extensive diverticular disease most dense in the sigmoid colon without evidence acute diverticulitis. 3. Cystocele of the urinary bladder. 4. Compression fracture at L3 is new from 2013 but is likely chronic. 5. Moderate hiatal hernia.   Electronically Signed   By: Suzy Bouchard M.D.   On: 07/07/2014 07:51    CBC  Recent Labs Lab 07/07/14 0608  07/07/14 0640 07/08/14 0532  WBC 8.3  --  8.9  HGB 14.2 16.0* 12.2  HCT 43.4 47.0* 37.7  PLT 216  --  175  MCV 87.7  --  88.1  MCH 28.7  --  28.5  MCHC 32.7  --  32.4  RDW 13.9  --  13.9  LYMPHSABS 1.3  --   --   MONOABS 0.7  --   --   EOSABS 0.1  --   --   BASOSABS 0.0  --   --     Chemistries   Recent Labs Lab 07/07/14 0608 07/07/14 0640 07/08/14 0532 07/09/14 0517  NA 138 141 139 141  K 4.0 4.9 3.7 3.4*  CL 105 104 110 110  CO2 24  --  23 25  GLUCOSE 123* 125* 114* 87  BUN '10 11 8 10  ' CREATININE 0.59 0.60 0.55 0.59  CALCIUM 9.6  --  8.6 8.6  AST 22  --   --   --   ALT 18  --   --   --   ALKPHOS 60  --   --   --   BILITOT 0.5  --   --   --    ------------------------------------------------------------------------------------------------------------------ estimated creatinine clearance is 39 mL/min (by C-G formula based on Cr of 0.59). ------------------------------------------------------------------------------------------------------------------ No results for input(s): HGBA1C in the last 72 hours. ------------------------------------------------------------------------------------------------------------------ No results for input(s): CHOL, HDL, LDLCALC, TRIG, CHOLHDL, LDLDIRECT in the last 72 hours. ------------------------------------------------------------------------------------------------------------------ No results for input(s): TSH, T4TOTAL, T3FREE, THYROIDAB in the last 72 hours.  Invalid input(s): FREET3 ------------------------------------------------------------------------------------------------------------------ No results for input(s): VITAMINB12, FOLATE, FERRITIN, TIBC, IRON, RETICCTPCT in the last 72 hours.  Coagulation profile No results for input(s): INR, PROTIME in the last 168 hours.  No results for input(s): DDIMER in the last 72 hours.  Cardiac Enzymes No results for input(s): CKMB, TROPONINI, MYOGLOBIN in the last 168  hours.  Invalid input(s): CK ------------------------------------------------------------------------------------------------------------------ Invalid input(s): POCBNP  No  results for input(s): GLUCAP in the last 72 hours.   Niza Soderholm M.D. [facility] 07/09/2014, 10:22 AM  Pager: [phone]   Between 7am to 7pm - call Pager - [phone]  After 7pm go to [email or web address] - password [email or web address]  Call night coverage person covering after 7pm

## 2014-07-09 NOTE — Evaluation (Addendum)
Occupational Therapy Evaluation Patient Details Name: [name] [name] MRN: [id number] DOB: 08/17/1922 Today's Date: 07/09/2014    History of Present Illness Headache with nausea and vomiting, UTI, admitted 07/07/14.   Clinical Impression   Pt admitted with above. She demonstrates the below listed deficits and will benefit from continued OT to maximize safety and independence with BADLs.  Pt presents with generalized weakness.  She requires supervision with ADLs, but does fatigue.  She will have intermittent assist at discharge as 24 hour supervision is not available.  Optimally, recommend that she have 24 hour supervision initially due to her advanced age.         Follow Up Recommendations  No OT follow up;Supervision/Assistance - 24 hour (optimally )    Equipment Recommendations  None recommended by OT    Recommendations for Other Services       Precautions / Restrictions Precautions Precautions: Fall      Mobility Bed Mobility Overal bed mobility: Modified Independent                Transfers Overall transfer level: Modified independent Equipment used: None Transfers: Sit to/from [facility] Sit to Stand: Supervision Stand pivot transfers: Supervision            Balance Overall balance assessment: Needs assistance Sitting-balance support: Feet supported Sitting balance-Leahy Scale: Good     Standing balance support: During functional activity Standing balance-Leahy Scale: Good                              ADL Overall ADL's : Needs assistance/impaired Eating/Feeding: Independent   Grooming: Wash/dry hands;Wash/dry face;Oral care;Brushing hair;Supervision/safety;Standing   Upper Body Bathing: Set up;Standing   Lower Body Bathing: Supervison/ safety;Set up;Sit to/from stand   Upper Body Dressing : Set up;Supervision/safety;Standing   Lower Body Dressing: Supervision/safety;Set up;Sit to/from stand   Toilet Transfer:  Supervision/safety;Ambulation;Comfort height toilet   Toileting- Clothing Manipulation and Hygiene: Supervision/safety;Sit to/from stand   Tub/ Shower Transfer: Supervision/safety;Ambulation;Shower seat;Grab bars;Tub transfer Tub/Shower Transfer Details (indicate cue type and reason): simulated stepping over tub  Functional mobility during ADLs: Supervision/safety General ADL Comments: Pt is doing well with ADLs, but does fatigue.  She reports that she will have intermittent assist from her neighbors and church family, but will not have 24 hour assist/supervision.  Friends will assist with meals initially      Vision     Perception     Praxis      Pertinent Vitals/Pain Pain Assessment: 0-10 Pain Score: 4  Pain Location: headache Pain Descriptors / Indicators: Aching;Constant Pain Intervention(s): Monitored during session;Patient requesting pain meds-RN notified     Hand Dominance Right   Extremity/Trunk Assessment Upper Extremity Assessment Upper Extremity Assessment: Generalized weakness   Lower Extremity Assessment Lower Extremity Assessment: Defer to PT evaluation   Cervical / Trunk Assessment Cervical / Trunk Assessment: Kyphotic   Communication Communication Communication: HOH   Cognition Arousal/Alertness: Awake/alert Behavior During Therapy: WFL for tasks assessed/performed Overall Cognitive Status: Within Functional Limits for tasks assessed                     General Comments       Exercises       Shoulder Instructions      Home Living Family/patient expects to be discharged to:: Private residence Living Arrangements: Alone Available Help at Discharge: Family;Friend(s) Type of Home: House Home Access: Stairs to enter [employer] of Steps: 1  Home Layout: One level     Bathroom Shower/Tub: Tub/shower unit;Curtain Shower/tub characteristics: Architectural technologist: Standard     Home Equipment: Cane - single point;Walker  - 2 wheels;Shower seat;Grab bars - tub/shower          Prior Functioning/Environment Level of Independence: Independent        Comments: has RW  and cane but doesn't have to use them. Occasionally cane.     OT Diagnosis: Generalized weakness   OT Problem List: Decreased strength;Decreased activity tolerance   OT Treatment/Interventions: Self-care/ADL training;Patient/family education;Balance training    OT Goals(Current goals can be found in the care plan section) Acute Rehab OT Goals Patient Stated Goal: to go home  OT Goal Formulation: With patient Time For Goal Achievement: 07/16/14 Potential to Achieve Goals: Good ADL Goals Pt Will Perform Grooming: Independently;standing Pt Will Perform Upper Body Bathing: Independently;sitting Pt Will Perform Lower Body Bathing: Independently;sit to/from stand Pt Will Perform Upper Body Dressing: Independently;sitting Pt Will Perform Lower Body Dressing: Independently;sit to/from stand Pt Will Transfer to Toilet: Independently;ambulating;regular height toilet Pt Will Perform Toileting - Clothing Manipulation and hygiene: Independently;sit to/from stand Pt Will Perform Tub/Shower Transfer: Tub transfer;ambulating;shower seat;grab bars  OT Frequency: Min 2X/week   Barriers to D/C: Decreased caregiver support          Co-evaluation              End of Session Nurse Communication: Mobility status;Patient requests pain meds  Activity Tolerance: Patient tolerated treatment well Patient left: in chair;with call bell/phone within reach   Time: 0814 - 2831  OT Time Calculation (min): 27 minutes  Charges:  OT General Charges $OT Visit: 1 Procedure OT Evaluation $Initial OT Evaluation Tier I: 1 Procedure OT Treatments $Self Care/Home Management : 8-22 mins G-Codes:    [name] [name] M 07-26-2014, 9:29 AM

## 2014-07-09 NOTE — Progress Notes (Signed)
Utilization review completed.  

## 2014-07-10 ENCOUNTER — Inpatient Hospital Stay (HOSPITAL_COMMUNITY): Payer: Medicare Other

## 2014-07-10 DIAGNOSIS — F411 Generalized anxiety disorder: Secondary | ICD-10-CM

## 2014-07-10 DIAGNOSIS — K224 Dyskinesia of esophagus: Secondary | ICD-10-CM

## 2014-07-10 LAB — BASIC METABOLIC PANEL
Anion gap: 8 (ref 5–15)
BUN: 9 mg/dL (ref 6–23)
CALCIUM: 9.2 mg/dL (ref 8.4–10.5)
CO2: 27 mmol/L (ref 19–32)
Chloride: 109 mmol/L (ref 96–112)
Creatinine, Ser: 0.63 mg/dL (ref 0.50–1.10)
GFR calc Af Amer: 89 mL/min — ABNORMAL LOW (ref >90)
GFR calc non Af Amer: 77 mL/min — ABNORMAL LOW (ref >90)
GLUCOSE: 89 mg/dL (ref 70–99)
Potassium: 3.2 mmol/L — ABNORMAL LOW (ref 3.5–5.1)
SODIUM: 144 mmol/L (ref 135–145)

## 2014-07-10 LAB — URINE CULTURE

## 2014-07-10 MED ORDER — DILTIAZEM HCL ER COATED BEADS 120 MG PO CP24
120.0000 mg | ORAL_CAPSULE | Freq: Every day | ORAL | Status: DC
  Administered 2014-07-10: 120 mg via ORAL
  Filled 2014-07-10: qty 1

## 2014-07-10 MED ORDER — TRAMADOL HCL 50 MG PO TABS: 50.0000 mg | ORAL_TABLET | Freq: Three times a day (TID) | ORAL | Status: DC | PRN

## 2014-07-10 MED ORDER — ALPRAZOLAM 0.25 MG PO TABS: 0.2500 mg | ORAL_TABLET | Freq: Every day | ORAL | Status: DC

## 2014-07-10 MED ORDER — DILTIAZEM HCL ER COATED BEADS 120 MG PO CP24: 120.0000 mg | ORAL_CAPSULE | Freq: Every day | ORAL | Status: DC

## 2014-07-10 MED ORDER — POTASSIUM CHLORIDE CRYS ER 20 MEQ PO TBCR
40.0000 meq | EXTENDED_RELEASE_TABLET | Freq: Once | ORAL | Status: AC
  Administered 2014-07-10: 40 meq via ORAL
  Filled 2014-07-10: qty 2

## 2014-07-10 MED ORDER — DILTIAZEM HCL ER COATED BEADS 120 MG PO CP24
120.0000 mg | ORAL_CAPSULE | Freq: Every day | ORAL | Status: DC
Start: 2014-07-10 — End: 2014-07-10
  Filled 2014-07-10: qty 1

## 2014-07-10 MED ORDER — ESOMEPRAZOLE MAGNESIUM 40 MG PO CPDR: 40.0000 mg | DELAYED_RELEASE_CAPSULE | Freq: Two times a day (BID) | ORAL | Status: DC

## 2014-07-10 MED ORDER — TRAMADOL HCL 50 MG PO TABS
50.0000 mg | ORAL_TABLET | Freq: Once | ORAL | Status: AC
  Administered 2014-07-10: 50 mg via ORAL
  Filled 2014-07-10: qty 1

## 2014-07-10 MED ORDER — PROMETHAZINE HCL 12.5 MG PO TABS: 12.5000 mg | ORAL_TABLET | Freq: Four times a day (QID) | ORAL | Status: DC | PRN

## 2014-07-10 MED ORDER — ALPRAZOLAM 0.25 MG PO TABS
0.2500 mg | ORAL_TABLET | Freq: Every day | ORAL | Status: DC
  Administered 2014-07-10: 0.25 mg via ORAL
  Filled 2014-07-10: qty 1

## 2014-07-10 MED ORDER — ALPRAZOLAM 0.25 MG PO TABS: 0.2500 mg | ORAL_TABLET | Freq: Every day | ORAL | Status: DC | PRN

## 2014-07-10 MED ORDER — CEPHALEXIN 500 MG PO CAPS: 500.0000 mg | ORAL_CAPSULE | Freq: Two times a day (BID) | ORAL | Status: DC

## 2014-07-10 MED ORDER — METOCLOPRAMIDE HCL 5 MG PO TABS: 5.0000 mg | ORAL_TABLET | Freq: Three times a day (TID) | ORAL | Status: DC | PRN

## 2014-07-10 NOTE — Progress Notes (Signed)
CARE MANAGEMENT NOTE 07/10/2014  Patient:  [name] [name], [name] [name]   Account Number:  [id number]  Date Initiated:  07/10/2014  Documentation initiated by:  Edwyna Shell  Subjective/Objective Assessment:   81 yo female admitted with UTI from home alone     Action/Plan:   discharge planning   Anticipated DC Date:  07/10/2014   Anticipated DC Plan:  [place]  CM consult      [facility] Choice  HOME HEALTH   Choice offered to / List presented to:  C-1 Patient        [place] arranged  HH-1 RN  [place]      [place].   Status of service:  Completed, signed off Medicare Important Message given?  YES (If response is "NO", the following Medicare IM given date fields will be blank) Date Medicare IM given:  07/10/2014 Medicare IM given by:  Edwyna Shell Date Additional Medicare IM given:   Additional Medicare IM given by:    Discharge Disposition:  [place]  Per UR Regulation:    If discussed at Long Length of Stay Meetings, dates discussed:    Comments:  07/10/14 Edwyna Shell RN BSN CM [phone] Spoke with patient and she stated that she lives at home independently but has close friends and neighbors that support her in the community. She has a shower chair, walker, and cane but does not use the walker and cane. Patient stated that she has had [facility] in the past and would like to use their services again. HH referral communicated to [place], [facility] rep.

## 2014-07-10 NOTE — Progress Notes (Signed)
Occupational Therapy Treatment Patient Details Name: [name] [name] MRN: [id number] DOB: May 23, 1922 Today's Date: 07/10/2014    History of present illness Headache with nausea and vomiting, UTI, admitted 07/07/14.   OT comments  Pt making progress with functional goals and scheduled to d/c home today  Follow Up Recommendations  No OT follow up;Supervision/Assistance - 24 hour    Equipment Recommendations  None recommended by OT    Recommendations for Other Services      Precautions / Restrictions Precautions Precautions: Fall Restrictions Weight Bearing Restrictions: No       Mobility Bed Mobility Overal bed mobility: Modified Independent                Transfers Overall transfer level: Modified independent Equipment used: None Transfers: Sit to/from Stand Sit to Stand: Supervision Stand pivot transfers: Supervision       General transfer comment: steady assist    Balance Overall balance assessment: Needs assistance Sitting-balance support: No upper extremity supported;Feet supported Sitting balance-Leahy Scale: Good     Standing balance support: No upper extremity supported;During functional activity Standing balance-Leahy Scale: Good                     ADL       Grooming: Wash/dry hands;Wash/dry face;Supervision/safety;Standing;Modified independent   Upper Body Bathing: Standing;Modified independent;Supervision/ safety   Lower Body Bathing: Supervison/ safety;Set up;Sit to/from stand   Upper Body Dressing : Supervision/safety;Standing;Modified independent   Lower Body Dressing: Supervision/safety;Set up;Sit to/from stand   Toilet Transfer: Supervision/safety;Ambulation;Comfort height toilet   Toileting- Clothing Manipulation and Hygiene: Supervision/safety;Sit to/from stand;Modified independent   Tub/ Banker: Supervision/safety;Ambulation;Shower seat;Grab bars;Tub transfer   Functional mobility during ADLs:  Supervision/safety General ADL Comments: Pt is doing well with ADLs, but does fatigue.  She reports that she will have intermittent assist from her neighbors and church family, but will not have 24 hour assist/supervision.  Friends will assist with meals initially       Vision  no change from baseline                   Perception Perception Perception Tested?: No   Praxis Praxis Praxis tested?: Not tested    Cognition   Behavior During Therapy: WFL for tasks assessed/performed Overall Cognitive Status: Within Functional Limits for tasks assessed                       Extremity/Trunk Assessment   WFL                        General Comments  Pt pleasant cooperative    Pertinent Vitals/ Pain       Pain Assessment: 0-10  Home Living  at home alone                                        Prior Functioning/Environment  independent           Frequency Min 2X/week     Progress Toward Goals  OT Goals(current goals can now be found in the care plan section)  Progress towards OT goals: Progressing toward goals     Plan Discharge plan remains appropriate    End of Session     Activity Tolerance Patient tolerated treatment well   Patient Left in chair;with call bell/phone within reach   Nurse Communication  Time: 1202-1229 OT Time Calculation (min): 27 min  Charges: OT General Charges $OT Visit: 1 Procedure OT Treatments $Self Care/Home Management : 8-22 mins $Therapeutic Activity: 8-22 mins  Britt Bottom 07/10/2014, 2:01 PM

## 2014-07-10 NOTE — Discharge Summary (Signed)
Physician Discharge Summary   Patient ID: [name] [name] MRN: [id number] DOB/AGE: [birth year] 81 y.o.  Admit date: 07/07/2014 Discharge date: 07/10/2014  Primary Care Physician:  [name] Coco, MD  Discharge Diagnoses:   . Klebsiella UTI (urinary tract infection) . Diffuse esophageal spasm . Anxiety state . Headache . Intractable vomiting with nausea . Dehydration   Consults:   Gastroenterology via phone consultation   Recommendations for Outpatient Follow-up:  Discussed in detail with Ms [name] [name], from gastroenterology regarding diffuse esophageal spasm/motility disorder noted on a barium esophagogram. Outpatient follow-up scheduled on 07/18/14 at 2:30 PM.  New medications Cardizem 120 mg daily -> may increase to 180 mg daily if BP allows for diffuse esophageal spasm   Xanax 0.25 mg daily PRN   Omeprazole 40 mg twice a day, Reglan 5 mg 3 times a day as needed before the meals for nausea   TESTS THAT NEED FOLLOW-UP BMET   DIET:  Patient was recommended soft diet, full liquid diet and small bites, small meals, may decrease frequency of the meals. Sit upright for the meals.     Allergies:   Allergies  Allergen Reactions  . Codeine Nausea And Vomiting  . Crestor [Rosuvastatin Calcium]     myalgias  . Escitalopram Oxalate     Abdominal problem  . Ezetimibe-Simvastatin     myalgias     Discharge Medications:   Medication List    STOP taking these medications        dexlansoprazole 60 MG capsule  Commonly known as:  DEXILANT     losartan 50 MG tablet  Commonly known as:  COZAAR      TAKE these medications        ALPRAZolam 0.25 MG tablet  Commonly known as:  XANAX  Take 1 tablet (0.25 mg total) by mouth daily as needed for anxiety.     aspirin EC 81 MG EC tablet  Generic drug:  aspirin  Take 81 mg by mouth daily.     BENEFIBER PO  Take 1 tablet by mouth daily as needed (constipation).     CALCIUM 600 PO  Take by mouth 2 (two) times  daily.     cephALEXin 500 MG capsule  Commonly known as:  KEFLEX  Take 1 capsule (500 mg total) by mouth 2 (two) times daily. X 4 days  Start taking on:  07/11/2014     diltiazem 120 MG 24 hr capsule  Commonly known as:  CARDIZEM CD  Take 1 capsule (120 mg total) by mouth daily.     esomeprazole 40 MG capsule  Commonly known as:  NEXIUM  Take 1 capsule (40 mg total) by mouth 2 (two) times daily before a meal.     ezetimibe 10 MG tablet  Commonly known as:  ZETIA  Take 10 mg by mouth daily.     magnesium 30 MG tablet  Take 30 mg by mouth 2 (two) times daily.     magnesium hydroxide 400 MG/5ML suspension  Commonly known as:  MILK OF MAGNESIA  Take 30 mLs by mouth daily as needed for mild constipation.     metoCLOPramide 5 MG tablet  Commonly known as:  REGLAN  Take 1 tablet (5 mg total) by mouth 3 (three) times daily with meals as needed for nausea (take 6mns before meals).     multivitamin tablet  Take 1 tablet by mouth daily.     olmesartan 20 MG tablet  Commonly known as:  BENICAR  Take 10  mg by mouth daily.     promethazine 12.5 MG tablet  Commonly known as:  PHENERGAN  Take 1 tablet (12.5 mg total) by mouth every 6 (six) hours as needed for nausea or vomiting.     traMADol 50 MG tablet  Commonly known as:  ULTRAM  Take 1 tablet (50 mg total) by mouth every 8 (eight) hours as needed for moderate pain (headache).     TYLENOL PO  Take 1,000 mg by mouth every 4 (four) hours as needed (pain). As needed     zinc gluconate 50 MG tablet  Take 50 mg by mouth daily.         Brief H and P: For complete details please refer to admission H and P, but in briefPatient is a 81 year old female with hyperlipidemia, hypertension, irritable bowel syndrome, GERD, chronic tension headaches who presented with intractable headache, nausea, vomiting, epigastric and left-sided abdominal pain since yesterday. History was obtained from the patient and the daughter present in the room.  Patient reported that around 5 PM the day prior to admission, she had developed headache, frontal and radiating to the back of her head. Patient's daughter called the PCP who recommended to take 2 additional Tylenol and see in office in the morning. However, on the morning of admission, patient also complained of having nausea and dry heaves and pain in the epigastric and left side of her abdomen. She denied any diarrhea. Headache was about 7 out of 10, improved to 4 out of 10 in ed, no visual disturbance, focal weakness, neck stiffness or photophobia or phonophobia. No fevers. Patient reports that she has been unable to hold any food down due to nausea. Patient lives at home alone and her daughter lives on the daughter on the opposite side of the street. Otherwise functional. At the time of my examination in ed, patient reports that abdominal pain has resolved. UA was positive for UTI, CT head was negative for any intracranial abnormalities. CT abdomen and pelvis showed mild intrahepatic biliary ductal dilatation with a CBD dilatation likely a combination of age and prior cholecystectomy, extensive diverticular disease but no acute diverticulitis.  Hospital Course:   Klebsiella UTI (urinary tract infection) Patient was placed on IV Rocephin, she has received 3 days of IV Rocephin prior to discharge. Urine culture showed Klebsiella, sensitive to Keflex,. Patient was discharged on 4 days of oral Keflex. QTC is borderline high, hence to avoid fluoroquinolones.    Headache: The patient has history of chronic tension headaches and likely exacerbated due to nausea and vomiting and UTI. She has no visual disturbance, focal neurological deficits, or meningeal signs. ESR , CRP normal rules out giant cell arteritis. MRI of the brain was negative for acute CVA.  Hematoma right forehead - Patient may have had a fall prior to admission, hematoma showing up now, she also reports easy bruising, Currently stable    Intractable vomiting with nausea with abdominal pain: Due to diffuse esophageal spasm. Patient was admitted for further workup. CT abdomen and pelvis was negative for acute diverticulitis or any other acute intra-abdominal pathology, small bowel obstruction. Patient was placed on IV fluids, IV PPI, Reglan, IV Zofran. Patient did have some difficulty on advancing the diet to solids. Barium esophagogram was obtained which showed severe tertiary contractions/spasm in the distal esophagus with corkscrew appearance with small hiatal hernia. I spoke with gastroenterology, Ms. Amy Easterwood who recommended no role of endoscopy at this time, small frequent meals, lifestyle changes and decrease stress.  Patient also reported that she is under significant stress due to her son who has mental retardation. She does not want to go to any skilled nursing facility or assisted living facility. She used to take Xanax at home which she doesn't take now. I started the patient on Cardizem 120 mg daily/calcium channel blocker for the diffuse esophageal spasm, low-dose Xanax as needed at home and discussed in detail regarding dietary recommendations per gastroenterology. Patient can also take Reglan as needed for nausea. Increased PPI omeprazole to 40 mg twice daily before meals. Gastroenterology follow-up is scheduled with Ms. Ripley on 07/18/14.   Dehydration: Patient was placed on IV fluid hydration.   Anxiety  Placed on low-dose Xanax as needed at home  Generalized weakness Patient does not want to go to any skilled nursing facility or assisted living facility for now. PT evaluation was done and recommended Home PT. Home health PT, OT, home health aide and RN was arranged. Patient also reports that she has a lot of support from family and friends who check up on her daily.  Day of Discharge BP 172/89 mmHg  Pulse 86  Temp(Src) 97.8 F (36.6 C) (Oral)  Resp 18  Ht _0  (1.549 m)  Wt 57.9 kg (127 lb 10.3 oz)   BMI 24.13 kg/m2  SpO2 97%  Physical Exam: General: Alert and awake oriented x3 not in any acute distress. HEENT: anicteric sclera, pupils reactive to light and accommodation, Mild hematoma on the right forehead  CVS: S1-S2 clear no murmur rubs or gallops Chest: clear to auscultation bilaterally, no wheezing rales or rhonchi Abdomen: soft nontender, nondistended, normal bowel sounds Extremities: no cyanosis, clubbing or edema noted bilaterally Neuro: Cranial nerves II-XII intact, no focal neurological deficits   The results of significant diagnostics from this hospitalization (including imaging, microbiology, ancillary and laboratory) are listed below for reference.    LAB RESULTS: Basic Metabolic Panel:  Recent Labs Lab 07/09/14 0517 07/10/14 0515  NA 141 144  K 3.4* 3.2*  CL 110 109  CO2 25 27  GLUCOSE 87 89  BUN 10 9  CREATININE 0.59 0.63  CALCIUM 8.6 9.2   Liver Function Tests:  Recent Labs Lab 07/07/14 0608  AST 22  ALT 18  ALKPHOS 60  BILITOT 0.5  PROT 7.3  ALBUMIN 4.2    Recent Labs Lab 07/07/14 0608  LIPASE 30   No results for input(s): AMMONIA in the last 168 hours. CBC:  Recent Labs Lab 07/07/14 0608 07/07/14 0640 07/08/14 0532  WBC 8.3  --  8.9  NEUTROABS 6.2  --   --   HGB 14.2 16.0* 12.2  HCT 43.4 47.0* 37.7  MCV 87.7  --  88.1  PLT 216  --  175   Cardiac Enzymes: No results for input(s): CKTOTAL, CKMB, CKMBINDEX, TROPONINI in the last 168 hours. BNP: Invalid input(s): POCBNP CBG: No results for input(s): GLUCAP in the last 168 hours.  Significant Diagnostic Studies:  Ct Head Wo Contrast  07/07/2014   CLINICAL DATA:  Headache for 12 hr  EXAM: CT HEAD WITHOUT CONTRAST  TECHNIQUE: Contiguous axial images were obtained from the base of the skull through the vertex without intravenous contrast.  COMPARISON:  Head CT 10/02/2009  FINDINGS: No acute intracranial hemorrhage. No focal mass lesion. No CT evidence of acute infarction. No  midline shift or mass effect. No hydrocephalus. Basilar cisterns are patent.  There is mild periventricular white matter hypodensities. Minimal cortical atrophy for age. Paranasal sinuses and mastoid air  cells are clear. There is opacification of the sphenoid sinus with some sclerotic sinus wall thickening.  IMPRESSION: 1. No acute intracranial findings. 2. Mild periventricular white matter microvascular disease. 3. Chronic sphenoid sinus inflammation. 4.   Electronically Signed   By: Suzy Bouchard M.D.   On: 07/07/2014 07:25   Mr Brain Wo Contrast  07/08/2014   CLINICAL DATA:  81 year old hypertensive female with headache for 24 hours. No known injury. Subsequent encounter.  EXAM: MRI HEAD WITHOUT CONTRAST  TECHNIQUE: Multiplanar, multiecho pulse sequences of the brain and surrounding structures were obtained without intravenous contrast.  COMPARISON:  07/07/2014 and 10/02/2009 Head CT.  No comparison MR.  FINDINGS: No acute infarct.  No intracranial hemorrhage.  Small calcification right lateral aspect lower vermis unchanged from 2011 CT and most likely an incidental finding. Otherwise no evidence of intracranial mass lesion detected on this unenhanced exam.  Prominent small vessel disease type changes.  Complex opacification right sphenoid sinus. Moderate mucosal thickening left sphenoid sinus. Mucosal thickening/ partial opacification ethmoid sinus air cells.  Cervical medullary junction, pituitary region and pineal region unremarkable.  Post lens replacement with minimal exophthalmos.  Major intracranial vascular structures are patent.  IMPRESSION: Paranasal sinus opacification most notable involving the sphenoid sinus air cells as noted above.  No acute infarct.  Prominent small vessel disease type changes.  Nonspecific chronic calcification right lateral aspect of the lower vermis probably an incidental finding.   Electronically Signed   By: Genia Del M.D.   On: 07/08/2014 12:12   Ct Abdomen Pelvis  W Contrast  07/07/2014   CLINICAL DATA:  Migraine, abdominal pain, emesis.  EXAM: CT ABDOMEN AND PELVIS WITH CONTRAST  TECHNIQUE: Multidetector CT imaging of the abdomen and pelvis was performed using the standard protocol following bolus administration of intravenous contrast.  CONTRAST:  34m OMNIPAQUE IOHEXOL 300 MG/ML  SOLN  COMPARISON:  CT 12/26/2011  FINDINGS: Lower chest: Lung bases are clear.  Hepatobiliary: No focal hepatic lesion. Patient status post cholecystectomy. There is mild intrahepatic and extrahepatic biliary duct dilatation which is slightly increased from comparison exam. The mid common bile duct measures 8 mm compared 8 mm on prior.  Pancreas: Pancreas is normal. No ductal dilatation. No pancreatic inflammation.  Spleen: Normal spleen  Adrenals/urinary tract: Adrenal glands are normal. Peripelvic cysts on the left. No hydronephrosis. No ureterolithiasis or obstructive uropathy. Normal renal cortical parenchyma. Tiny probable cysts within the right kidney.  Bladder is normal. There is laxity of the pelvic floor with the base of the bladder extending anterior to the vagina. The most inferior aspect of the bladder is not imaged.  Stomach/Bowel: Large hiatal hernia. Stomach, small bowel, cecum normal. There are multiple diverticula throughout the colon without acute inflammation. Heavy diverticular disease of the sigmoid colon.  Vascular/Lymphatic: Abdominal aorta is normal caliber with atherosclerotic calcification. There is no retroperitoneal or periportal lymphadenopathy. No pelvic lymphadenopathy.  Reproductive: Benign-appearing cyst associated with the right ovary measuring 22 mm this compares to 20 mm on CT of 12/26/2011  Musculoskeletal: There is a compression fracture the superior endplate of L3 which is new from 12/26/2011 but does not appear acute. Approximately 40% loss vertebral body. No significant retropulsion.  Other: No free fluid.  IMPRESSION: 1. Mild intrahepatic biliary duct  dilatation with common bile duct dilatation is likely a combination of age and prior cholecystectomy. 2. Extensive diverticular disease most dense in the sigmoid colon without evidence acute diverticulitis. 3. Cystocele of the urinary bladder. 4. Compression fracture at L3  is new from 2013 but is likely chronic. 5. Moderate hiatal hernia.   Electronically Signed   By: Suzy Bouchard M.D.   On: 07/07/2014 07:51    2D ECHO:   Disposition and Follow-up: Discharge Instructions    Diet - low sodium heart healthy    Complete by:  As directed      Diet - low sodium heart healthy    Complete by:  As directed      Discharge instructions    Complete by:  As directed   DISCHARGE DIET: SOFT SMALL MEALS, OR FULL LIQUID, SMALL BITES. Always sit upright while eating. Take Omeprazole twice a day before meals. You are started on cardizem which helps with the spasm of the esophagus.     Increase activity slowly    Complete by:  As directed      Increase activity slowly    Complete by:  As directed             DISPOSITION: Home  DISCHARGE FOLLOW-UP Follow-up Information    Follow up with [name] Coco, MD. Schedule an appointment as soon as possible for a visit on 07/20/2014.   Specialty:  Cardiology   Why:  Please follow up with Dr. Mare Ferrari on    Contact information:   [address] [place] [address] [phone]       Follow up with Nicoletta Ba, PA-C On 07/18/2014.   Specialty:  Gastroenterology   Why:  at 2;30 pm   Contact information:   [address] [place] [address] [phone]        Time spent on Discharge: 35 mins  Signed:   RAI,RIPUDEEP M.D. [facility] 07/10/2014, 11:40 AM Pager: [phone]

## 2014-07-10 NOTE — Progress Notes (Signed)
Discharge instructions given to pt, verbalized understanding. Left the unit in stable condition. 

## 2014-07-12 DIAGNOSIS — I10 Essential (primary) hypertension: Secondary | ICD-10-CM | POA: Diagnosis not present

## 2014-07-12 DIAGNOSIS — B961 Klebsiella pneumoniae [K. pneumoniae] as the cause of diseases classified elsewhere: Secondary | ICD-10-CM | POA: Diagnosis not present

## 2014-07-12 DIAGNOSIS — N390 Urinary tract infection, site not specified: Secondary | ICD-10-CM | POA: Diagnosis not present

## 2014-07-12 DIAGNOSIS — F419 Anxiety disorder, unspecified: Secondary | ICD-10-CM | POA: Diagnosis not present

## 2014-07-12 DIAGNOSIS — R269 Unspecified abnormalities of gait and mobility: Secondary | ICD-10-CM | POA: Diagnosis not present

## 2014-07-12 DIAGNOSIS — K224 Dyskinesia of esophagus: Secondary | ICD-10-CM | POA: Diagnosis not present

## 2014-07-13 DIAGNOSIS — N390 Urinary tract infection, site not specified: Secondary | ICD-10-CM | POA: Diagnosis not present

## 2014-07-13 DIAGNOSIS — F419 Anxiety disorder, unspecified: Secondary | ICD-10-CM | POA: Diagnosis not present

## 2014-07-13 DIAGNOSIS — K224 Dyskinesia of esophagus: Secondary | ICD-10-CM | POA: Diagnosis not present

## 2014-07-13 DIAGNOSIS — I10 Essential (primary) hypertension: Secondary | ICD-10-CM | POA: Diagnosis not present

## 2014-07-13 DIAGNOSIS — B961 Klebsiella pneumoniae [K. pneumoniae] as the cause of diseases classified elsewhere: Secondary | ICD-10-CM | POA: Diagnosis not present

## 2014-07-13 DIAGNOSIS — R269 Unspecified abnormalities of gait and mobility: Secondary | ICD-10-CM | POA: Diagnosis not present

## 2014-07-13 LAB — CULTURE, BLOOD (ROUTINE X 2): CULTURE: NO GROWTH

## 2014-07-15 DIAGNOSIS — N390 Urinary tract infection, site not specified: Secondary | ICD-10-CM | POA: Diagnosis not present

## 2014-07-15 DIAGNOSIS — I10 Essential (primary) hypertension: Secondary | ICD-10-CM | POA: Diagnosis not present

## 2014-07-15 DIAGNOSIS — R269 Unspecified abnormalities of gait and mobility: Secondary | ICD-10-CM | POA: Diagnosis not present

## 2014-07-15 DIAGNOSIS — K224 Dyskinesia of esophagus: Secondary | ICD-10-CM | POA: Diagnosis not present

## 2014-07-15 DIAGNOSIS — B961 Klebsiella pneumoniae [K. pneumoniae] as the cause of diseases classified elsewhere: Secondary | ICD-10-CM | POA: Diagnosis not present

## 2014-07-15 DIAGNOSIS — F419 Anxiety disorder, unspecified: Secondary | ICD-10-CM | POA: Diagnosis not present

## 2014-07-17 DIAGNOSIS — I10 Essential (primary) hypertension: Secondary | ICD-10-CM | POA: Diagnosis not present

## 2014-07-17 DIAGNOSIS — F419 Anxiety disorder, unspecified: Secondary | ICD-10-CM | POA: Diagnosis not present

## 2014-07-17 DIAGNOSIS — B961 Klebsiella pneumoniae [K. pneumoniae] as the cause of diseases classified elsewhere: Secondary | ICD-10-CM | POA: Diagnosis not present

## 2014-07-17 DIAGNOSIS — R269 Unspecified abnormalities of gait and mobility: Secondary | ICD-10-CM | POA: Diagnosis not present

## 2014-07-17 DIAGNOSIS — K224 Dyskinesia of esophagus: Secondary | ICD-10-CM | POA: Diagnosis not present

## 2014-07-17 DIAGNOSIS — N390 Urinary tract infection, site not specified: Secondary | ICD-10-CM | POA: Diagnosis not present

## 2014-07-18 ENCOUNTER — Encounter: Payer: Self-pay | Admitting: Physician Assistant

## 2014-07-18 ENCOUNTER — Ambulatory Visit (INDEPENDENT_AMBULATORY_CARE_PROVIDER_SITE_OTHER): Payer: Medicare Other | Admitting: Physician Assistant

## 2014-07-18 VITALS — BP 132/60 | HR 84 | Ht 61.0 in | Wt 119.2 lb

## 2014-07-18 DIAGNOSIS — F419 Anxiety disorder, unspecified: Secondary | ICD-10-CM | POA: Diagnosis not present

## 2014-07-18 DIAGNOSIS — R269 Unspecified abnormalities of gait and mobility: Secondary | ICD-10-CM | POA: Diagnosis not present

## 2014-07-18 DIAGNOSIS — B961 Klebsiella pneumoniae [K. pneumoniae] as the cause of diseases classified elsewhere: Secondary | ICD-10-CM | POA: Diagnosis not present

## 2014-07-18 DIAGNOSIS — R1314 Dysphagia, pharyngoesophageal phase: Secondary | ICD-10-CM | POA: Diagnosis not present

## 2014-07-18 DIAGNOSIS — K224 Dyskinesia of esophagus: Secondary | ICD-10-CM | POA: Diagnosis not present

## 2014-07-18 DIAGNOSIS — I10 Essential (primary) hypertension: Secondary | ICD-10-CM | POA: Diagnosis not present

## 2014-07-18 DIAGNOSIS — N390 Urinary tract infection, site not specified: Secondary | ICD-10-CM | POA: Diagnosis not present

## 2014-07-18 NOTE — Patient Instructions (Signed)
Eat a soft diet,. Be upright while eating meals and for 1 hour after the meal.  Follow up as needed.

## 2014-07-18 NOTE — Progress Notes (Signed)
Patient ID: [name] [name], female   DOB: 02-Jan-1923, 81 y.o.   MRN: [id number]   Subjective:    Patient ID: [name] [name], female    DOB: [birth year], 81 y.o.   MRN: [id number]  HPI [name] [name] is a pleasant 81 year old white female, new to GI and referred by [facility] hospitalist  Dr Tana Coast ,after recent hospitalization for a urinary tract infection. Patient had mentioned that she was having difficulty swallowing and that was actually a bit worse while she was nauseated and vomiting with the UTI. She underwent a barium swallow would severe tertiary contractions and spasm in the distal esophagus which has a corkscrew appearance and no fixed stricture. Patient states that she had been having some trouble swallowing for quite a while may be a little bit worse over the past couple of months. She does not have symptoms on an every meal or necessarily every day basis. She says she has trouble with solid foods sometimes feeling like there hanging up and occasionally also with liquids gurgling in her esophagus. She is generally not regurgitating. She feels that she is eating much better at this point and has been trying 5-6 small meals per day and sticks to a very soft food because she had her teeth removed 2 years ago as well. Patient states she's not sure why she was referred here because she was given the results of her barium swallow prior to leaving the hospital and did not think we would need to do any procedures etc.  Review of Systems Pertinent positive and negative review of systems were noted in the above HPI section.  All other review of systems was otherwise negative.  Outpatient Encounter Prescriptions as of 07/18/2014  Medication Sig  . Acetaminophen (TYLENOL PO) Take 1,000 mg by mouth every 4 (four) hours as needed (pain). As needed  . ALPRAZolam (XANAX) 0.25 MG tablet Take 1 tablet (0.25 mg total) by mouth daily as needed for anxiety.  Marland Kitchen aspirin (ASPIRIN EC) 81 MG EC tablet Take 81 mg by mouth daily.    .  Calcium Carbonate (CALCIUM 600 PO) Take by mouth 2 (two) times daily.    Marland Kitchen diltiazem (CARDIZEM CD) 120 MG 24 hr capsule Take 1 capsule (120 mg total) by mouth daily.  Marland Kitchen esomeprazole (NEXIUM) 40 MG capsule Take 1 capsule (40 mg total) by mouth 2 (two) times daily before a meal.  . ezetimibe (ZETIA) 10 MG tablet Take 10 mg by mouth daily.    Marland Kitchen losartan (COZAAR) 50 MG tablet Take 50 mg by mouth daily.   . magnesium 30 MG tablet Take 30 mg by mouth 2 (two) times daily.  . magnesium hydroxide (MILK OF MAGNESIA) 400 MG/5ML suspension Take 30 mLs by mouth as needed for mild constipation.   . metoCLOPramide (REGLAN) 5 MG tablet Take 1 tablet (5 mg total) by mouth 3 (three) times daily with meals as needed for nausea (take 27mins before meals).  . olmesartan (BENICAR) 20 MG tablet Take 10 mg by mouth daily.   . promethazine (PHENERGAN) 12.5 MG tablet Take 1 tablet (12.5 mg total) by mouth every 6 (six) hours as needed for nausea or vomiting.  . psyllium (METAMUCIL) 58.6 % powder Take 1 packet by mouth daily. Pt takes 1 teaspoon, in water, every day  . traMADol (ULTRAM) 50 MG tablet Take 1 tablet (50 mg total) by mouth every 8 (eight) hours as needed for moderate pain (headache).  . [DISCONTINUED] cephALEXin (KEFLEX) 500 MG capsule Take 1 capsule (  500 mg total) by mouth 2 (two) times daily. X 4 days  . [DISCONTINUED] Multiple Vitamin (MULTIVITAMIN) tablet Take 1 tablet by mouth daily.    . [DISCONTINUED] Wheat Dextrin (BENEFIBER PO) Take 1 tablet by mouth daily as needed (constipation).  . [DISCONTINUED] zinc gluconate 50 MG tablet Take 50 mg by mouth daily.   Allergies  Allergen Reactions  . Codeine Nausea And Vomiting  . Crestor [Rosuvastatin Calcium]     myalgias  . Escitalopram Oxalate     Abdominal problem  . Ezetimibe-Simvastatin     myalgias   Patient Active Problem List   Diagnosis Date Noted  . Esophageal dysmotility 07/18/2014  . Anxiety state 07/10/2014  . Headache 07/07/2014  .  Intractable vomiting with nausea 07/07/2014  . Dehydration 07/07/2014  . UTI (urinary tract infection) 07/07/2014  . History of depression   . History of gallstones   . Hip fracture   . Hip fracture   . History of vertigo   . Arthritis   . RBBB (right bundle branch block)   . History of IBS    History   Social History  . Marital Status: Widowed    Spouse Name: N/A  . Number of Children: 3  . Years of Education: N/A   Occupational History  . Retired    Social History Main Topics  . Smoking status: Never Smoker   . Smokeless tobacco: Never Used  . Alcohol Use: No  . Drug Use: No  . Sexual Activity: Not on file   Other Topics Concern  . Not on file   Social History Narrative    [name] [name]'s family history includes Colon cancer in her sister; Heart disease in her brother; Kidney disease in her brother; Other in her father; Tuberculosis in her mother. There is no history of Colon polyps, Diabetes, or Esophageal cancer.      Objective:    Filed Vitals:   07/18/14 1423  BP: 132/60  Pulse: 84    Physical Exam  well-developed elderly white female in no acute distress quite pleasant blood pressure 132/60 pulse 84 height 5 foot 1 weight 119. HEENT nontraumatic normocephalic EOMI PERRLA sclera anicteric Not further examined today discussion only       Assessment & Plan:   #1 81 yo female with intermittent solid food dysphagia, secondary to esophageal dysmotility and corkscrew esophagus #2 recent UTI #3 anxiety  Plan; Reviewed careful chewing, small bites, ground or chopped meats, upright positioning for meals and 1-2 hours post meals. She is advised to return to GI for progressive symptoms  Amy Genia Harold PA-C 07/18/2014   Cc: [name] Coco, MD

## 2014-07-19 DIAGNOSIS — K224 Dyskinesia of esophagus: Secondary | ICD-10-CM | POA: Diagnosis not present

## 2014-07-19 DIAGNOSIS — B961 Klebsiella pneumoniae [K. pneumoniae] as the cause of diseases classified elsewhere: Secondary | ICD-10-CM | POA: Diagnosis not present

## 2014-07-19 DIAGNOSIS — R269 Unspecified abnormalities of gait and mobility: Secondary | ICD-10-CM | POA: Diagnosis not present

## 2014-07-19 DIAGNOSIS — N390 Urinary tract infection, site not specified: Secondary | ICD-10-CM | POA: Diagnosis not present

## 2014-07-19 DIAGNOSIS — F419 Anxiety disorder, unspecified: Secondary | ICD-10-CM | POA: Diagnosis not present

## 2014-07-19 DIAGNOSIS — I10 Essential (primary) hypertension: Secondary | ICD-10-CM | POA: Diagnosis not present

## 2014-07-19 NOTE — Progress Notes (Signed)
I agree with the above note, plan 

## 2014-07-21 DIAGNOSIS — R269 Unspecified abnormalities of gait and mobility: Secondary | ICD-10-CM | POA: Diagnosis not present

## 2014-07-21 DIAGNOSIS — I10 Essential (primary) hypertension: Secondary | ICD-10-CM | POA: Diagnosis not present

## 2014-07-21 DIAGNOSIS — F419 Anxiety disorder, unspecified: Secondary | ICD-10-CM | POA: Diagnosis not present

## 2014-07-21 DIAGNOSIS — N390 Urinary tract infection, site not specified: Secondary | ICD-10-CM | POA: Diagnosis not present

## 2014-07-21 DIAGNOSIS — B961 Klebsiella pneumoniae [K. pneumoniae] as the cause of diseases classified elsewhere: Secondary | ICD-10-CM | POA: Diagnosis not present

## 2014-07-21 DIAGNOSIS — K224 Dyskinesia of esophagus: Secondary | ICD-10-CM | POA: Diagnosis not present

## 2014-07-24 DIAGNOSIS — F419 Anxiety disorder, unspecified: Secondary | ICD-10-CM | POA: Diagnosis not present

## 2014-07-24 DIAGNOSIS — I10 Essential (primary) hypertension: Secondary | ICD-10-CM | POA: Diagnosis not present

## 2014-07-24 DIAGNOSIS — N390 Urinary tract infection, site not specified: Secondary | ICD-10-CM | POA: Diagnosis not present

## 2014-07-24 DIAGNOSIS — K224 Dyskinesia of esophagus: Secondary | ICD-10-CM | POA: Diagnosis not present

## 2014-07-24 DIAGNOSIS — R269 Unspecified abnormalities of gait and mobility: Secondary | ICD-10-CM | POA: Diagnosis not present

## 2014-07-24 DIAGNOSIS — B961 Klebsiella pneumoniae [K. pneumoniae] as the cause of diseases classified elsewhere: Secondary | ICD-10-CM | POA: Diagnosis not present

## 2014-07-25 DIAGNOSIS — K224 Dyskinesia of esophagus: Secondary | ICD-10-CM | POA: Diagnosis not present

## 2014-07-25 DIAGNOSIS — N390 Urinary tract infection, site not specified: Secondary | ICD-10-CM | POA: Diagnosis not present

## 2014-07-25 DIAGNOSIS — F419 Anxiety disorder, unspecified: Secondary | ICD-10-CM | POA: Diagnosis not present

## 2014-07-25 DIAGNOSIS — R269 Unspecified abnormalities of gait and mobility: Secondary | ICD-10-CM | POA: Diagnosis not present

## 2014-07-25 DIAGNOSIS — I10 Essential (primary) hypertension: Secondary | ICD-10-CM | POA: Diagnosis not present

## 2014-07-25 DIAGNOSIS — B961 Klebsiella pneumoniae [K. pneumoniae] as the cause of diseases classified elsewhere: Secondary | ICD-10-CM | POA: Diagnosis not present

## 2014-07-27 DIAGNOSIS — R269 Unspecified abnormalities of gait and mobility: Secondary | ICD-10-CM | POA: Diagnosis not present

## 2014-07-27 DIAGNOSIS — B961 Klebsiella pneumoniae [K. pneumoniae] as the cause of diseases classified elsewhere: Secondary | ICD-10-CM | POA: Diagnosis not present

## 2014-07-27 DIAGNOSIS — F419 Anxiety disorder, unspecified: Secondary | ICD-10-CM | POA: Diagnosis not present

## 2014-07-27 DIAGNOSIS — I10 Essential (primary) hypertension: Secondary | ICD-10-CM | POA: Diagnosis not present

## 2014-07-27 DIAGNOSIS — N390 Urinary tract infection, site not specified: Secondary | ICD-10-CM | POA: Diagnosis not present

## 2014-07-27 DIAGNOSIS — K224 Dyskinesia of esophagus: Secondary | ICD-10-CM | POA: Diagnosis not present

## 2014-07-31 DIAGNOSIS — R269 Unspecified abnormalities of gait and mobility: Secondary | ICD-10-CM | POA: Diagnosis not present

## 2014-07-31 DIAGNOSIS — K224 Dyskinesia of esophagus: Secondary | ICD-10-CM | POA: Diagnosis not present

## 2014-07-31 DIAGNOSIS — I10 Essential (primary) hypertension: Secondary | ICD-10-CM | POA: Diagnosis not present

## 2014-07-31 DIAGNOSIS — N390 Urinary tract infection, site not specified: Secondary | ICD-10-CM | POA: Diagnosis not present

## 2014-07-31 DIAGNOSIS — F419 Anxiety disorder, unspecified: Secondary | ICD-10-CM | POA: Diagnosis not present

## 2014-07-31 DIAGNOSIS — B961 Klebsiella pneumoniae [K. pneumoniae] as the cause of diseases classified elsewhere: Secondary | ICD-10-CM | POA: Diagnosis not present

## 2014-08-03 DIAGNOSIS — N390 Urinary tract infection, site not specified: Secondary | ICD-10-CM | POA: Diagnosis not present

## 2014-08-03 DIAGNOSIS — K224 Dyskinesia of esophagus: Secondary | ICD-10-CM | POA: Diagnosis not present

## 2014-08-03 DIAGNOSIS — R269 Unspecified abnormalities of gait and mobility: Secondary | ICD-10-CM | POA: Diagnosis not present

## 2014-08-03 DIAGNOSIS — I10 Essential (primary) hypertension: Secondary | ICD-10-CM | POA: Diagnosis not present

## 2014-08-03 DIAGNOSIS — B961 Klebsiella pneumoniae [K. pneumoniae] as the cause of diseases classified elsewhere: Secondary | ICD-10-CM | POA: Diagnosis not present

## 2014-08-03 DIAGNOSIS — F419 Anxiety disorder, unspecified: Secondary | ICD-10-CM | POA: Diagnosis not present

## 2014-08-14 DIAGNOSIS — K5900 Constipation, unspecified: Secondary | ICD-10-CM | POA: Diagnosis not present

## 2014-08-14 DIAGNOSIS — K219 Gastro-esophageal reflux disease without esophagitis: Secondary | ICD-10-CM | POA: Diagnosis not present

## 2014-08-14 DIAGNOSIS — Z79899 Other long term (current) drug therapy: Secondary | ICD-10-CM | POA: Diagnosis not present

## 2014-08-14 DIAGNOSIS — I10 Essential (primary) hypertension: Secondary | ICD-10-CM | POA: Diagnosis not present

## 2014-08-17 DIAGNOSIS — F419 Anxiety disorder, unspecified: Secondary | ICD-10-CM | POA: Diagnosis not present

## 2014-08-17 DIAGNOSIS — B961 Klebsiella pneumoniae [K. pneumoniae] as the cause of diseases classified elsewhere: Secondary | ICD-10-CM | POA: Diagnosis not present

## 2014-08-17 DIAGNOSIS — K224 Dyskinesia of esophagus: Secondary | ICD-10-CM | POA: Diagnosis not present

## 2014-08-17 DIAGNOSIS — I10 Essential (primary) hypertension: Secondary | ICD-10-CM | POA: Diagnosis not present

## 2014-08-17 DIAGNOSIS — N390 Urinary tract infection, site not specified: Secondary | ICD-10-CM | POA: Diagnosis not present

## 2014-08-17 DIAGNOSIS — R269 Unspecified abnormalities of gait and mobility: Secondary | ICD-10-CM | POA: Diagnosis not present

## 2014-08-18 ENCOUNTER — Encounter: Payer: Medicare Other | Admitting: Cardiology

## 2014-09-05 DIAGNOSIS — F419 Anxiety disorder, unspecified: Secondary | ICD-10-CM | POA: Diagnosis not present

## 2014-09-05 DIAGNOSIS — K224 Dyskinesia of esophagus: Secondary | ICD-10-CM | POA: Diagnosis not present

## 2014-09-05 DIAGNOSIS — N390 Urinary tract infection, site not specified: Secondary | ICD-10-CM | POA: Diagnosis not present

## 2014-09-05 DIAGNOSIS — I10 Essential (primary) hypertension: Secondary | ICD-10-CM | POA: Diagnosis not present

## 2014-09-05 DIAGNOSIS — R269 Unspecified abnormalities of gait and mobility: Secondary | ICD-10-CM | POA: Diagnosis not present

## 2014-09-05 DIAGNOSIS — B961 Klebsiella pneumoniae [K. pneumoniae] as the cause of diseases classified elsewhere: Secondary | ICD-10-CM | POA: Diagnosis not present

## 2014-11-29 DIAGNOSIS — Z0000 Encounter for general adult medical examination without abnormal findings: Secondary | ICD-10-CM | POA: Diagnosis not present

## 2014-11-29 DIAGNOSIS — Z1389 Encounter for screening for other disorder: Secondary | ICD-10-CM | POA: Diagnosis not present

## 2014-11-29 DIAGNOSIS — K5900 Constipation, unspecified: Secondary | ICD-10-CM | POA: Diagnosis not present

## 2014-11-29 DIAGNOSIS — I10 Essential (primary) hypertension: Secondary | ICD-10-CM | POA: Diagnosis not present

## 2014-11-29 DIAGNOSIS — M810 Age-related osteoporosis without current pathological fracture: Secondary | ICD-10-CM | POA: Diagnosis not present

## 2014-11-29 DIAGNOSIS — E780 Pure hypercholesterolemia: Secondary | ICD-10-CM | POA: Diagnosis not present

## 2014-11-29 DIAGNOSIS — G4700 Insomnia, unspecified: Secondary | ICD-10-CM | POA: Diagnosis not present

## 2014-11-29 DIAGNOSIS — Z79899 Other long term (current) drug therapy: Secondary | ICD-10-CM | POA: Diagnosis not present

## 2015-01-12 DIAGNOSIS — Z23 Encounter for immunization: Secondary | ICD-10-CM | POA: Diagnosis not present

## 2015-04-19 DIAGNOSIS — R05 Cough: Secondary | ICD-10-CM | POA: Diagnosis not present

## 2015-05-29 DIAGNOSIS — K5900 Constipation, unspecified: Secondary | ICD-10-CM | POA: Diagnosis not present

## 2015-05-29 DIAGNOSIS — I10 Essential (primary) hypertension: Secondary | ICD-10-CM | POA: Diagnosis not present

## 2015-10-30 DIAGNOSIS — L600 Ingrowing nail: Secondary | ICD-10-CM | POA: Diagnosis not present

## 2015-10-30 DIAGNOSIS — M2570 Osteophyte, unspecified joint: Secondary | ICD-10-CM | POA: Diagnosis not present

## 2015-10-30 DIAGNOSIS — M79674 Pain in right toe(s): Secondary | ICD-10-CM | POA: Diagnosis not present

## 2015-11-16 DIAGNOSIS — M542 Cervicalgia: Secondary | ICD-10-CM | POA: Diagnosis not present

## 2015-11-16 DIAGNOSIS — M5441 Lumbago with sciatica, right side: Secondary | ICD-10-CM | POA: Diagnosis not present

## 2015-11-16 DIAGNOSIS — G8929 Other chronic pain: Secondary | ICD-10-CM | POA: Diagnosis not present

## 2015-11-22 DIAGNOSIS — M25551 Pain in right hip: Secondary | ICD-10-CM | POA: Diagnosis not present

## 2015-11-22 DIAGNOSIS — M545 Low back pain: Secondary | ICD-10-CM | POA: Diagnosis not present

## 2015-12-24 DIAGNOSIS — M25551 Pain in right hip: Secondary | ICD-10-CM | POA: Diagnosis not present

## 2015-12-24 DIAGNOSIS — M545 Low back pain: Secondary | ICD-10-CM | POA: Diagnosis not present

## 2015-12-31 DIAGNOSIS — K219 Gastro-esophageal reflux disease without esophagitis: Secondary | ICD-10-CM | POA: Diagnosis not present

## 2015-12-31 DIAGNOSIS — K5900 Constipation, unspecified: Secondary | ICD-10-CM | POA: Diagnosis not present

## 2015-12-31 DIAGNOSIS — Z23 Encounter for immunization: Secondary | ICD-10-CM | POA: Diagnosis not present

## 2016-01-10 ENCOUNTER — Other Ambulatory Visit: Payer: Self-pay | Admitting: Gastroenterology

## 2016-01-10 DIAGNOSIS — R1013 Epigastric pain: Secondary | ICD-10-CM

## 2016-01-10 DIAGNOSIS — K5900 Constipation, unspecified: Secondary | ICD-10-CM

## 2016-01-10 DIAGNOSIS — R1032 Left lower quadrant pain: Secondary | ICD-10-CM

## 2016-01-14 ENCOUNTER — Other Ambulatory Visit: Payer: Medicare Other

## 2016-01-15 ENCOUNTER — Ambulatory Visit
Admission: RE | Admit: 2016-01-15 | Discharge: 2016-01-15 | Disposition: A | Discharge status: Home / Self Care | Payer: Medicare Other | Source: Ambulatory Visit | Attending: Gastroenterology | Admitting: Gastroenterology

## 2016-01-15 DIAGNOSIS — R1032 Left lower quadrant pain: Secondary | ICD-10-CM

## 2016-01-15 DIAGNOSIS — K5900 Constipation, unspecified: Secondary | ICD-10-CM

## 2016-01-15 DIAGNOSIS — R1013 Epigastric pain: Secondary | ICD-10-CM

## 2016-01-15 DIAGNOSIS — N281 Cyst of kidney, acquired: Secondary | ICD-10-CM | POA: Diagnosis not present

## 2016-01-15 MED ORDER — IOPAMIDOL (ISOVUE-300) INJECTION 61%
100.0000 mL | Freq: Once | INTRAVENOUS | Status: AC | PRN
  Administered 2016-01-15: 100 mL via INTRAVENOUS

## 2016-02-06 DIAGNOSIS — K219 Gastro-esophageal reflux disease without esophagitis: Secondary | ICD-10-CM | POA: Diagnosis not present

## 2016-02-06 DIAGNOSIS — E7800 Pure hypercholesterolemia, unspecified: Secondary | ICD-10-CM | POA: Diagnosis not present

## 2016-02-06 DIAGNOSIS — R1084 Generalized abdominal pain: Secondary | ICD-10-CM | POA: Diagnosis not present

## 2016-02-06 DIAGNOSIS — Z1389 Encounter for screening for other disorder: Secondary | ICD-10-CM | POA: Diagnosis not present

## 2016-02-06 DIAGNOSIS — Z0000 Encounter for general adult medical examination without abnormal findings: Secondary | ICD-10-CM | POA: Diagnosis not present

## 2016-02-06 DIAGNOSIS — I10 Essential (primary) hypertension: Secondary | ICD-10-CM | POA: Diagnosis not present

## 2016-02-06 DIAGNOSIS — R110 Nausea: Secondary | ICD-10-CM | POA: Diagnosis not present

## 2016-03-06 DIAGNOSIS — I10 Essential (primary) hypertension: Secondary | ICD-10-CM | POA: Diagnosis not present

## 2016-03-06 DIAGNOSIS — R109 Unspecified abdominal pain: Secondary | ICD-10-CM | POA: Diagnosis not present

## 2016-03-06 DIAGNOSIS — F419 Anxiety disorder, unspecified: Secondary | ICD-10-CM | POA: Diagnosis not present

## 2016-08-06 DIAGNOSIS — M25562 Pain in left knee: Secondary | ICD-10-CM | POA: Diagnosis not present

## 2016-08-06 DIAGNOSIS — M545 Low back pain: Secondary | ICD-10-CM | POA: Diagnosis not present

## 2016-08-06 DIAGNOSIS — G8929 Other chronic pain: Secondary | ICD-10-CM | POA: Diagnosis not present

## 2016-08-21 DIAGNOSIS — M25562 Pain in left knee: Secondary | ICD-10-CM | POA: Diagnosis not present

## 2016-08-21 DIAGNOSIS — M545 Low back pain: Secondary | ICD-10-CM | POA: Diagnosis not present

## 2016-08-21 DIAGNOSIS — G8929 Other chronic pain: Secondary | ICD-10-CM | POA: Diagnosis not present

## 2016-08-22 ENCOUNTER — Other Ambulatory Visit: Payer: Self-pay | Admitting: Geriatric Medicine

## 2016-08-22 DIAGNOSIS — R1084 Generalized abdominal pain: Secondary | ICD-10-CM

## 2016-08-22 DIAGNOSIS — I10 Essential (primary) hypertension: Secondary | ICD-10-CM | POA: Diagnosis not present

## 2016-08-22 DIAGNOSIS — R110 Nausea: Secondary | ICD-10-CM | POA: Diagnosis not present

## 2016-08-28 ENCOUNTER — Other Ambulatory Visit: Payer: Medicare Other

## 2016-08-29 ENCOUNTER — Other Ambulatory Visit: Payer: Medicare Other

## 2016-08-29 ENCOUNTER — Ambulatory Visit
Admission: RE | Admit: 2016-08-29 | Discharge: 2016-08-29 | Disposition: A | Discharge status: Home / Self Care | Payer: Medicare Other | Source: Ambulatory Visit | Attending: Geriatric Medicine | Admitting: Geriatric Medicine

## 2016-08-29 DIAGNOSIS — R109 Unspecified abdominal pain: Secondary | ICD-10-CM | POA: Diagnosis not present

## 2016-08-29 DIAGNOSIS — R1084 Generalized abdominal pain: Secondary | ICD-10-CM

## 2016-08-29 MED ORDER — IOPAMIDOL (ISOVUE-300) INJECTION 61%
100.0000 mL | Freq: Once | INTRAVENOUS | Status: AC | PRN
  Administered 2016-08-29: 100 mL via INTRAVENOUS

## 2016-12-29 DIAGNOSIS — Z23 Encounter for immunization: Secondary | ICD-10-CM | POA: Diagnosis not present

## 2017-02-06 DIAGNOSIS — I10 Essential (primary) hypertension: Secondary | ICD-10-CM | POA: Diagnosis not present

## 2017-02-06 DIAGNOSIS — E7800 Pure hypercholesterolemia, unspecified: Secondary | ICD-10-CM | POA: Diagnosis not present

## 2017-02-06 DIAGNOSIS — N3000 Acute cystitis without hematuria: Secondary | ICD-10-CM | POA: Diagnosis not present

## 2017-02-06 DIAGNOSIS — R1013 Epigastric pain: Secondary | ICD-10-CM | POA: Diagnosis not present

## 2017-02-06 DIAGNOSIS — R410 Disorientation, unspecified: Secondary | ICD-10-CM | POA: Diagnosis not present

## 2017-04-21 DIAGNOSIS — M810 Age-related osteoporosis without current pathological fracture: Secondary | ICD-10-CM | POA: Diagnosis not present

## 2017-04-21 DIAGNOSIS — K219 Gastro-esophageal reflux disease without esophagitis: Secondary | ICD-10-CM | POA: Diagnosis not present

## 2017-04-21 DIAGNOSIS — Z1389 Encounter for screening for other disorder: Secondary | ICD-10-CM | POA: Diagnosis not present

## 2017-04-21 DIAGNOSIS — I10 Essential (primary) hypertension: Secondary | ICD-10-CM | POA: Diagnosis not present

## 2017-04-21 DIAGNOSIS — Z0000 Encounter for general adult medical examination without abnormal findings: Secondary | ICD-10-CM | POA: Diagnosis not present

## 2017-09-09 DIAGNOSIS — I10 Essential (primary) hypertension: Secondary | ICD-10-CM | POA: Diagnosis not present

## 2017-09-09 DIAGNOSIS — R05 Cough: Secondary | ICD-10-CM | POA: Diagnosis not present

## 2017-09-09 DIAGNOSIS — R51 Headache: Secondary | ICD-10-CM | POA: Diagnosis not present

## 2018-01-05 DIAGNOSIS — Z23 Encounter for immunization: Secondary | ICD-10-CM | POA: Diagnosis not present

## 2018-04-27 DIAGNOSIS — Z0000 Encounter for general adult medical examination without abnormal findings: Secondary | ICD-10-CM | POA: Diagnosis not present

## 2018-04-27 DIAGNOSIS — E7800 Pure hypercholesterolemia, unspecified: Secondary | ICD-10-CM | POA: Diagnosis not present

## 2018-04-27 DIAGNOSIS — M810 Age-related osteoporosis without current pathological fracture: Secondary | ICD-10-CM | POA: Diagnosis not present

## 2018-04-27 DIAGNOSIS — K219 Gastro-esophageal reflux disease without esophagitis: Secondary | ICD-10-CM | POA: Diagnosis not present

## 2018-04-27 DIAGNOSIS — Z79899 Other long term (current) drug therapy: Secondary | ICD-10-CM | POA: Diagnosis not present

## 2018-04-27 DIAGNOSIS — Z1389 Encounter for screening for other disorder: Secondary | ICD-10-CM | POA: Diagnosis not present

## 2018-04-27 DIAGNOSIS — I10 Essential (primary) hypertension: Secondary | ICD-10-CM | POA: Diagnosis not present

## 2018-05-13 ENCOUNTER — Emergency Department (HOSPITAL_COMMUNITY): Payer: Medicare Other

## 2018-05-13 ENCOUNTER — Emergency Department (HOSPITAL_COMMUNITY)
Admission: EM | Admit: 2018-05-13 | Discharge: 2018-05-14 | Disposition: A | Discharge status: Home / Self Care | Payer: Medicare Other | Attending: Emergency Medicine | Admitting: Emergency Medicine

## 2018-05-13 ENCOUNTER — Encounter (HOSPITAL_COMMUNITY): Payer: Self-pay | Admitting: Emergency Medicine

## 2018-05-13 DIAGNOSIS — Y999 Unspecified external cause status: Secondary | ICD-10-CM | POA: Diagnosis not present

## 2018-05-13 DIAGNOSIS — Y93E3 Activity, vacuuming: Secondary | ICD-10-CM | POA: Insufficient documentation

## 2018-05-13 DIAGNOSIS — Z96649 Presence of unspecified artificial hip joint: Secondary | ICD-10-CM | POA: Diagnosis not present

## 2018-05-13 DIAGNOSIS — S3289XA Fracture of other parts of pelvis, initial encounter for closed fracture: Secondary | ICD-10-CM

## 2018-05-13 DIAGNOSIS — I10 Essential (primary) hypertension: Secondary | ICD-10-CM | POA: Insufficient documentation

## 2018-05-13 DIAGNOSIS — W01190A Fall on same level from slipping, tripping and stumbling with subsequent striking against furniture, initial encounter: Secondary | ICD-10-CM | POA: Diagnosis not present

## 2018-05-13 DIAGNOSIS — S59902A Unspecified injury of left elbow, initial encounter: Secondary | ICD-10-CM | POA: Diagnosis not present

## 2018-05-13 DIAGNOSIS — M25522 Pain in left elbow: Secondary | ICD-10-CM | POA: Diagnosis not present

## 2018-05-13 DIAGNOSIS — Y92009 Unspecified place in unspecified non-institutional (private) residence as the place of occurrence of the external cause: Secondary | ICD-10-CM | POA: Diagnosis not present

## 2018-05-13 DIAGNOSIS — S51812A Laceration without foreign body of left forearm, initial encounter: Secondary | ICD-10-CM | POA: Diagnosis not present

## 2018-05-13 DIAGNOSIS — S32501A Unspecified fracture of right pubis, initial encounter for closed fracture: Secondary | ICD-10-CM | POA: Diagnosis not present

## 2018-05-13 DIAGNOSIS — S32591A Other specified fracture of right pubis, initial encounter for closed fracture: Secondary | ICD-10-CM | POA: Diagnosis not present

## 2018-05-13 MED ORDER — ONDANSETRON 4 MG PO TBDP
4.0000 mg | ORAL_TABLET | Freq: Once | ORAL | Status: AC
  Administered 2018-05-13: 4 mg via ORAL
  Filled 2018-05-13: qty 1

## 2018-05-13 MED ORDER — OXYCODONE-ACETAMINOPHEN 5-325 MG PO TABS
1.0000 | ORAL_TABLET | Freq: Once | ORAL | Status: AC
  Administered 2018-05-13: 1 via ORAL
  Filled 2018-05-13: qty 1

## 2018-05-13 MED ORDER — FENTANYL CITRATE (PF) 100 MCG/2ML IJ SOLN: 25.0000 ug | Freq: Once | INTRAMUSCULAR | Status: DC

## 2018-05-13 MED ORDER — LIDOCAINE-EPINEPHRINE (PF) 2 %-1:200000 IJ SOLN
10.0000 mL | Freq: Once | INTRAMUSCULAR | Status: AC
  Administered 2018-05-14: 10 mL
  Filled 2018-05-13: qty 20

## 2018-05-13 NOTE — ED Notes (Signed)
Pt given saltine crackers and chicken noodle soup

## 2018-05-13 NOTE — ED Triage Notes (Signed)
Patient here from home with complaints of fall today. Reports right arm pain and laceration. Bandage, bleeding controlled. Also reports right hip pain.

## 2018-05-14 MED ORDER — ONDANSETRON 4 MG PO TBDP: 4.0000 mg | ORAL_TABLET | ORAL | 0 refills | Status: DC | PRN

## 2018-05-14 MED ORDER — OXYCODONE-ACETAMINOPHEN 5-325 MG PO TABS: 1.0000 | ORAL_TABLET | Freq: Four times a day (QID) | ORAL | 0 refills | Status: DC | PRN

## 2018-05-14 NOTE — ED Provider Notes (Signed)
[facility] DEPT Provider Note   CSN: [id number] Arrival date & time: 05/13/18  1700     History   Chief Complaint Chief Complaint  Patient presents with  . Fall  . Hip Pain  . Arm Pain    HPI [name] [name] is a 85 y.o. female.  HPI Patient reports that she was at home vacuuming when she got tripped up in the cord.  This caused her to fall and strike her arm on a wooden chair handle creating a large laceration on the left forearm.  She reports it bled a lot but she poured alcohol on it and put a dressing on it.  She had fallen onto her buttocks and had a lot of pain in the right hip.  She was able to ambulate after this occurred.  She and her neighbor went to urgent care.  At urgent care it was determined that her arm laceration was beyond the scope of their management and she was referred to the emergency department.  Patient denies that she struck her head.  She denies loss of consciousness.  No confusion no neck pain.  No chest pain or difficulty breathing.  No weakness numbness tingling of extremities. Past Medical History:  Diagnosis Date  . Arthritis   . Hip fracture [facility]) 2009   surgery  . History of depression   . History of gallstones   . History of IBS   . History of vertigo   . Hyperlipidemia   . Hypertension   . RBBB (right bundle branch block)     Patient Active Problem List   Diagnosis Date Noted  . Esophageal dysmotility 07/18/2014  . Anxiety state 07/10/2014  . Headache 07/07/2014  . Intractable vomiting with nausea 07/07/2014  . Dehydration 07/07/2014  . UTI (urinary tract infection) 07/07/2014  . History of depression   . History of gallstones   . Hip fracture ([place])   . Hip fracture ([place])   . History of vertigo   . Arthritis   . RBBB (right bundle branch block)   . History of IBS     Past Surgical History:  Procedure Laterality Date  . CHOLECYSTECTOMY  2008   Dr. Harlow Asa  . [place]   hysterectomy  . TONSILECTOMY, ADENOIDECTOMY, BILATERAL MYRINGOTOMY AND TUBES     age 61 tonsils and adnoids only  . TOTAL HIP ARTHROPLASTY  02/20/2008   fractured hip     OB History   No obstetric history on file.      Home Medications    Prior to Admission medications   Medication Sig Start Date End Date Taking? Authorizing Provider  acetaminophen (TYLENOL) 500 MG tablet Take 500 mg by mouth every 6 (six) hours as needed for moderate pain.   Yes [provider]  ALPRAZolam (XANAX) 0.25 MG tablet Take 1 tablet (0.25 mg total) by mouth daily as needed for anxiety. Patient not taking: Reported on 05/13/2018 07/10/14   Rai, Vernelle Emerald, MD  diltiazem (CARDIZEM CD) 120 MG 24 hr capsule Take 1 capsule (120 mg total) by mouth daily. Patient not taking: Reported on 05/13/2018 07/10/14   Rai, Vernelle Emerald, MD  esomeprazole (NEXIUM) 40 MG capsule Take 1 capsule (40 mg total) by mouth 2 (two) times daily before a meal. Patient not taking: Reported on 05/13/2018 07/10/14   Rai, Vernelle Emerald, MD  metoCLOPramide (REGLAN) 5 MG tablet Take 1 tablet (5 mg total) by mouth 3 (three) times daily with  meals as needed for nausea (take 63mins before meals). Patient not taking: Reported on 05/13/2018 07/10/14   Rai, Vernelle Emerald, MD  ondansetron (ZOFRAN ODT) 4 MG disintegrating tablet Take 1 tablet (4 mg total) by mouth every 4 (four) hours as needed for nausea or vomiting. 05/14/18   Charlesetta Shanks, MD  oxyCODONE-acetaminophen (PERCOCET) 5-325 MG tablet Take 1-2 tablets by mouth every 6 (six) hours as needed. 05/14/18   Charlesetta Shanks, MD  promethazine (PHENERGAN) 12.5 MG tablet Take 1 tablet (12.5 mg total) by mouth every 6 (six) hours as needed for nausea or vomiting. Patient not taking: Reported on 05/13/2018 07/10/14   Rai, Vernelle Emerald, MD  traMADol (ULTRAM) 50 MG tablet Take 1 tablet (50 mg total) by mouth every 8 (eight) hours as needed for moderate pain (headache). Patient not taking: Reported on 05/13/2018  07/10/14   Mendel Corning, MD    Family History Family History  Problem Relation Age of Onset  . Tuberculosis Mother        very young age  . Other Father        suicide  . Colon cancer Sister        colon  . Kidney disease Brother   . Heart disease Brother   . Colon polyps Neg Hx   . Diabetes Neg Hx   . Esophageal cancer Neg Hx     Social History Social History   Tobacco Use  . Smoking status: Never Smoker  . Smokeless tobacco: Never Used  Substance Use Topics  . Alcohol use: No    Alcohol/week: 0.0 standard drinks  . Drug use: No     Allergies   Codeine; Crestor [rosuvastatin calcium]; Escitalopram oxalate; and Ezetimibe-simvastatin   Review of Systems Review of Systems 10 Systems reviewed and are negative for acute change except as noted in the HPI.   Physical Exam Updated Vital Signs BP (!) 145/110 (BP Location: Right Arm)   Pulse 77   Temp 98.7 F (37.1 C) (Oral)   Resp 16   Ht 5' (1.524 m)   Wt 45.4 kg   SpO2 96%   BMI 19.53 kg/m   Physical Exam Constitutional:      Appearance: Normal appearance.     Comments: Patient is alert and in excellent physical condition.  Mental status clear.  No respiratory distress.  HENT:     Head: Normocephalic and atraumatic.     Nose: Nose normal.     Mouth/Throat:     Mouth: Mucous membranes are moist.  Eyes:     Extraocular Movements: Extraocular movements intact.     Pupils: Pupils are equal, round, and reactive to light.  Neck:     Musculoskeletal: Neck supple.  Cardiovascular:     Rate and Rhythm: Normal rate and regular rhythm.  Pulmonary:     Effort: Pulmonary effort is normal.     Breath sounds: Normal breath sounds.  Abdominal:     General: There is no distension.     Palpations: Abdomen is soft.     Tenderness: There is no abdominal tenderness. There is no guarding.  Musculoskeletal:     Comments: Patient has a flap laceration to the left forearm on the dorsal surface.  Some slow venous  bleeding when dressing removed.  This resolved with direct pressure.  No deformities of the bony structures.  Normal range of motion.  Patient has tenderness over the right trochanter.  She has intact range of motion.  Distal pulses 2+.  Skin:    General: Skin is warm and dry.  Neurological:     General: No focal deficit present.     Mental Status: She is alert and oriented to person, place, and time.     Coordination: Coordination normal.  Psychiatric:        Mood and Affect: Mood normal.      ED Treatments / Results  Labs (all labs ordered are listed, but only abnormal results are displayed) Labs Reviewed - No data to display  EKG None  Radiology Dg Elbow Complete Left  Result Date: 05/13/2018 CLINICAL DATA:  Patient here from home with complaints of fall today. Pt states she has left arm/eblow pain since fall. Pt states she also reports right hip pain. EXAM: LEFT ELBOW - COMPLETE 3+ VIEW COMPARISON:  None. FINDINGS: There is no evidence of fracture, dislocation, or joint effusion. There is no evidence of arthropathy or other focal bone abnormality. Soft tissues are unremarkable. IMPRESSION: Negative. Electronically Signed   By: Nolon Nations M.D.   On: 05/13/2018 18:41   Ct Hip Right Wo Contrast  Result Date: 05/13/2018 CLINICAL DATA:  Difficulty weight-bearing after fall today. Right hip pain. EXAM: CT OF THE RIGHT HIP WITHOUT CONTRAST TECHNIQUE: Multidetector CT imaging of the right hip was performed according to the standard protocol. Multiplanar CT image reconstructions were also generated. COMPARISON:  Same day hip radiographs FINDINGS: Bones/Joint/Cartilage Acute slightly comminuted fracture of the right mid inferior pubic ramus. Additional fracture completing the obturator ring is not identified. Intact partially included right femoral nail fixation. 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 No pelvic diastasis is seen of the included right SI joint and pubic symphysis.  Ligaments Suboptimally assessed by CT. Muscles and Tendons Negative for atrophy. No intramuscular hematoma or. Soft tissues Right adnexal cyst without worrisome features measuring 2.8 cm in diameter IMPRESSION: 1. Acute slightly comminuted fracture of the right mid inferior pubic ramus. 2. Right femoral nail fixation of the proximal right femur without complicating features. 3. Right adnexal cyst measuring 2.8 cm in diameter without worrisome features. This could be further imaged with ultrasound non emergently. Electronically Signed   By: Ashley Royalty M.D.   On: 05/13/2018 22:01   Dg Hip Unilat  With Pelvis 2-3 Views Right  Result Date: 05/13/2018 CLINICAL DATA:  Patient here from home with complaints of fall today. Pt states she has left arm/eblow pain since fall. Pt states she also reports right hip pain. EXAM: DG HIP (WITH OR WITHOUT PELVIS) 2-3V RIGHT COMPARISON:  CT of the abdomen and pelvis on 08/29/2016 FINDINGS: There is an acute minimally displaced fracture of the RIGHT inferior pubic ramus. Remote ORIF of the RIGHT hip. Degenerative changes are seen in both hips. There is degenerative change in the LOWER lumbar spine. Significant stool burden. IMPRESSION: Acute fracture of the RIGHT inferior pubic ramus. Electronically Signed   By: Nolon Nations M.D.   On: 05/13/2018 18:44    Procedures .Marland KitchenLaceration Repair Date/Time: 05/14/2018 12:13 AM Performed by: Charlesetta Shanks, MD Authorized by: Charlesetta Shanks, MD   Consent:    Consent obtained:  Verbal   Consent given by:  Patient   Risks discussed:  Infection, pain, poor cosmetic result, need for additional repair and poor wound healing   Alternatives discussed:  Delayed treatment Anesthesia (see MAR for exact dosages):    Anesthesia method:  Local infiltration   Local anesthetic:  Lidocaine 2% WITH epi Laceration details:    Location:  Shoulder/arm   Shoulder/arm  location:  L lower arm   Length (cm):  15   Depth (mm):  4 Pre-procedure  details:    Preparation:  Patient was prepped and draped in usual sterile fashion Exploration:    Wound exploration: wound explored through full range of motion     Wound extent: areolar tissue violated     Contaminated: no   Treatment:    Area cleansed with:  Saline and Hibiclens   Amount of cleaning:  Extensive   Irrigation solution:  Sterile saline Skin repair:    Repair method:  Sutures   Suture size:  4-0   Suture technique:  Simple interrupted   Number of sutures:  7 Approximation:    Approximation:  Loose Post-procedure details:    Dressing:  Sterile dressing   Patient tolerance of procedure:  Tolerated well, no immediate complications Comments:     Patient with complex large flap skin tear.  Wound was cleaned well with sterile saline.  Anesthesia with lidocaine with epinephrine.  Patient had very thin friable skin that the glove and a flap pattern.  Subcutaneous tissue and vessels visible but no muscle tears.  With cleaning and anesthesia I was able to realign the flap and with 7 anchor sutures in place, apply Steri-Strips for good approximation and coverage.  Subsequently Xeroform dressing and 4 x 4 gauze applied and stabilized with gauze wrap.   (including critical care time)  Medications Ordered in ED Medications  oxyCODONE-acetaminophen (PERCOCET/ROXICET) 5-325 MG per tablet 1 tablet (1 tablet Oral Given 05/13/18 2214)  ondansetron (ZOFRAN-ODT) disintegrating tablet 4 mg (4 mg Oral Given 05/13/18 2214)  lidocaine-EPINEPHrine (XYLOCAINE W/EPI) 2 %-1:200000 (PF) injection 10 mL (10 mLs Infiltration Given by Other 05/14/18 0013)     Initial Impression / Assessment and Plan / ED Course  I have reviewed the triage vital signs and the nursing notes.  Pertinent labs & imaging results that were available during my care of the patient were reviewed by me and considered in my medical decision making (see chart for details).     Patient has a pelvic ring fracture.  CT scan  obtained to rule out any occult hip fracture as patient has some discomfort over the greater trochanter.  None present.  Patient has been ambulatory and has no neurologic symptoms.  Patient had large flap laceration left forearm repaired as outlined.  Patient is excellent condition for age.  Her neighbor will stay with her this evening.  Return precautions reviewed.  Final Clinical Impressions(s) / ED Diagnoses   Final diagnoses:  Laceration of left forearm, initial encounter  Closed fracture of other parts of pelvis, initial encounter [facility])    ED Discharge Orders         Ordered    oxyCODONE-acetaminophen (PERCOCET) 5-325 MG tablet  Every 6 hours PRN     05/14/18 0010    ondansetron (ZOFRAN ODT) 4 MG disintegrating tablet  Every 4 hours PRN     05/14/18 0010           Charlesetta Shanks, MD 05/14/18 [id number]

## 2018-05-14 NOTE — Discharge Instructions (Addendum)
1.  Elevate your arm on a pillow and apply a well wrapped cool pack.  Do not allow your dressing to get wet.  Keep your dressing in place until you follow-up with your doctor in about 3 to 4 days.  Your doctor can change the outer layers of the dressing.  The Steri-Strips should stay in place until they fall off by themselves.  The outer antibiotic gauze can be changed at your doctor's office.  If you start developing worsening pain, swelling or redness return for recheck of the arm. 2.  You may take plain acetaminophen for pain control for your pelvic fracture.  If you need stronger medication you may take 1-2 Percocet every 6 hours.  Take Zofran if needed for nausea.  You may walk using a walker for support. 3.  Return to the emergency department with any concerns or worsening symptoms.

## 2018-05-17 DIAGNOSIS — S32591A Other specified fracture of right pubis, initial encounter for closed fracture: Secondary | ICD-10-CM | POA: Diagnosis not present

## 2018-05-17 DIAGNOSIS — I10 Essential (primary) hypertension: Secondary | ICD-10-CM | POA: Diagnosis not present

## 2018-05-17 DIAGNOSIS — S51812A Laceration without foreign body of left forearm, initial encounter: Secondary | ICD-10-CM | POA: Diagnosis not present

## 2018-05-24 DIAGNOSIS — S51812D Laceration without foreign body of left forearm, subsequent encounter: Secondary | ICD-10-CM | POA: Diagnosis not present

## 2018-05-24 DIAGNOSIS — S32591D Other specified fracture of right pubis, subsequent encounter for fracture with routine healing: Secondary | ICD-10-CM | POA: Diagnosis not present

## 2018-10-20 DIAGNOSIS — G4489 Other headache syndrome: Secondary | ICD-10-CM | POA: Diagnosis not present

## 2018-10-20 DIAGNOSIS — I10 Essential (primary) hypertension: Secondary | ICD-10-CM | POA: Diagnosis not present

## 2018-10-20 DIAGNOSIS — K219 Gastro-esophageal reflux disease without esophagitis: Secondary | ICD-10-CM | POA: Diagnosis not present

## 2018-11-11 DIAGNOSIS — I10 Essential (primary) hypertension: Secondary | ICD-10-CM | POA: Diagnosis not present

## 2018-11-11 DIAGNOSIS — S61412A Laceration without foreign body of left hand, initial encounter: Secondary | ICD-10-CM | POA: Diagnosis not present

## 2018-12-23 DIAGNOSIS — Z23 Encounter for immunization: Secondary | ICD-10-CM | POA: Diagnosis not present

## 2019-01-05 ENCOUNTER — Other Ambulatory Visit: Payer: Self-pay

## 2019-01-05 DIAGNOSIS — Z20822 Contact with and (suspected) exposure to covid-19: Secondary | ICD-10-CM

## 2019-01-07 ENCOUNTER — Telehealth: Payer: Self-pay | Admitting: Geriatric Medicine

## 2019-01-07 LAB — NOVEL CORONAVIRUS, NAA: SARS-CoV-2, NAA: NOT DETECTED

## 2019-01-07 NOTE — Telephone Encounter (Signed)
Pt called to get COVID results, made her aware they are negative. °

## 2019-03-17 ENCOUNTER — Ambulatory Visit (HOSPITAL_COMMUNITY)
Admission: EM | Admit: 2019-03-17 | Discharge: 2019-03-17 | Disposition: A | Discharge status: Home / Self Care | Payer: Medicare Other | Attending: Internal Medicine | Admitting: Internal Medicine

## 2019-03-17 ENCOUNTER — Encounter (HOSPITAL_COMMUNITY): Payer: Self-pay

## 2019-03-17 ENCOUNTER — Other Ambulatory Visit: Payer: Self-pay

## 2019-03-17 DIAGNOSIS — I10 Essential (primary) hypertension: Secondary | ICD-10-CM

## 2019-03-17 DIAGNOSIS — Z20822 Contact with and (suspected) exposure to covid-19: Secondary | ICD-10-CM

## 2019-03-17 DIAGNOSIS — Z20828 Contact with and (suspected) exposure to other viral communicable diseases: Secondary | ICD-10-CM | POA: Diagnosis not present

## 2019-03-17 NOTE — Discharge Instructions (Signed)
Rapid COVID negative, COVID PCR pending- return in approximately 2 days  Your blood pressure was elevated today in clinic. Please be sure to take blood pressure medications as prescribed. Please monitor your blood pressure at home or when you go to a [facility]. Please follow up with your primary care doctor to recheck blood pressure and discuss any need for medication changes.   Please go to Emergency Room if you start to experience severe headache, vision changes, decreased urine production, chest pain, shortness of breath, speech slurring, one sided weakness.

## 2019-03-17 NOTE — ED Notes (Signed)
Vital sign reported to Provider H. Wieters

## 2019-03-17 NOTE — ED Provider Notes (Signed)
[place]    CSN: [id number] Arrival date & time: 03/17/19  1357      History   Chief Complaint Chief Complaint  Patient presents with  . COVID test    HPI [name] [name] is a 85 y.o. female history of hypertension, arthritis, presenting today for Covid testing.  Patient states that her daughter-in-law recently tested positive for Covid.  She was around her on Saturday, approximately 5 days ago.  Patient herself has not developed any symptoms of cough, congestion or sore throat.  Denies fevers chills or body aches.  She has maintained normal eating and drinking and normal energy level.  Patient states that she has not taken her blood pressure medicine today.  States that it typically runs elevated.  She denies any headaches, vision changes.  Denies chest pain or shortness of breath.  Denies difficulty speaking.  She has felt normal and at her baseline.  She also states that it is not abnormal for her heart rate to be elevated at doctor's visits.  She is anxious about the result as well as amount of time spent waiting for her visit today.  HPI  Past Medical History:  Diagnosis Date  . Arthritis   . Hip fracture [facility]) 2009   surgery  . History of depression   . History of gallstones   . History of IBS   . History of vertigo   . Hyperlipidemia   . Hypertension   . RBBB (right bundle branch block)     Patient Active Problem List   Diagnosis Date Noted  . Esophageal dysmotility 07/18/2014  . Anxiety state 07/10/2014  . Headache 07/07/2014  . Intractable vomiting with nausea 07/07/2014  . Dehydration 07/07/2014  . UTI (urinary tract infection) 07/07/2014  . History of depression   . History of gallstones   . Hip fracture ([place])   . Hip fracture ([place])   . History of vertigo   . Arthritis   . RBBB (right bundle branch block)   . History of IBS     Past Surgical History:  Procedure Laterality Date  . CHOLECYSTECTOMY  2008   Dr. Harlow Asa  . [place]   hysterectomy  . TONSILECTOMY, ADENOIDECTOMY, BILATERAL MYRINGOTOMY AND TUBES     age 57 tonsils and adnoids only  . TOTAL HIP ARTHROPLASTY  02/20/2008   fractured hip    OB History   No obstetric history on file.      Home Medications    Prior to Admission medications   Medication Sig Start Date End Date Taking? Authorizing Provider  acetaminophen (TYLENOL) 500 MG tablet Take 500 mg by mouth every 6 (six) hours as needed for moderate pain.    [provider]  diltiazem (CARDIZEM CD) 120 MG 24 hr capsule Take 1 capsule (120 mg total) by mouth daily. Patient not taking: Reported on 05/13/2018 07/10/14 03/17/19  Mendel Corning, MD  esomeprazole (NEXIUM) 40 MG capsule Take 1 capsule (40 mg total) by mouth 2 (two) times daily before a meal. Patient not taking: Reported on 05/13/2018 07/10/14 03/17/19  Rai, Vernelle Emerald, MD  metoCLOPramide (REGLAN) 5 MG tablet Take 1 tablet (5 mg total) by mouth 3 (three) times daily with meals as needed for nausea (take 4mins before meals). Patient not taking: Reported on 05/13/2018 07/10/14 03/17/19  Rai, Vernelle Emerald, MD  promethazine (PHENERGAN) 12.5 MG tablet Take 1 tablet (12.5 mg total) by mouth every 6 (six) hours as needed for  nausea or vomiting. Patient not taking: Reported on 05/13/2018 07/10/14 03/17/19  Mendel Corning, MD    Family History Family History  Problem Relation Age of Onset  . Tuberculosis Mother        very young age  . Other Father        suicide  . Colon cancer Sister        colon  . Kidney disease Brother   . Heart disease Brother   . Colon polyps Neg Hx   . Diabetes Neg Hx   . Esophageal cancer Neg Hx     Social History Social History   Tobacco Use  . Smoking status: Never Smoker  . Smokeless tobacco: Never Used  Substance Use Topics  . Alcohol use: No    Alcohol/week: 0.0 standard drinks  . Drug use: No     Allergies   Codeine, Crestor [rosuvastatin calcium], Escitalopram oxalate, and  Ezetimibe-simvastatin   Review of Systems Review of Systems  Constitutional: Negative for activity change, appetite change, chills, fatigue and fever.  HENT: Negative for congestion, ear pain, rhinorrhea, sinus pressure, sore throat and trouble swallowing.   Eyes: Negative for photophobia, pain, discharge, redness and visual disturbance.  Respiratory: Negative for cough, chest tightness and shortness of breath.   Cardiovascular: Negative for chest pain.  Gastrointestinal: Negative for abdominal pain, diarrhea, nausea and vomiting.  Genitourinary: Negative for decreased urine volume and hematuria.  Musculoskeletal: Negative for myalgias, neck pain and neck stiffness.  Skin: Negative for rash.  Neurological: Negative for dizziness, syncope, facial asymmetry, speech difficulty, weakness, light-headedness, numbness and headaches.     Physical Exam Triage Vital Signs ED Triage Vitals  Enc Vitals Group     BP 03/17/19 1449 (!) 186/98     Pulse Rate 03/17/19 1449 (!) 107     Resp 03/17/19 1449 14     Temp 03/17/19 1449 98.6 F (37 C)     Temp Source 03/17/19 1449 Oral     SpO2 03/17/19 1449 97 %     Weight --      Height --      Head Circumference --      Peak Flow --      Pain Score 03/17/19 1447 0     Pain Loc --      Pain Edu? --      Excl. in [place]? --    No data found.  Updated Vital Signs BP (!) 186/98 (BP Location: Right Arm)   Pulse (!) 107   Temp 98.6 F (37 C) (Oral)   Resp 14   SpO2 97%   Visual Acuity Right Eye Distance:   Left Eye Distance:   Bilateral Distance:    Right Eye Near:   Left Eye Near:    Bilateral Near:     Physical Exam Vitals and nursing note reviewed.  Constitutional:      Appearance: She is well-developed.     Comments: No acute distress  HENT:     Head: Normocephalic and atraumatic.     Ears:     Comments: Hard of hearing    Nose: Nose normal.  Eyes:     Extraocular Movements: Extraocular movements intact.      Conjunctiva/sclera: Conjunctivae normal.  Cardiovascular:     Rate and Rhythm: Tachycardia present.  Pulmonary:     Effort: Pulmonary effort is normal. No respiratory distress.     Comments: Breathing comfortably at rest, CTABL, no wheezing, rales or other adventitious sounds auscultated  Abdominal:     General: There is no distension.  Musculoskeletal:        General: Normal range of motion.     Cervical back: Neck supple.  Skin:    General: Skin is warm and dry.  Neurological:     Mental Status: She is alert and oriented to person, place, and time.      UC Treatments / Results  Labs (all labs ordered are listed, but only abnormal results are displayed) Labs Reviewed  NOVEL CORONAVIRUS, NAA (HOSP ORDER, SEND-OUT TO REF LAB; TAT 18-24 HRS)  POC SARS CORONAVIRUS 2 AG -  ED  POC SARS CORONAVIRUS 2 AG    EKG   Radiology No results found.  Procedures Procedures (including critical care time)  Medications Ordered in UC Medications - No data to display  Initial Impression / Assessment and Plan / UC Course  I have reviewed the triage vital signs and the nursing notes.  Pertinent labs & imaging results that were available during my care of the patient were reviewed by me and considered in my medical decision making (see chart for details).     Covid point-of-care negative, Covid PCR pending.  Currently asymptomatic, monitor for development of symptoms.  Discussed elevated blood pressure today, patient has not taken her medicine.  Denies red flag symptoms for hypertensive emergency.  Recommending taking blood pressure medicine once at home and continuing to monitor for development of any warning symptoms.  Discussed strict return precautions. Patient verbalized understanding and is agreeable with plan.  Final Clinical Impressions(s) / UC Diagnoses   Final diagnoses:  Exposure to COVID-19 virus  Encounter for laboratory testing for COVID-19 virus  Essential hypertension      Discharge Instructions     Rapid COVID negative, COVID PCR pending- return in approximately 2 days  Your blood pressure was elevated today in clinic. Please be sure to take blood pressure medications as prescribed. Please monitor your blood pressure at home or when you go to a [facility]. Please follow up with your primary care doctor to recheck blood pressure and discuss any need for medication changes.   Please go to Emergency Room if you start to experience severe headache, vision changes, decreased urine production, chest pain, shortness of breath, speech slurring, one sided weakness.     ED Prescriptions    None     PDMP not reviewed this encounter.   Janith Lima, PA-C 03/17/19 1527

## 2019-03-17 NOTE — ED Triage Notes (Signed)
Pt presents to the UC for COVID test. Pt reports she was exposed this pass weekend to a family member that tested positive for COVID.

## 2019-03-18 LAB — NOVEL CORONAVIRUS, NAA (HOSP ORDER, SEND-OUT TO REF LAB; TAT 18-24 HRS): SARS-CoV-2, NAA: NOT DETECTED

## 2019-04-27 ENCOUNTER — Ambulatory Visit: Payer: Medicare Other

## 2019-05-03 DIAGNOSIS — I10 Essential (primary) hypertension: Secondary | ICD-10-CM | POA: Diagnosis not present

## 2019-05-03 DIAGNOSIS — H8113 Benign paroxysmal vertigo, bilateral: Secondary | ICD-10-CM | POA: Diagnosis not present

## 2019-05-03 DIAGNOSIS — G4489 Other headache syndrome: Secondary | ICD-10-CM | POA: Diagnosis not present

## 2019-05-03 DIAGNOSIS — Z0000 Encounter for general adult medical examination without abnormal findings: Secondary | ICD-10-CM | POA: Diagnosis not present

## 2019-05-03 DIAGNOSIS — Z1389 Encounter for screening for other disorder: Secondary | ICD-10-CM | POA: Diagnosis not present

## 2019-05-03 DIAGNOSIS — Z79899 Other long term (current) drug therapy: Secondary | ICD-10-CM | POA: Diagnosis not present

## 2019-05-03 DIAGNOSIS — K5792 Diverticulitis of intestine, part unspecified, without perforation or abscess without bleeding: Secondary | ICD-10-CM | POA: Diagnosis not present

## 2019-05-10 ENCOUNTER — Ambulatory Visit: Payer: Medicare Other | Admitting: Physical Therapy

## 2019-05-26 DIAGNOSIS — R300 Dysuria: Secondary | ICD-10-CM | POA: Diagnosis not present

## 2019-06-02 ENCOUNTER — Ambulatory Visit: Payer: Medicare Other | Attending: Geriatric Medicine | Admitting: Physical Therapy

## 2019-06-02 ENCOUNTER — Other Ambulatory Visit: Payer: Self-pay

## 2019-06-02 DIAGNOSIS — H8111 Benign paroxysmal vertigo, right ear: Secondary | ICD-10-CM | POA: Insufficient documentation

## 2019-06-02 DIAGNOSIS — R42 Dizziness and giddiness: Secondary | ICD-10-CM | POA: Insufficient documentation

## 2019-06-02 DIAGNOSIS — H8112 Benign paroxysmal vertigo, left ear: Secondary | ICD-10-CM | POA: Insufficient documentation

## 2019-06-03 ENCOUNTER — Encounter: Payer: Self-pay | Admitting: Physical Therapy

## 2019-06-03 NOTE — Therapy (Signed)
[facility] [address] [place], [place], [address] Phone: [phone]   Fax:  [phone]  Physical Therapy Evaluation  Patient Details  Name: [name] [name] MRN: [id number] Date of Birth: 1922/06/03 Referring Provider (PT): Dr. Lajean Manes   Encounter Date: 06/02/2019  PT End of Session - 06/03/19 1556    Visit Number  1    Number of Visits  5    Date for PT Re-Evaluation  07/03/19    Authorization Type  Medicare    Authorization Time Period  06-02-19 - 09-01-19    PT Start Time  1233    PT Stop Time  1315    PT Time Calculation (min)  42 min    Activity Tolerance  Patient tolerated treatment well    Behavior During Therapy  [facility] for tasks assessed/performed       Past Medical History:  Diagnosis Date  . Arthritis   . Hip fracture [facility]) 2009   surgery  . History of depression   . History of gallstones   . History of IBS   . History of vertigo   . Hyperlipidemia   . Hypertension   . RBBB (right bundle branch block)     Past Surgical History:  Procedure Laterality Date  . CHOLECYSTECTOMY  2008   Dr. Harlow Asa  . [place]   hysterectomy  . TONSILECTOMY, ADENOIDECTOMY, BILATERAL MYRINGOTOMY AND TUBES     age 3 tonsils and adnoids only  . TOTAL HIP ARTHROPLASTY  02/20/2008   fractured hip    There were no vitals filed for this visit.   Subjective Assessment - 06/03/19 1538    Subjective  Pt reports she had onset of vertigo approx. 1 month ago - states she woke up with dizziness; pt is accompanied to PT eval by her daughter-in-law; pt states the vertigo occurs when she turns over onto her left side    Patient is accompained by:  Family member    Pertinent History  Hip fracture with THR in 2009; h/o depression; h/o IBS    Patient Stated Goals  resolve the vertigo    Currently in Pain?  No/denies         [facility] PT Assessment - 06/03/19 0001      Assessment   Medical Diagnosis  Lt  BPPV    Referring Provider (PT)  Dr. Lajean Manes    Onset Date/Surgical Date  --   end of Jan. 2021     Precautions   Precautions  Other (comment)   vertigo     Restrictions   Weight Bearing Restrictions  No      Balance Screen   Has the patient fallen in the past 6 months  No    Has the patient had a decrease in activity level because of a fear of falling?   No    Is the patient reluctant to leave their home because of a fear of falling?   No      Prior Function   Level of Independence  Independent with basic ADLs;Independent with household mobility without device;Independent with community mobility with device      Observation/Other Assessments   Focus on Therapeutic Outcomes (FOTO)   Physical functional score primary measure 64;  Risk adjusted statistical 56     Other Surveys   Dizziness Handicap Inventory (DHI)    Dizziness Handicap Inventory (DHI)   22 (mild handicap)  Vestibular Assessment - 06/03/19 0001      Vestibular Assessment   General Observation  pt is a 86 yr old lady with c/o episodic vertigo that occurs when she rolls onto her left side in bed; pt states dizziness is worse in the am's      Symptom Behavior   Subjective history of current problem  pt reports onset of spinning vertigo approx. 1 month ago - occurs when she rolls onto her left side     Type of Dizziness   Spinning    Frequency of Dizziness  varies depending on the movement    Duration of Dizziness  secs to minutes    Symptom Nature  Positional    Aggravating Factors  Rolling to left;Forward bending;Supine to sit    Relieving Factors  Head stationary;Lying supine    Progression of Symptoms  No change since onset    History of similar episodes  pt reports she had vertigo approx. 5-6 years ago      Positional Testing   Dix-Hallpike  Dix-Hallpike Right;Dix-Hallpike Left    Sidelying Test  Sidelying Right;Sidelying Left      Dix-Hallpike Right   Dix-Hallpike Right Duration   approx. 10 secs    Dix-Hallpike Right Symptoms  No nystagmus      Dix-Hallpike Left   Dix-Hallpike Left Duration  approx. 20 secs     Dix-Hallpike Left Symptoms  No nystagmus      Sidelying Right   Sidelying Right Duration  approx. 8 secs    Sidelying Right Symptoms  No nystagmus      Sidelying Left   Sidelying Left Duration  approx. 13 secs    Sidelying Left Symptoms  No nystagmus          Objective measurements completed on examination: See above findings.     1 rep of Epley maneuver for Lt BPPV (posterior canal) was administered - pt reported feeling nauseous after 1 rep so further treatment Was withheld during today's session           PT Education - 06/03/19 1554    Education Details  pt given info from [place] on BPPV etiology    Person(s) Educated  Patient;Other (comment)   daughter-in-law   Methods  Explanation;Handout    Comprehension  Verbalized understanding          PT Long Term Goals - 06/03/19 1605      PT LONG TERM GOAL #1   Title  Pt will have a (-) Lt Dix-Hallpike test with no c/o vertigo and no nystagmus to indicate resolution of Lt BPPV.    Time  4    Period  Weeks    Status  New    Target Date  07/04/19      PT LONG TERM GOAL #2   Title  Pt will perform all bed mobility with no c/o vertigo.    Time  4    Period  Weeks    Status  New    Target Date  07/04/19      PT LONG TERM GOAL #3   Title  Pt will be independent with HEP for habituation exercises prn should BPPV re-occur.    Time  4    Period  Weeks    Status  New    Target Date  07/04/19      PT LONG TERM GOAL #4   Title  Improve DHI score from 22/100 to </= 10/100 to indicate improvement in vertigo.  Baseline  22/100    Time  4    Period  Weeks    Status  New    Target Date  07/04/19             Plan - 06/03/19 1558    Clinical Impression Statement  Pt presents with (+) Lt Dix-Hallpike test with severe c/o spinning vertigo but no nystagmus noted in test  position.  Pt also reported dizziness in Lt & Rt sidelying positions with Lt > Rt but no nystagmus noted in either sidelying position.  Pt was treated with 1 rep of Epley maneuver for Lt BPPV and c/o nausea after this treatment, so session was ended after this rep due to pt not feeling well.  Pt has symptoms consistent with Lt posterior canalithiasis.    Personal Factors and Comorbidities  Age;Comorbidity 1;Transportation;Past/Current Experience    Comorbidities  hip fracture with THR in 2009; h/o IBS; h/o depression    Examination-Activity Limitations  Locomotion Level;Bed Mobility;Transfers;Bend    Examination-Participation Restrictions  Meal Prep;Cleaning;Laundry;Shop    Stability/Clinical Decision Making  Stable/Uncomplicated    Clinical Decision Making  Low    Rehab Potential  Good    PT Frequency  1x / week    PT Duration  4 weeks    PT Treatment/Interventions  ADLs/Self Care Home Management;Canalith Repostioning;Patient/family education;Vestibular;Therapeutic activities;Therapeutic exercise;Balance training;Neuromuscular re-education    PT Next Visit Plan  Epley for Lt BPPV    Consulted and Agree with Plan of Care  Patient;Other (Comment)   daughter-in-law      Patient will benefit from skilled therapeutic intervention in order to improve the following deficits and impairments:  Dizziness, Difficulty walking, Decreased balance  Visit Diagnosis: BPPV (benign paroxysmal positional vertigo), left - Plan: PT plan of care cert/re-cert  Dizziness and giddiness - Plan: PT plan of care cert/re-cert     Problem List Patient Active Problem List   Diagnosis Date Noted  . Esophageal dysmotility 07/18/2014  . Anxiety state 07/10/2014  . Headache 07/07/2014  . Intractable vomiting with nausea 07/07/2014  . Dehydration 07/07/2014  . UTI (urinary tract infection) 07/07/2014  . History of depression   . History of gallstones   . Hip fracture ([place])   . Hip fracture ([place])   . History of  vertigo   . Arthritis   . RBBB (right bundle branch block)   . History of IBS     [name] [name], [name] [name], PT 06/03/2019, 4:23 PM  [facility] [address] [place], [place], [address] Phone: [phone]   Fax:  [phone]  Name: [name] [name] MRN: [id number] Date of Birth: Jul 21, 1922

## 2019-06-06 ENCOUNTER — Ambulatory Visit: Payer: Medicare Other | Admitting: Physical Therapy

## 2019-06-06 ENCOUNTER — Other Ambulatory Visit: Payer: Self-pay

## 2019-06-06 DIAGNOSIS — R42 Dizziness and giddiness: Secondary | ICD-10-CM | POA: Diagnosis not present

## 2019-06-06 DIAGNOSIS — H8111 Benign paroxysmal vertigo, right ear: Secondary | ICD-10-CM | POA: Diagnosis not present

## 2019-06-06 DIAGNOSIS — H8112 Benign paroxysmal vertigo, left ear: Secondary | ICD-10-CM | POA: Diagnosis not present

## 2019-06-07 ENCOUNTER — Encounter: Payer: Self-pay | Admitting: Physical Therapy

## 2019-06-07 NOTE — Therapy (Signed)
[facility] [address] [place], [place], [address] Phone: [phone]   Fax:  [phone]  Physical Therapy Treatment  Patient Details  Name: [name] [name] MRN: [id number] Date of Birth: [birth year] Referring Provider (PT): Dr. Lajean Manes   Encounter Date: 06/06/2019  PT End of Session - 06/07/19 2312    Visit Number  2    Number of Visits  5    Date for PT Re-Evaluation  07/03/19    Authorization Type  Medicare    Authorization Time Period  06-02-19 - 09-01-19    PT Start Time  1120   pt arrived 20" early for 11:45 appt   PT Stop Time  1155    PT Time Calculation (min)  35 min    Activity Tolerance  Patient tolerated treatment well    Behavior During Therapy  [facility] for tasks assessed/performed       Past Medical History:  Diagnosis Date  . Arthritis   . Hip fracture [facility]) 2009   surgery  . History of depression   . History of gallstones   . History of IBS   . History of vertigo   . Hyperlipidemia   . Hypertension   . RBBB (right bundle branch block)     Past Surgical History:  Procedure Laterality Date  . CHOLECYSTECTOMY  2008   Dr. Harlow Asa  . [place]   hysterectomy  . TONSILECTOMY, ADENOIDECTOMY, BILATERAL MYRINGOTOMY AND TUBES     age 47 tonsils and adnoids only  . TOTAL HIP ARTHROPLASTY  02/20/2008   fractured hip    There were no vitals filed for this visit.  Subjective Assessment - 06/07/19 2308    Subjective  Pt reports she is very dizzy today - no improvement since initial eval last Thursday    Patient is accompained by:  Family member    Pertinent History  Hip fracture with THR in 2009; h/o depression; h/o IBS    Patient Stated Goals  resolve the vertigo    Currently in Pain?  No/denies                        Vestibular Treatment/Exercise - 06/07/19 0001      Vestibular Treatment/Exercise   Vestibular Treatment Provided  Canalith Repositioning    Canalith Repositioning  Epley Manuever Right;Epley Manuever Left       EPLEY MANUEVER RIGHT   Number of Reps   3    Overall Response  Improved Symptoms    Response Details   no nystagmus observed but pt reported dizziness        EPLEY MANUEVER LEFT   Number of Reps   3    Overall Response   Improved Symptoms     RESPONSE DETAILS LEFT  Rt rotary nystagmus was observed on 2nd rep after head was rotated from Lt to Rt side       Neuro Re-ed;  (+) Lt Dix-hallpike test with c/o vertigo but no nystagmus noted in test position                        (+) Rt Dix-hallpike test with c/o vertigo in test position but no nystagmus noted           PT Long Term Goals - 06/07/19 2322      PT LONG TERM GOAL #1   Title  Pt will have a (-) Lt  Dix-Hallpike test with no c/o vertigo and no nystagmus to indicate resolution of Lt BPPV.    Time  4    Period  Weeks    Status  New      PT LONG TERM GOAL #2   Title  Pt will perform all bed mobility with no c/o vertigo.    Time  4    Period  Weeks    Status  New      PT LONG TERM GOAL #3   Title  Pt will be independent with HEP for habituation exercises prn should BPPV re-occur.    Time  4    Period  Weeks    Status  New      PT LONG TERM GOAL #4   Title  Improve DHI score from 22/100 to </= 10/100 to indicate improvement in vertigo.    Baseline  22/100    Time  4    Period  Weeks    Status  New            Plan - 06/07/19 2314    Clinical Impression Statement  Pt appears to have multi canal BPPV - Rt and Lt posterior canalithiasis based on symptoms of spinning vertigo.  Epley maneuver was performed for Rt and Lt BPPV  (posterior canal) 3 reps each side with improvement reported on 3rd rep on each side.  Will continue to assess BPPV at next session    Personal Factors and Comorbidities  Age;Comorbidity 1;Transportation;Past/Current Experience    Comorbidities  hip fracture with THR in 2009; h/o IBS; h/o depression     Examination-Activity Limitations  Locomotion Level;Bed Mobility;Transfers;Bend    Examination-Participation Restrictions  Meal Prep;Cleaning;Laundry;Shop    Stability/Clinical Decision Making  Stable/Uncomplicated    Rehab Potential  Good    PT Frequency  1x / week    PT Duration  4 weeks    PT Treatment/Interventions  ADLs/Self Care Home Management;Canalith Repostioning;Patient/family education;Vestibular;Therapeutic activities;Therapeutic exercise;Balance training;Neuromuscular re-education    PT Next Visit Plan  Epley for Lt and Rt BPPV    Consulted and Agree with Plan of Care  Patient;Other (Comment)   daughter-in-law      Patient will benefit from skilled therapeutic intervention in order to improve the following deficits and impairments:  Dizziness, Difficulty walking, Decreased balance  Visit Diagnosis: BPPV (benign paroxysmal positional vertigo), left  BPPV (benign paroxysmal positional vertigo), right     Problem List Patient Active Problem List   Diagnosis Date Noted  . Esophageal dysmotility 07/18/2014  . Anxiety state 07/10/2014  . Headache 07/07/2014  . Intractable vomiting with nausea 07/07/2014  . Dehydration 07/07/2014  . UTI (urinary tract infection) 07/07/2014  . History of depression   . History of gallstones   . Hip fracture ([place])   . Hip fracture ([place])   . History of vertigo   . Arthritis   . RBBB (right bundle branch block)   . History of IBS     Garey Alleva, [place], PT 06/07/2019, 11:23 PM  [facility] [address] [place], [place], [address] Phone: [phone]   Fax:  [phone]  Name: [name] [name] MRN: [id number] Date of Birth: 08/29/1922

## 2019-06-09 ENCOUNTER — Ambulatory Visit: Payer: Medicare Other | Admitting: Physical Therapy

## 2019-06-09 DIAGNOSIS — H8113 Benign paroxysmal vertigo, bilateral: Secondary | ICD-10-CM | POA: Diagnosis not present

## 2019-06-09 DIAGNOSIS — R300 Dysuria: Secondary | ICD-10-CM | POA: Diagnosis not present

## 2019-06-09 DIAGNOSIS — M545 Low back pain: Secondary | ICD-10-CM | POA: Diagnosis not present

## 2019-06-22 ENCOUNTER — Ambulatory Visit
Admission: RE | Admit: 2019-06-22 | Discharge: 2019-06-22 | Disposition: A | Discharge status: Home / Self Care | Payer: Medicare Other | Source: Ambulatory Visit | Attending: Geriatric Medicine | Admitting: Geriatric Medicine

## 2019-06-22 ENCOUNTER — Other Ambulatory Visit: Payer: Self-pay | Admitting: Geriatric Medicine

## 2019-06-22 DIAGNOSIS — M545 Low back pain, unspecified: Secondary | ICD-10-CM

## 2019-07-18 DIAGNOSIS — I129 Hypertensive chronic kidney disease with stage 1 through stage 4 chronic kidney disease, or unspecified chronic kidney disease: Secondary | ICD-10-CM | POA: Diagnosis not present

## 2019-07-18 DIAGNOSIS — N183 Chronic kidney disease, stage 3 unspecified: Secondary | ICD-10-CM | POA: Diagnosis not present

## 2019-07-18 DIAGNOSIS — K5900 Constipation, unspecified: Secondary | ICD-10-CM | POA: Diagnosis not present

## 2019-07-18 DIAGNOSIS — H8113 Benign paroxysmal vertigo, bilateral: Secondary | ICD-10-CM | POA: Diagnosis not present

## 2019-07-18 DIAGNOSIS — I4510 Unspecified right bundle-branch block: Secondary | ICD-10-CM | POA: Diagnosis not present

## 2019-07-18 DIAGNOSIS — M1990 Unspecified osteoarthritis, unspecified site: Secondary | ICD-10-CM | POA: Diagnosis not present

## 2019-07-18 DIAGNOSIS — K5730 Diverticulosis of large intestine without perforation or abscess without bleeding: Secondary | ICD-10-CM | POA: Diagnosis not present

## 2019-07-18 DIAGNOSIS — K219 Gastro-esophageal reflux disease without esophagitis: Secondary | ICD-10-CM | POA: Diagnosis not present

## 2019-07-18 DIAGNOSIS — M8008XD Age-related osteoporosis with current pathological fracture, vertebra(e), subsequent encounter for fracture with routine healing: Secondary | ICD-10-CM | POA: Diagnosis not present

## 2019-07-18 DIAGNOSIS — E785 Hyperlipidemia, unspecified: Secondary | ICD-10-CM | POA: Diagnosis not present

## 2019-07-18 DIAGNOSIS — F329 Major depressive disorder, single episode, unspecified: Secondary | ICD-10-CM | POA: Diagnosis not present

## 2019-07-22 ENCOUNTER — Other Ambulatory Visit: Payer: Self-pay | Admitting: Geriatric Medicine

## 2019-07-22 DIAGNOSIS — E441 Mild protein-calorie malnutrition: Secondary | ICD-10-CM | POA: Diagnosis not present

## 2019-07-22 DIAGNOSIS — R110 Nausea: Secondary | ICD-10-CM | POA: Diagnosis not present

## 2019-07-22 DIAGNOSIS — S32010G Wedge compression fracture of first lumbar vertebra, subsequent encounter for fracture with delayed healing: Secondary | ICD-10-CM

## 2019-07-22 DIAGNOSIS — R300 Dysuria: Secondary | ICD-10-CM | POA: Diagnosis not present

## 2019-07-22 DIAGNOSIS — K5901 Slow transit constipation: Secondary | ICD-10-CM | POA: Diagnosis not present

## 2019-07-27 DIAGNOSIS — I4510 Unspecified right bundle-branch block: Secondary | ICD-10-CM | POA: Diagnosis not present

## 2019-07-27 DIAGNOSIS — K5730 Diverticulosis of large intestine without perforation or abscess without bleeding: Secondary | ICD-10-CM | POA: Diagnosis not present

## 2019-07-27 DIAGNOSIS — M8008XD Age-related osteoporosis with current pathological fracture, vertebra(e), subsequent encounter for fracture with routine healing: Secondary | ICD-10-CM | POA: Diagnosis not present

## 2019-07-27 DIAGNOSIS — I129 Hypertensive chronic kidney disease with stage 1 through stage 4 chronic kidney disease, or unspecified chronic kidney disease: Secondary | ICD-10-CM | POA: Diagnosis not present

## 2019-07-27 DIAGNOSIS — N183 Chronic kidney disease, stage 3 unspecified: Secondary | ICD-10-CM | POA: Diagnosis not present

## 2019-07-27 DIAGNOSIS — M1990 Unspecified osteoarthritis, unspecified site: Secondary | ICD-10-CM | POA: Diagnosis not present

## 2019-07-28 DIAGNOSIS — M8008XD Age-related osteoporosis with current pathological fracture, vertebra(e), subsequent encounter for fracture with routine healing: Secondary | ICD-10-CM | POA: Diagnosis not present

## 2019-07-28 DIAGNOSIS — R300 Dysuria: Secondary | ICD-10-CM | POA: Diagnosis not present

## 2019-07-28 DIAGNOSIS — K5730 Diverticulosis of large intestine without perforation or abscess without bleeding: Secondary | ICD-10-CM | POA: Diagnosis not present

## 2019-07-28 DIAGNOSIS — I4510 Unspecified right bundle-branch block: Secondary | ICD-10-CM | POA: Diagnosis not present

## 2019-07-28 DIAGNOSIS — I129 Hypertensive chronic kidney disease with stage 1 through stage 4 chronic kidney disease, or unspecified chronic kidney disease: Secondary | ICD-10-CM | POA: Diagnosis not present

## 2019-07-28 DIAGNOSIS — N183 Chronic kidney disease, stage 3 unspecified: Secondary | ICD-10-CM | POA: Diagnosis not present

## 2019-07-28 DIAGNOSIS — M1990 Unspecified osteoarthritis, unspecified site: Secondary | ICD-10-CM | POA: Diagnosis not present

## 2019-08-01 DIAGNOSIS — N183 Chronic kidney disease, stage 3 unspecified: Secondary | ICD-10-CM | POA: Diagnosis not present

## 2019-08-01 DIAGNOSIS — M1990 Unspecified osteoarthritis, unspecified site: Secondary | ICD-10-CM | POA: Diagnosis not present

## 2019-08-01 DIAGNOSIS — I129 Hypertensive chronic kidney disease with stage 1 through stage 4 chronic kidney disease, or unspecified chronic kidney disease: Secondary | ICD-10-CM | POA: Diagnosis not present

## 2019-08-01 DIAGNOSIS — M8008XD Age-related osteoporosis with current pathological fracture, vertebra(e), subsequent encounter for fracture with routine healing: Secondary | ICD-10-CM | POA: Diagnosis not present

## 2019-08-01 DIAGNOSIS — K5730 Diverticulosis of large intestine without perforation or abscess without bleeding: Secondary | ICD-10-CM | POA: Diagnosis not present

## 2019-08-01 DIAGNOSIS — I4510 Unspecified right bundle-branch block: Secondary | ICD-10-CM | POA: Diagnosis not present

## 2019-08-03 DIAGNOSIS — N183 Chronic kidney disease, stage 3 unspecified: Secondary | ICD-10-CM | POA: Diagnosis not present

## 2019-08-03 DIAGNOSIS — M1990 Unspecified osteoarthritis, unspecified site: Secondary | ICD-10-CM | POA: Diagnosis not present

## 2019-08-03 DIAGNOSIS — K5730 Diverticulosis of large intestine without perforation or abscess without bleeding: Secondary | ICD-10-CM | POA: Diagnosis not present

## 2019-08-03 DIAGNOSIS — M8008XD Age-related osteoporosis with current pathological fracture, vertebra(e), subsequent encounter for fracture with routine healing: Secondary | ICD-10-CM | POA: Diagnosis not present

## 2019-08-03 DIAGNOSIS — I129 Hypertensive chronic kidney disease with stage 1 through stage 4 chronic kidney disease, or unspecified chronic kidney disease: Secondary | ICD-10-CM | POA: Diagnosis not present

## 2019-08-03 DIAGNOSIS — I4510 Unspecified right bundle-branch block: Secondary | ICD-10-CM | POA: Diagnosis not present

## 2019-08-08 DIAGNOSIS — M8008XD Age-related osteoporosis with current pathological fracture, vertebra(e), subsequent encounter for fracture with routine healing: Secondary | ICD-10-CM | POA: Diagnosis not present

## 2019-08-08 DIAGNOSIS — K5730 Diverticulosis of large intestine without perforation or abscess without bleeding: Secondary | ICD-10-CM | POA: Diagnosis not present

## 2019-08-08 DIAGNOSIS — I129 Hypertensive chronic kidney disease with stage 1 through stage 4 chronic kidney disease, or unspecified chronic kidney disease: Secondary | ICD-10-CM | POA: Diagnosis not present

## 2019-08-08 DIAGNOSIS — I4510 Unspecified right bundle-branch block: Secondary | ICD-10-CM | POA: Diagnosis not present

## 2019-08-08 DIAGNOSIS — M1990 Unspecified osteoarthritis, unspecified site: Secondary | ICD-10-CM | POA: Diagnosis not present

## 2019-08-08 DIAGNOSIS — N183 Chronic kidney disease, stage 3 unspecified: Secondary | ICD-10-CM | POA: Diagnosis not present

## 2019-08-10 ENCOUNTER — Other Ambulatory Visit: Payer: Self-pay

## 2019-08-10 ENCOUNTER — Ambulatory Visit
Admission: RE | Admit: 2019-08-10 | Discharge: 2019-08-10 | Disposition: A | Discharge status: Home / Self Care | Payer: Medicare Other | Source: Ambulatory Visit | Attending: Geriatric Medicine | Admitting: Geriatric Medicine

## 2019-08-10 DIAGNOSIS — S32010G Wedge compression fracture of first lumbar vertebra, subsequent encounter for fracture with delayed healing: Secondary | ICD-10-CM

## 2019-08-10 DIAGNOSIS — M48061 Spinal stenosis, lumbar region without neurogenic claudication: Secondary | ICD-10-CM | POA: Diagnosis not present

## 2019-08-11 DIAGNOSIS — N183 Chronic kidney disease, stage 3 unspecified: Secondary | ICD-10-CM | POA: Diagnosis not present

## 2019-08-11 DIAGNOSIS — I129 Hypertensive chronic kidney disease with stage 1 through stage 4 chronic kidney disease, or unspecified chronic kidney disease: Secondary | ICD-10-CM | POA: Diagnosis not present

## 2019-08-11 DIAGNOSIS — M1990 Unspecified osteoarthritis, unspecified site: Secondary | ICD-10-CM | POA: Diagnosis not present

## 2019-08-11 DIAGNOSIS — I4510 Unspecified right bundle-branch block: Secondary | ICD-10-CM | POA: Diagnosis not present

## 2019-08-11 DIAGNOSIS — M8008XD Age-related osteoporosis with current pathological fracture, vertebra(e), subsequent encounter for fracture with routine healing: Secondary | ICD-10-CM | POA: Diagnosis not present

## 2019-08-11 DIAGNOSIS — K5730 Diverticulosis of large intestine without perforation or abscess without bleeding: Secondary | ICD-10-CM | POA: Diagnosis not present

## 2019-08-17 ENCOUNTER — Other Ambulatory Visit: Payer: Self-pay | Admitting: Neurological Surgery

## 2019-08-17 ENCOUNTER — Other Ambulatory Visit (HOSPITAL_COMMUNITY): Payer: Self-pay | Admitting: Neurological Surgery

## 2019-08-17 DIAGNOSIS — H8113 Benign paroxysmal vertigo, bilateral: Secondary | ICD-10-CM | POA: Diagnosis not present

## 2019-08-17 DIAGNOSIS — F329 Major depressive disorder, single episode, unspecified: Secondary | ICD-10-CM | POA: Diagnosis not present

## 2019-08-17 DIAGNOSIS — I4510 Unspecified right bundle-branch block: Secondary | ICD-10-CM | POA: Diagnosis not present

## 2019-08-17 DIAGNOSIS — S32010A Wedge compression fracture of first lumbar vertebra, initial encounter for closed fracture: Secondary | ICD-10-CM

## 2019-08-17 DIAGNOSIS — K5900 Constipation, unspecified: Secondary | ICD-10-CM | POA: Diagnosis not present

## 2019-08-17 DIAGNOSIS — E785 Hyperlipidemia, unspecified: Secondary | ICD-10-CM | POA: Diagnosis not present

## 2019-08-17 DIAGNOSIS — K5730 Diverticulosis of large intestine without perforation or abscess without bleeding: Secondary | ICD-10-CM | POA: Diagnosis not present

## 2019-08-17 DIAGNOSIS — K219 Gastro-esophageal reflux disease without esophagitis: Secondary | ICD-10-CM | POA: Diagnosis not present

## 2019-08-17 DIAGNOSIS — M8008XD Age-related osteoporosis with current pathological fracture, vertebra(e), subsequent encounter for fracture with routine healing: Secondary | ICD-10-CM | POA: Diagnosis not present

## 2019-08-17 DIAGNOSIS — M1990 Unspecified osteoarthritis, unspecified site: Secondary | ICD-10-CM | POA: Diagnosis not present

## 2019-08-17 DIAGNOSIS — N183 Chronic kidney disease, stage 3 unspecified: Secondary | ICD-10-CM | POA: Diagnosis not present

## 2019-08-17 DIAGNOSIS — I129 Hypertensive chronic kidney disease with stage 1 through stage 4 chronic kidney disease, or unspecified chronic kidney disease: Secondary | ICD-10-CM | POA: Diagnosis not present

## 2019-08-18 ENCOUNTER — Other Ambulatory Visit (HOSPITAL_COMMUNITY): Payer: Self-pay | Admitting: Neurological Surgery

## 2019-08-18 ENCOUNTER — Other Ambulatory Visit: Payer: Self-pay | Admitting: Neurological Surgery

## 2019-08-18 DIAGNOSIS — M1990 Unspecified osteoarthritis, unspecified site: Secondary | ICD-10-CM | POA: Diagnosis not present

## 2019-08-18 DIAGNOSIS — N183 Chronic kidney disease, stage 3 unspecified: Secondary | ICD-10-CM | POA: Diagnosis not present

## 2019-08-18 DIAGNOSIS — K5730 Diverticulosis of large intestine without perforation or abscess without bleeding: Secondary | ICD-10-CM | POA: Diagnosis not present

## 2019-08-18 DIAGNOSIS — I4510 Unspecified right bundle-branch block: Secondary | ICD-10-CM | POA: Diagnosis not present

## 2019-08-18 DIAGNOSIS — M8008XD Age-related osteoporosis with current pathological fracture, vertebra(e), subsequent encounter for fracture with routine healing: Secondary | ICD-10-CM | POA: Diagnosis not present

## 2019-08-18 DIAGNOSIS — I129 Hypertensive chronic kidney disease with stage 1 through stage 4 chronic kidney disease, or unspecified chronic kidney disease: Secondary | ICD-10-CM | POA: Diagnosis not present

## 2019-08-22 ENCOUNTER — Other Ambulatory Visit: Payer: Self-pay

## 2019-08-22 ENCOUNTER — Ambulatory Visit (HOSPITAL_COMMUNITY)
Admission: RE | Admit: 2019-08-22 | Discharge: 2019-08-22 | Disposition: A | Discharge status: Home / Self Care | Payer: Medicare Other | Source: Ambulatory Visit | Attending: Neurological Surgery | Admitting: Neurological Surgery

## 2019-08-22 DIAGNOSIS — S32010A Wedge compression fracture of first lumbar vertebra, initial encounter for closed fracture: Secondary | ICD-10-CM | POA: Insufficient documentation

## 2019-08-22 DIAGNOSIS — S32011A Stable burst fracture of first lumbar vertebra, initial encounter for closed fracture: Secondary | ICD-10-CM | POA: Diagnosis not present

## 2019-08-23 ENCOUNTER — Inpatient Hospital Stay (HOSPITAL_COMMUNITY): Admission: RE | Admit: 2019-08-23 | Payer: Medicare Other | Source: Home / Self Care | Admitting: Neurological Surgery

## 2019-08-23 ENCOUNTER — Encounter (HOSPITAL_COMMUNITY): Payer: Self-pay

## 2019-08-23 ENCOUNTER — Ambulatory Visit (HOSPITAL_COMMUNITY): Payer: Medicare Other

## 2019-08-23 ENCOUNTER — Encounter (HOSPITAL_COMMUNITY): Admission: RE | Payer: Self-pay | Source: Home / Self Care

## 2019-08-23 SURGERY — POSTERIOR LUMBAR FUSION 1 LEVEL
Anesthesia: General | Site: Back

## 2019-08-24 DIAGNOSIS — I129 Hypertensive chronic kidney disease with stage 1 through stage 4 chronic kidney disease, or unspecified chronic kidney disease: Secondary | ICD-10-CM | POA: Diagnosis not present

## 2019-08-24 DIAGNOSIS — I4510 Unspecified right bundle-branch block: Secondary | ICD-10-CM | POA: Diagnosis not present

## 2019-08-24 DIAGNOSIS — M1990 Unspecified osteoarthritis, unspecified site: Secondary | ICD-10-CM | POA: Diagnosis not present

## 2019-08-24 DIAGNOSIS — K5730 Diverticulosis of large intestine without perforation or abscess without bleeding: Secondary | ICD-10-CM | POA: Diagnosis not present

## 2019-08-24 DIAGNOSIS — M8008XD Age-related osteoporosis with current pathological fracture, vertebra(e), subsequent encounter for fracture with routine healing: Secondary | ICD-10-CM | POA: Diagnosis not present

## 2019-08-24 DIAGNOSIS — N183 Chronic kidney disease, stage 3 unspecified: Secondary | ICD-10-CM | POA: Diagnosis not present

## 2019-08-30 DIAGNOSIS — I10 Essential (primary) hypertension: Secondary | ICD-10-CM | POA: Diagnosis not present

## 2019-08-30 DIAGNOSIS — E441 Mild protein-calorie malnutrition: Secondary | ICD-10-CM | POA: Diagnosis not present

## 2019-08-30 DIAGNOSIS — M25511 Pain in right shoulder: Secondary | ICD-10-CM | POA: Diagnosis not present

## 2019-08-30 DIAGNOSIS — K5901 Slow transit constipation: Secondary | ICD-10-CM | POA: Diagnosis not present

## 2019-08-30 DIAGNOSIS — S32010D Wedge compression fracture of first lumbar vertebra, subsequent encounter for fracture with routine healing: Secondary | ICD-10-CM | POA: Diagnosis not present

## 2019-08-31 DIAGNOSIS — I129 Hypertensive chronic kidney disease with stage 1 through stage 4 chronic kidney disease, or unspecified chronic kidney disease: Secondary | ICD-10-CM | POA: Diagnosis not present

## 2019-08-31 DIAGNOSIS — K5730 Diverticulosis of large intestine without perforation or abscess without bleeding: Secondary | ICD-10-CM | POA: Diagnosis not present

## 2019-08-31 DIAGNOSIS — M1990 Unspecified osteoarthritis, unspecified site: Secondary | ICD-10-CM | POA: Diagnosis not present

## 2019-08-31 DIAGNOSIS — I4510 Unspecified right bundle-branch block: Secondary | ICD-10-CM | POA: Diagnosis not present

## 2019-08-31 DIAGNOSIS — N183 Chronic kidney disease, stage 3 unspecified: Secondary | ICD-10-CM | POA: Diagnosis not present

## 2019-08-31 DIAGNOSIS — M8008XD Age-related osteoporosis with current pathological fracture, vertebra(e), subsequent encounter for fracture with routine healing: Secondary | ICD-10-CM | POA: Diagnosis not present

## 2019-09-05 DIAGNOSIS — K5730 Diverticulosis of large intestine without perforation or abscess without bleeding: Secondary | ICD-10-CM | POA: Diagnosis not present

## 2019-09-05 DIAGNOSIS — I4510 Unspecified right bundle-branch block: Secondary | ICD-10-CM | POA: Diagnosis not present

## 2019-09-05 DIAGNOSIS — N183 Chronic kidney disease, stage 3 unspecified: Secondary | ICD-10-CM | POA: Diagnosis not present

## 2019-09-05 DIAGNOSIS — M1990 Unspecified osteoarthritis, unspecified site: Secondary | ICD-10-CM | POA: Diagnosis not present

## 2019-09-05 DIAGNOSIS — M8008XD Age-related osteoporosis with current pathological fracture, vertebra(e), subsequent encounter for fracture with routine healing: Secondary | ICD-10-CM | POA: Diagnosis not present

## 2019-09-05 DIAGNOSIS — I129 Hypertensive chronic kidney disease with stage 1 through stage 4 chronic kidney disease, or unspecified chronic kidney disease: Secondary | ICD-10-CM | POA: Diagnosis not present

## 2019-09-14 DIAGNOSIS — I129 Hypertensive chronic kidney disease with stage 1 through stage 4 chronic kidney disease, or unspecified chronic kidney disease: Secondary | ICD-10-CM | POA: Diagnosis not present

## 2019-09-14 DIAGNOSIS — N183 Chronic kidney disease, stage 3 unspecified: Secondary | ICD-10-CM | POA: Diagnosis not present

## 2019-09-14 DIAGNOSIS — I4510 Unspecified right bundle-branch block: Secondary | ICD-10-CM | POA: Diagnosis not present

## 2019-09-14 DIAGNOSIS — K5730 Diverticulosis of large intestine without perforation or abscess without bleeding: Secondary | ICD-10-CM | POA: Diagnosis not present

## 2019-09-14 DIAGNOSIS — M8008XD Age-related osteoporosis with current pathological fracture, vertebra(e), subsequent encounter for fracture with routine healing: Secondary | ICD-10-CM | POA: Diagnosis not present

## 2019-09-14 DIAGNOSIS — M1990 Unspecified osteoarthritis, unspecified site: Secondary | ICD-10-CM | POA: Diagnosis not present

## 2019-11-14 ENCOUNTER — Emergency Department (HOSPITAL_COMMUNITY): Payer: Medicare Other

## 2019-11-14 ENCOUNTER — Inpatient Hospital Stay (HOSPITAL_COMMUNITY): Payer: Medicare Other

## 2019-11-14 ENCOUNTER — Encounter (HOSPITAL_COMMUNITY)
Admission: EM | Disposition: A | Discharge status: Skilled Nursing Facility | Payer: Self-pay | Source: Home / Self Care | Attending: Internal Medicine

## 2019-11-14 ENCOUNTER — Other Ambulatory Visit: Payer: Self-pay

## 2019-11-14 ENCOUNTER — Inpatient Hospital Stay (HOSPITAL_COMMUNITY): Payer: Medicare Other | Admitting: Certified Registered"

## 2019-11-14 ENCOUNTER — Inpatient Hospital Stay (HOSPITAL_COMMUNITY)
Admission: EM | Admit: 2019-11-14 | Discharge: 2019-11-22 | DRG: 522 | Disposition: A | Discharge status: Skilled Nursing Facility | Payer: Medicare Other | Attending: Internal Medicine | Admitting: Internal Medicine

## 2019-11-14 ENCOUNTER — Encounter (HOSPITAL_COMMUNITY): Payer: Self-pay | Admitting: Internal Medicine

## 2019-11-14 DIAGNOSIS — S73005A Unspecified dislocation of left hip, initial encounter: Secondary | ICD-10-CM

## 2019-11-14 DIAGNOSIS — K589 Irritable bowel syndrome without diarrhea: Secondary | ICD-10-CM | POA: Diagnosis not present

## 2019-11-14 DIAGNOSIS — S72002A Fracture of unspecified part of neck of left femur, initial encounter for closed fracture: Secondary | ICD-10-CM | POA: Diagnosis not present

## 2019-11-14 DIAGNOSIS — D62 Acute posthemorrhagic anemia: Secondary | ICD-10-CM | POA: Diagnosis not present

## 2019-11-14 DIAGNOSIS — S299XXA Unspecified injury of thorax, initial encounter: Secondary | ICD-10-CM | POA: Diagnosis not present

## 2019-11-14 DIAGNOSIS — D519 Vitamin B12 deficiency anemia, unspecified: Secondary | ICD-10-CM | POA: Diagnosis present

## 2019-11-14 DIAGNOSIS — S81811A Laceration without foreign body, right lower leg, initial encounter: Secondary | ICD-10-CM | POA: Diagnosis not present

## 2019-11-14 DIAGNOSIS — R42 Dizziness and giddiness: Secondary | ICD-10-CM | POA: Diagnosis not present

## 2019-11-14 DIAGNOSIS — I480 Paroxysmal atrial fibrillation: Secondary | ICD-10-CM | POA: Diagnosis not present

## 2019-11-14 DIAGNOSIS — E876 Hypokalemia: Secondary | ICD-10-CM

## 2019-11-14 DIAGNOSIS — I471 Supraventricular tachycardia: Secondary | ICD-10-CM | POA: Diagnosis not present

## 2019-11-14 DIAGNOSIS — E785 Hyperlipidemia, unspecified: Secondary | ICD-10-CM | POA: Diagnosis not present

## 2019-11-14 DIAGNOSIS — R1310 Dysphagia, unspecified: Secondary | ICD-10-CM | POA: Diagnosis not present

## 2019-11-14 DIAGNOSIS — E43 Unspecified severe protein-calorie malnutrition: Secondary | ICD-10-CM | POA: Diagnosis not present

## 2019-11-14 DIAGNOSIS — Y92009 Unspecified place in unspecified non-institutional (private) residence as the place of occurrence of the external cause: Secondary | ICD-10-CM

## 2019-11-14 DIAGNOSIS — Z20822 Contact with and (suspected) exposure to covid-19: Secondary | ICD-10-CM | POA: Diagnosis not present

## 2019-11-14 DIAGNOSIS — Z01818 Encounter for other preprocedural examination: Secondary | ICD-10-CM | POA: Diagnosis not present

## 2019-11-14 DIAGNOSIS — H9190 Unspecified hearing loss, unspecified ear: Secondary | ICD-10-CM | POA: Diagnosis present

## 2019-11-14 DIAGNOSIS — R000 Tachycardia, unspecified: Secondary | ICD-10-CM | POA: Diagnosis not present

## 2019-11-14 DIAGNOSIS — R404 Transient alteration of awareness: Secondary | ICD-10-CM | POA: Diagnosis not present

## 2019-11-14 DIAGNOSIS — Y9389 Activity, other specified: Secondary | ICD-10-CM

## 2019-11-14 DIAGNOSIS — Z7189 Other specified counseling: Secondary | ICD-10-CM | POA: Diagnosis not present

## 2019-11-14 DIAGNOSIS — R64 Cachexia: Secondary | ICD-10-CM | POA: Diagnosis present

## 2019-11-14 DIAGNOSIS — D638 Anemia in other chronic diseases classified elsewhere: Secondary | ICD-10-CM | POA: Diagnosis present

## 2019-11-14 DIAGNOSIS — M1990 Unspecified osteoarthritis, unspecified site: Secondary | ICD-10-CM | POA: Diagnosis not present

## 2019-11-14 DIAGNOSIS — I959 Hypotension, unspecified: Secondary | ICD-10-CM | POA: Diagnosis not present

## 2019-11-14 DIAGNOSIS — S8011XA Contusion of right lower leg, initial encounter: Secondary | ICD-10-CM

## 2019-11-14 DIAGNOSIS — I340 Nonrheumatic mitral (valve) insufficiency: Secondary | ICD-10-CM | POA: Diagnosis not present

## 2019-11-14 DIAGNOSIS — E860 Dehydration: Secondary | ICD-10-CM | POA: Diagnosis not present

## 2019-11-14 DIAGNOSIS — R0989 Other specified symptoms and signs involving the circulatory and respiratory systems: Secondary | ICD-10-CM

## 2019-11-14 DIAGNOSIS — Z23 Encounter for immunization: Secondary | ICD-10-CM

## 2019-11-14 DIAGNOSIS — Z800 Family history of malignant neoplasm of digestive organs: Secondary | ICD-10-CM

## 2019-11-14 DIAGNOSIS — R5381 Other malaise: Secondary | ICD-10-CM | POA: Diagnosis not present

## 2019-11-14 DIAGNOSIS — J069 Acute upper respiratory infection, unspecified: Secondary | ICD-10-CM | POA: Diagnosis not present

## 2019-11-14 DIAGNOSIS — S81812A Laceration without foreign body, left lower leg, initial encounter: Secondary | ICD-10-CM

## 2019-11-14 DIAGNOSIS — R279 Unspecified lack of coordination: Secondary | ICD-10-CM | POA: Diagnosis not present

## 2019-11-14 DIAGNOSIS — Z515 Encounter for palliative care: Secondary | ICD-10-CM | POA: Diagnosis not present

## 2019-11-14 DIAGNOSIS — I4581 Long QT syndrome: Secondary | ICD-10-CM | POA: Diagnosis present

## 2019-11-14 DIAGNOSIS — F29 Unspecified psychosis not due to a substance or known physiological condition: Secondary | ICD-10-CM | POA: Diagnosis not present

## 2019-11-14 DIAGNOSIS — Z885 Allergy status to narcotic agent status: Secondary | ICD-10-CM | POA: Diagnosis not present

## 2019-11-14 DIAGNOSIS — W010XXA Fall on same level from slipping, tripping and stumbling without subsequent striking against object, initial encounter: Secondary | ICD-10-CM | POA: Diagnosis present

## 2019-11-14 DIAGNOSIS — Z831 Family history of other infectious and parasitic diseases: Secondary | ICD-10-CM

## 2019-11-14 DIAGNOSIS — R2681 Unsteadiness on feet: Secondary | ICD-10-CM | POA: Diagnosis not present

## 2019-11-14 DIAGNOSIS — I69828 Other speech and language deficits following other cerebrovascular disease: Secondary | ICD-10-CM | POA: Diagnosis not present

## 2019-11-14 DIAGNOSIS — Z96642 Presence of left artificial hip joint: Secondary | ICD-10-CM | POA: Diagnosis not present

## 2019-11-14 DIAGNOSIS — Z888 Allergy status to other drugs, medicaments and biological substances status: Secondary | ICD-10-CM

## 2019-11-14 DIAGNOSIS — Z8781 Personal history of (healed) traumatic fracture: Secondary | ICD-10-CM

## 2019-11-14 DIAGNOSIS — W19XXXA Unspecified fall, initial encounter: Secondary | ICD-10-CM

## 2019-11-14 DIAGNOSIS — S72012A Unspecified intracapsular fracture of left femur, initial encounter for closed fracture: Secondary | ICD-10-CM | POA: Diagnosis not present

## 2019-11-14 DIAGNOSIS — I10 Essential (primary) hypertension: Secondary | ICD-10-CM | POA: Diagnosis present

## 2019-11-14 DIAGNOSIS — Z66 Do not resuscitate: Secondary | ICD-10-CM | POA: Diagnosis not present

## 2019-11-14 DIAGNOSIS — D513 Other dietary vitamin B12 deficiency anemia: Secondary | ICD-10-CM | POA: Diagnosis not present

## 2019-11-14 DIAGNOSIS — Z681 Body mass index (BMI) 19 or less, adult: Secondary | ICD-10-CM

## 2019-11-14 DIAGNOSIS — R41841 Cognitive communication deficit: Secondary | ICD-10-CM | POA: Diagnosis not present

## 2019-11-14 DIAGNOSIS — I4891 Unspecified atrial fibrillation: Secondary | ICD-10-CM | POA: Diagnosis not present

## 2019-11-14 DIAGNOSIS — I4510 Unspecified right bundle-branch block: Secondary | ICD-10-CM | POA: Diagnosis not present

## 2019-11-14 DIAGNOSIS — Z841 Family history of disorders of kidney and ureter: Secondary | ICD-10-CM

## 2019-11-14 DIAGNOSIS — Z79899 Other long term (current) drug therapy: Secondary | ICD-10-CM

## 2019-11-14 DIAGNOSIS — Z9049 Acquired absence of other specified parts of digestive tract: Secondary | ICD-10-CM

## 2019-11-14 DIAGNOSIS — Z8249 Family history of ischemic heart disease and other diseases of the circulatory system: Secondary | ICD-10-CM

## 2019-11-14 DIAGNOSIS — M25512 Pain in left shoulder: Secondary | ICD-10-CM | POA: Diagnosis not present

## 2019-11-14 DIAGNOSIS — Z471 Aftercare following joint replacement surgery: Secondary | ICD-10-CM | POA: Diagnosis not present

## 2019-11-14 DIAGNOSIS — K449 Diaphragmatic hernia without obstruction or gangrene: Secondary | ICD-10-CM | POA: Diagnosis present

## 2019-11-14 DIAGNOSIS — R410 Disorientation, unspecified: Secondary | ICD-10-CM | POA: Diagnosis not present

## 2019-11-14 DIAGNOSIS — R2689 Other abnormalities of gait and mobility: Secondary | ICD-10-CM | POA: Diagnosis not present

## 2019-11-14 DIAGNOSIS — G8911 Acute pain due to trauma: Secondary | ICD-10-CM

## 2019-11-14 DIAGNOSIS — Y92019 Unspecified place in single-family (private) house as the place of occurrence of the external cause: Secondary | ICD-10-CM | POA: Diagnosis not present

## 2019-11-14 DIAGNOSIS — S72002P Fracture of unspecified part of neck of left femur, subsequent encounter for closed fracture with malunion: Secondary | ICD-10-CM | POA: Diagnosis not present

## 2019-11-14 DIAGNOSIS — Z419 Encounter for procedure for purposes other than remedying health state, unspecified: Secondary | ICD-10-CM

## 2019-11-14 DIAGNOSIS — M5489 Other dorsalgia: Secondary | ICD-10-CM | POA: Diagnosis not present

## 2019-11-14 DIAGNOSIS — I361 Nonrheumatic tricuspid (valve) insufficiency: Secondary | ICD-10-CM | POA: Diagnosis not present

## 2019-11-14 DIAGNOSIS — Z90710 Acquired absence of both cervix and uterus: Secondary | ICD-10-CM

## 2019-11-14 DIAGNOSIS — S72042A Displaced fracture of base of neck of left femur, initial encounter for closed fracture: Secondary | ICD-10-CM | POA: Diagnosis not present

## 2019-11-14 DIAGNOSIS — R52 Pain, unspecified: Secondary | ICD-10-CM | POA: Diagnosis not present

## 2019-11-14 DIAGNOSIS — R9431 Abnormal electrocardiogram [ECG] [EKG]: Secondary | ICD-10-CM | POA: Diagnosis present

## 2019-11-14 DIAGNOSIS — S72002D Fracture of unspecified part of neck of left femur, subsequent encounter for closed fracture with routine healing: Secondary | ICD-10-CM | POA: Diagnosis not present

## 2019-11-14 DIAGNOSIS — K224 Dyskinesia of esophagus: Secondary | ICD-10-CM | POA: Diagnosis not present

## 2019-11-14 DIAGNOSIS — R1319 Other dysphagia: Secondary | ICD-10-CM | POA: Diagnosis present

## 2019-11-14 DIAGNOSIS — Z9181 History of falling: Secondary | ICD-10-CM | POA: Diagnosis not present

## 2019-11-14 DIAGNOSIS — M6281 Muscle weakness (generalized): Secondary | ICD-10-CM | POA: Diagnosis not present

## 2019-11-14 DIAGNOSIS — R09A2 Foreign body sensation, throat: Secondary | ICD-10-CM

## 2019-11-14 DIAGNOSIS — R0902 Hypoxemia: Secondary | ICD-10-CM | POA: Diagnosis not present

## 2019-11-14 DIAGNOSIS — Z743 Need for continuous supervision: Secondary | ICD-10-CM | POA: Diagnosis not present

## 2019-11-14 DIAGNOSIS — S4992XA Unspecified injury of left shoulder and upper arm, initial encounter: Secondary | ICD-10-CM | POA: Diagnosis not present

## 2019-11-14 DIAGNOSIS — M79661 Pain in right lower leg: Secondary | ICD-10-CM | POA: Diagnosis not present

## 2019-11-14 HISTORY — PX: TOTAL HIP ARTHROPLASTY: SHX124

## 2019-11-14 LAB — CBC WITH DIFFERENTIAL/PLATELET
Abs Immature Granulocytes: 0.05 10*3/uL (ref 0.00–0.07)
Basophils Absolute: 0 10*3/uL (ref 0.0–0.1)
Basophils Relative: 0 %
Eosinophils Absolute: 0.1 10*3/uL (ref 0.0–0.5)
Eosinophils Relative: 1 %
HCT: 40.8 % (ref 36.0–46.0)
Hemoglobin: 13.1 g/dL (ref 12.0–15.0)
Immature Granulocytes: 1 %
Lymphocytes Relative: 7 %
Lymphs Abs: 0.7 10*3/uL (ref 0.7–4.0)
MCH: 29.4 pg (ref 26.0–34.0)
MCHC: 32.1 g/dL (ref 30.0–36.0)
MCV: 91.7 fL (ref 80.0–100.0)
Monocytes Absolute: 0.8 10*3/uL (ref 0.1–1.0)
Monocytes Relative: 7 %
Neutro Abs: 9.1 10*3/uL — ABNORMAL HIGH (ref 1.7–7.7)
Neutrophils Relative %: 84 %
Platelets: 272 10*3/uL (ref 150–400)
RBC: 4.45 MIL/uL (ref 3.87–5.11)
RDW: 12.8 % (ref 11.5–15.5)
WBC: 10.7 10*3/uL — ABNORMAL HIGH (ref 4.0–10.5)
nRBC: 0 % (ref 0.0–0.2)

## 2019-11-14 LAB — TYPE AND SCREEN
ABO/RH(D): B POS
Antibody Screen: NEGATIVE

## 2019-11-14 LAB — URINALYSIS, ROUTINE W REFLEX MICROSCOPIC
Bilirubin Urine: NEGATIVE
Glucose, UA: NEGATIVE mg/dL
Hgb urine dipstick: NEGATIVE
Ketones, ur: 5 mg/dL — AB
Leukocytes,Ua: NEGATIVE
Nitrite: NEGATIVE
Protein, ur: NEGATIVE mg/dL
Specific Gravity, Urine: 1.009 (ref 1.005–1.030)
pH: 8 (ref 5.0–8.0)

## 2019-11-14 LAB — BASIC METABOLIC PANEL
Anion gap: 9 (ref 5–15)
BUN: 10 mg/dL (ref 8–23)
CO2: 23 mmol/L (ref 22–32)
Calcium: 8.9 mg/dL (ref 8.9–10.3)
Chloride: 107 mmol/L (ref 98–111)
Creatinine, Ser: 0.48 mg/dL (ref 0.44–1.00)
GFR calc Af Amer: >60 mL/min (ref >60)
GFR calc non Af Amer: >60 mL/min (ref >60)
Glucose, Bld: 135 mg/dL — ABNORMAL HIGH (ref 70–99)
Potassium: 3.2 mmol/L — ABNORMAL LOW (ref 3.5–5.1)
Sodium: 139 mmol/L (ref 135–145)

## 2019-11-14 LAB — SURGICAL PCR SCREEN
MRSA, PCR: NEGATIVE
Staphylococcus aureus: NEGATIVE

## 2019-11-14 LAB — MAGNESIUM: Magnesium: 1.9 mg/dL (ref 1.7–2.4)

## 2019-11-14 LAB — SARS CORONAVIRUS 2 BY RT PCR (HOSPITAL ORDER, PERFORMED IN ~~LOC~~ HOSPITAL LAB): SARS Coronavirus 2: NEGATIVE

## 2019-11-14 SURGERY — ARTHROPLASTY, HIP, TOTAL, ANTERIOR APPROACH
Anesthesia: Monitor Anesthesia Care | Site: Hip | Laterality: Left

## 2019-11-14 MED ORDER — SODIUM CHLORIDE (PF) 0.9 % IJ SOLN
INTRAMUSCULAR | Status: DC | PRN
  Administered 2019-11-14: 30 mL

## 2019-11-14 MED ORDER — ACETAMINOPHEN 500 MG PO TABS
500.0000 mg | ORAL_TABLET | Freq: Four times a day (QID) | ORAL | Status: DC
  Administered 2019-11-15 (×2): 500 mg via ORAL
  Filled 2019-11-14 (×3): qty 1

## 2019-11-14 MED ORDER — TETANUS-DIPHTH-ACELL PERTUSSIS 5-2.5-18.5 LF-MCG/0.5 IM SUSP
0.5000 mL | Freq: Once | INTRAMUSCULAR | Status: AC
  Administered 2019-11-14: 0.5 mL via INTRAMUSCULAR
  Filled 2019-11-14: qty 0.5

## 2019-11-14 MED ORDER — AMLODIPINE BESYLATE 5 MG PO TABS: 2.5000 mg | ORAL_TABLET | Freq: Every day | ORAL | Status: DC

## 2019-11-14 MED ORDER — FENTANYL CITRATE (PF) 100 MCG/2ML IJ SOLN
INTRAMUSCULAR | Status: AC
  Filled 2019-11-14: qty 2

## 2019-11-14 MED ORDER — MAGNESIUM SULFATE 2 GM/50ML IV SOLN
2.0000 g | Freq: Once | INTRAVENOUS | Status: AC
  Administered 2019-11-14: 2 g via INTRAVENOUS
  Filled 2019-11-14: qty 50

## 2019-11-14 MED ORDER — ENSURE PRE-SURGERY PO LIQD
296.0000 mL | Freq: Once | ORAL | Status: DC
  Filled 2019-11-14: qty 296

## 2019-11-14 MED ORDER — ENOXAPARIN SODIUM 30 MG/0.3ML ~~LOC~~ SOLN
30.0000 mg | SUBCUTANEOUS | Status: DC
  Administered 2019-11-15 – 2019-11-22 (×8): 30 mg via SUBCUTANEOUS
  Filled 2019-11-14 (×9): qty 0.3

## 2019-11-14 MED ORDER — ONDANSETRON HCL 4 MG/2ML IJ SOLN
INTRAMUSCULAR | Status: AC
  Filled 2019-11-14: qty 2

## 2019-11-14 MED ORDER — ONDANSETRON HCL 4 MG/2ML IJ SOLN
4.0000 mg | Freq: Four times a day (QID) | INTRAMUSCULAR | Status: DC | PRN
  Administered 2019-11-14: 4 mg via INTRAVENOUS

## 2019-11-14 MED ORDER — POTASSIUM CHLORIDE 10 MEQ/100ML IV SOLN
10.0000 meq | INTRAVENOUS | Status: AC
  Administered 2019-11-14: 10 meq via INTRAVENOUS
  Filled 2019-11-14: qty 100

## 2019-11-14 MED ORDER — MORPHINE SULFATE (PF) 2 MG/ML IV SOLN
2.0000 mg | INTRAVENOUS | Status: DC | PRN
  Administered 2019-11-14: 2 mg via INTRAVENOUS
  Filled 2019-11-14: qty 1

## 2019-11-14 MED ORDER — SODIUM CHLORIDE (PF) 0.9 % IJ SOLN
INTRAMUSCULAR | Status: AC
  Filled 2019-11-14: qty 50

## 2019-11-14 MED ORDER — POVIDONE-IODINE 10 % EX SWAB
2.0000 "application " | Freq: Once | CUTANEOUS | Status: AC
  Administered 2019-11-14: 2 via TOPICAL

## 2019-11-14 MED ORDER — HYDROCODONE-ACETAMINOPHEN 7.5-325 MG PO TABS
1.0000 | ORAL_TABLET | ORAL | Status: DC | PRN
  Administered 2019-11-16 – 2019-11-18 (×4): 1 via ORAL
  Administered 2019-11-21: 2 via ORAL
  Administered 2019-11-22: 1 via ORAL
  Administered 2019-11-22: 2 via ORAL
  Filled 2019-11-14 (×2): qty 2
  Filled 2019-11-14 (×5): qty 1

## 2019-11-14 MED ORDER — PHENYLEPHRINE HCL-NACL 10-0.9 MG/250ML-% IV SOLN
INTRAVENOUS | Status: DC | PRN
  Administered 2019-11-14: 25 ug/min via INTRAVENOUS

## 2019-11-14 MED ORDER — METHOCARBAMOL 500 MG IVPB - SIMPLE MED
500.0000 mg | Freq: Four times a day (QID) | INTRAVENOUS | Status: DC | PRN
  Administered 2019-11-14: 500 mg via INTRAVENOUS
  Filled 2019-11-14: qty 50

## 2019-11-14 MED ORDER — FENTANYL CITRATE (PF) 100 MCG/2ML IJ SOLN
25.0000 ug | INTRAMUSCULAR | Status: DC | PRN
  Administered 2019-11-14: 50 ug via INTRAVENOUS

## 2019-11-14 MED ORDER — FENTANYL CITRATE (PF) 100 MCG/2ML IJ SOLN
INTRAMUSCULAR | Status: DC | PRN
  Administered 2019-11-14: 25 ug via INTRAVENOUS
  Administered 2019-11-14: 12.5 ug via INTRAVENOUS
  Administered 2019-11-14 (×2): 25 ug via INTRAVENOUS
  Administered 2019-11-14: 12.5 ug via INTRAVENOUS

## 2019-11-14 MED ORDER — MORPHINE SULFATE (PF) 2 MG/ML IV SOLN
2.0000 mg | Freq: Once | INTRAVENOUS | Status: AC
  Administered 2019-11-14: 2 mg via INTRAVENOUS
  Filled 2019-11-14: qty 1

## 2019-11-14 MED ORDER — EPHEDRINE SULFATE-NACL 50-0.9 MG/10ML-% IV SOSY
PREFILLED_SYRINGE | INTRAVENOUS | Status: DC | PRN
  Administered 2019-11-14: 10 mg via INTRAVENOUS

## 2019-11-14 MED ORDER — LACTATED RINGERS IV SOLN: INTRAVENOUS | Status: DC

## 2019-11-14 MED ORDER — HYDROCODONE-ACETAMINOPHEN 5-325 MG PO TABS: 2.0000 | ORAL_TABLET | Freq: Four times a day (QID) | ORAL | Status: DC | PRN

## 2019-11-14 MED ORDER — PROPOFOL 10 MG/ML IV BOLUS
INTRAVENOUS | Status: DC | PRN
  Administered 2019-11-14: 10 mg via INTRAVENOUS
  Administered 2019-11-14: 30 mg via INTRAVENOUS

## 2019-11-14 MED ORDER — METHOCARBAMOL 500 MG PO TABS
500.0000 mg | ORAL_TABLET | Freq: Four times a day (QID) | ORAL | Status: DC | PRN
  Administered 2019-11-18: 500 mg via ORAL
  Filled 2019-11-14: qty 1

## 2019-11-14 MED ORDER — KETOROLAC TROMETHAMINE 30 MG/ML IJ SOLN
INTRAMUSCULAR | Status: AC
  Filled 2019-11-14: qty 1

## 2019-11-14 MED ORDER — HALOPERIDOL LACTATE 5 MG/ML IJ SOLN
1.0000 mg | Freq: Four times a day (QID) | INTRAMUSCULAR | Status: DC | PRN
  Administered 2019-11-15: 1 mg via INTRAVENOUS
  Filled 2019-11-14: qty 1

## 2019-11-14 MED ORDER — PROPOFOL 500 MG/50ML IV EMUL
INTRAVENOUS | Status: DC | PRN
  Administered 2019-11-14: 25 ug/kg/min via INTRAVENOUS

## 2019-11-14 MED ORDER — CEFAZOLIN SODIUM-DEXTROSE 2-4 GM/100ML-% IV SOLN
2.0000 g | INTRAVENOUS | Status: AC
  Administered 2019-11-14: 2 g via INTRAVENOUS

## 2019-11-14 MED ORDER — SODIUM CHLORIDE 0.9 % IR SOLN
Status: DC | PRN
  Administered 2019-11-14: 1000 mL

## 2019-11-14 MED ORDER — METHOCARBAMOL 500 MG IVPB - SIMPLE MED
INTRAVENOUS | Status: AC
  Filled 2019-11-14: qty 50

## 2019-11-14 MED ORDER — MORPHINE SULFATE (PF) 2 MG/ML IV SOLN
0.5000 mg | INTRAVENOUS | Status: DC | PRN
  Administered 2019-11-16 – 2019-11-20 (×3): 1 mg via INTRAVENOUS
  Filled 2019-11-14 (×3): qty 1

## 2019-11-14 MED ORDER — METHOCARBAMOL 1000 MG/10ML IJ SOLN: 500.0000 mg | Freq: Four times a day (QID) | INTRAVENOUS | Status: DC | PRN

## 2019-11-14 MED ORDER — PROPOFOL 10 MG/ML IV BOLUS
INTRAVENOUS | Status: AC
  Filled 2019-11-14: qty 20

## 2019-11-14 MED ORDER — PROPOFOL 500 MG/50ML IV EMUL
INTRAVENOUS | Status: AC
  Filled 2019-11-14: qty 50

## 2019-11-14 MED ORDER — CHLORHEXIDINE GLUCONATE 0.12 % MT SOLN
15.0000 mL | Freq: Once | OROMUCOSAL | Status: AC
  Administered 2019-11-14: 15 mL via OROMUCOSAL

## 2019-11-14 MED ORDER — ONDANSETRON HCL 4 MG PO TABS: 4.0000 mg | ORAL_TABLET | Freq: Four times a day (QID) | ORAL | Status: DC | PRN

## 2019-11-14 MED ORDER — LIDOCAINE-EPINEPHRINE (PF) 2 %-1:200000 IJ SOLN
20.0000 mL | Freq: Once | INTRAMUSCULAR | Status: AC
  Administered 2019-11-14: 20 mL
  Filled 2019-11-14: qty 20

## 2019-11-14 MED ORDER — PHENOL 1.4 % MT LIQD
1.0000 | OROMUCOSAL | Status: DC | PRN
  Administered 2019-11-15: 1 via OROMUCOSAL
  Filled 2019-11-14: qty 177

## 2019-11-14 MED ORDER — CHLORHEXIDINE GLUCONATE 4 % EX LIQD
60.0000 mL | Freq: Once | CUTANEOUS | Status: AC
  Administered 2019-11-14: 4 via TOPICAL

## 2019-11-14 MED ORDER — METHOCARBAMOL 500 MG PO TABS
500.0000 mg | ORAL_TABLET | Freq: Four times a day (QID) | ORAL | Status: DC | PRN
Start: 2019-11-14 — End: 2019-11-14

## 2019-11-14 MED ORDER — POVIDONE-IODINE 10 % EX SWAB: 2.0000 "application " | Freq: Once | CUTANEOUS | Status: DC

## 2019-11-14 MED ORDER — CEFAZOLIN SODIUM-DEXTROSE 2-4 GM/100ML-% IV SOLN
INTRAVENOUS | Status: AC
  Filled 2019-11-14: qty 100

## 2019-11-14 MED ORDER — BUPIVACAINE-EPINEPHRINE (PF) 0.25% -1:200000 IJ SOLN
INTRAMUSCULAR | Status: AC
  Filled 2019-11-14: qty 30

## 2019-11-14 MED ORDER — DOCUSATE SODIUM 100 MG PO CAPS
100.0000 mg | ORAL_CAPSULE | Freq: Two times a day (BID) | ORAL | Status: DC
  Administered 2019-11-15 – 2019-11-22 (×16): 100 mg via ORAL
  Filled 2019-11-14 (×18): qty 1

## 2019-11-14 MED ORDER — ACETAMINOPHEN 500 MG PO TABS: 500.0000 mg | ORAL_TABLET | Freq: Four times a day (QID) | ORAL | Status: DC | PRN

## 2019-11-14 MED ORDER — ONDANSETRON HCL 4 MG/2ML IJ SOLN
4.0000 mg | Freq: Once | INTRAMUSCULAR | Status: AC | PRN
  Administered 2019-11-14: 4 mg via INTRAVENOUS

## 2019-11-14 MED ORDER — TRANEXAMIC ACID-NACL 1000-0.7 MG/100ML-% IV SOLN
INTRAVENOUS | Status: AC
  Filled 2019-11-14: qty 100

## 2019-11-14 MED ORDER — PHENYLEPHRINE HCL-NACL 10-0.9 MG/250ML-% IV SOLN
INTRAVENOUS | Status: AC
  Filled 2019-11-14: qty 250

## 2019-11-14 MED ORDER — BUPIVACAINE-EPINEPHRINE 0.25% -1:200000 IJ SOLN
INTRAMUSCULAR | Status: DC | PRN
  Administered 2019-11-14: 30 mL

## 2019-11-14 MED ORDER — TRANEXAMIC ACID-NACL 1000-0.7 MG/100ML-% IV SOLN
1000.0000 mg | INTRAVENOUS | Status: AC
  Administered 2019-11-14: 1000 mg via INTRAVENOUS

## 2019-11-14 MED ORDER — MORPHINE SULFATE (PF) 2 MG/ML IV SOLN
0.5000 mg | INTRAVENOUS | Status: DC | PRN
Start: 2019-11-14 — End: 2019-11-14

## 2019-11-14 MED ORDER — KETOROLAC TROMETHAMINE 30 MG/ML IJ SOLN
INTRAMUSCULAR | Status: DC | PRN
  Administered 2019-11-14: 30 mg

## 2019-11-14 MED ORDER — KCL IN DEXTROSE-NACL 30-5-0.45 MEQ/L-%-% IV SOLN
INTRAVENOUS | Status: DC
  Filled 2019-11-14 (×3): qty 1000

## 2019-11-14 MED ORDER — BUPIVACAINE IN DEXTROSE 0.75-8.25 % IT SOLN
INTRATHECAL | Status: DC | PRN
  Administered 2019-11-14: 2 mL via INTRATHECAL

## 2019-11-14 MED ORDER — MENTHOL 3 MG MT LOZG: 1.0000 | LOZENGE | OROMUCOSAL | Status: DC | PRN

## 2019-11-14 MED ORDER — ONDANSETRON HCL 4 MG/2ML IJ SOLN
4.0000 mg | Freq: Four times a day (QID) | INTRAMUSCULAR | Status: DC | PRN
  Administered 2019-11-14 – 2019-11-21 (×4): 4 mg via INTRAVENOUS
  Filled 2019-11-14 (×5): qty 2

## 2019-11-14 MED ORDER — ISOPROPYL ALCOHOL 70 % SOLN
Status: DC | PRN
  Administered 2019-11-14: 1 via TOPICAL

## 2019-11-14 MED ORDER — WATER FOR IRRIGATION, STERILE IR SOLN
Status: DC | PRN
  Administered 2019-11-14: 1000 mL

## 2019-11-14 MED ORDER — ENOXAPARIN SODIUM 40 MG/0.4ML ~~LOC~~ SOLN: 40.0000 mg | SUBCUTANEOUS | Status: DC

## 2019-11-14 MED ORDER — HYDROCODONE-ACETAMINOPHEN 5-325 MG PO TABS
1.0000 | ORAL_TABLET | ORAL | Status: DC | PRN
  Administered 2019-11-14 – 2019-11-19 (×8): 1 via ORAL
  Administered 2019-11-19: 2 via ORAL
  Administered 2019-11-20 (×2): 1 via ORAL
  Administered 2019-11-20: 2 via ORAL
  Administered 2019-11-20 – 2019-11-22 (×4): 1 via ORAL
  Filled 2019-11-14 (×11): qty 1
  Filled 2019-11-14: qty 2
  Filled 2019-11-14 (×3): qty 1
  Filled 2019-11-14: qty 2

## 2019-11-14 MED ORDER — 0.9 % SODIUM CHLORIDE (POUR BTL) OPTIME
TOPICAL | Status: DC | PRN
  Administered 2019-11-14: 1000 mL

## 2019-11-14 MED ORDER — ACETAMINOPHEN 325 MG PO TABS
325.0000 mg | ORAL_TABLET | Freq: Four times a day (QID) | ORAL | Status: DC | PRN
  Administered 2019-11-16: 650 mg via ORAL
  Filled 2019-11-14: qty 1
  Filled 2019-11-14: qty 2

## 2019-11-14 SURGICAL SUPPLY — 65 items
ADH SKN CLS APL DERMABOND .7 (GAUZE/BANDAGES/DRESSINGS) ×2
AML 16.5 STD 6.3 5/8 STD 12/1 (Hips) ×2 IMPLANT
APL PRP STRL LF DISP 70% ISPRP (MISCELLANEOUS) ×1
BAG DECANTER FOR FLEXI CONT (MISCELLANEOUS) IMPLANT
BAG SPEC THK2 15X12 ZIP CLS (MISCELLANEOUS)
BAG ZIPLOCK 12X15 (MISCELLANEOUS) IMPLANT
BLADE SURG SZ10 CARB STEEL (BLADE) IMPLANT
CHLORAPREP W/TINT 26 (MISCELLANEOUS) ×2 IMPLANT
COVER PERINEAL POST (MISCELLANEOUS) ×2 IMPLANT
COVER SURGICAL LIGHT HANDLE (MISCELLANEOUS) ×2 IMPLANT
COVER WAND RF STERILE (DRAPES) IMPLANT
DECANTER SPIKE VIAL GLASS SM (MISCELLANEOUS) ×2 IMPLANT
DERMABOND ADVANCED (GAUZE/BANDAGES/DRESSINGS) ×2
DERMABOND ADVANCED .7 DNX12 (GAUZE/BANDAGES/DRESSINGS) ×2 IMPLANT
DRAPE IMP U-DRAPE 54X76 (DRAPES) ×2 IMPLANT
DRAPE SHEET LG 3/4 BI-LAMINATE (DRAPES) ×6 IMPLANT
DRAPE STERI IOBAN 125X83 (DRAPES) IMPLANT
DRAPE U-SHAPE 47X51 STRL (DRAPES) ×4 IMPLANT
DRSG AQUACEL AG ADV 3.5X10 (GAUZE/BANDAGES/DRESSINGS) ×2 IMPLANT
ELECT REM PT RETURN 15FT ADLT (MISCELLANEOUS) ×2 IMPLANT
GAUZE SPONGE 4X4 12PLY STRL (GAUZE/BANDAGES/DRESSINGS) ×2 IMPLANT
GLOVE BIO SURGEON STRL SZ7.5 (GLOVE) ×2 IMPLANT
GLOVE BIO SURGEON STRL SZ8.5 (GLOVE) ×4 IMPLANT
GLOVE BIOGEL PI IND STRL 7.5 (GLOVE) ×1 IMPLANT
GLOVE BIOGEL PI IND STRL 8.5 (GLOVE) ×1 IMPLANT
GLOVE BIOGEL PI INDICATOR 7.5 (GLOVE) ×1
GLOVE BIOGEL PI INDICATOR 8.5 (GLOVE) ×1
GOWN SPEC L3 XXLG W/TWL (GOWN DISPOSABLE) ×2 IMPLANT
GOWN STRL REUS W/ TWL LRG LVL3 (GOWN DISPOSABLE) ×1 IMPLANT
GOWN STRL REUS W/TWL LRG LVL3 (GOWN DISPOSABLE) ×2
HANDPIECE INTERPULSE COAX TIP (DISPOSABLE) ×2
HEAD FEM UNIPOLAR 47 OD STRL (Hips) ×2 IMPLANT
HIP AML 16.5 STD 6.3 5/8STD2/1 (Hips) ×1 IMPLANT
HOLDER FOLEY CATH W/STRAP (MISCELLANEOUS) ×2 IMPLANT
HOOD PEEL AWAY FLYTE STAYCOOL (MISCELLANEOUS) ×8 IMPLANT
JET LAVAGE IRRISEPT WOUND (IRRIGATION / IRRIGATOR) ×2
KIT TURNOVER KIT A (KITS) IMPLANT
LAVAGE JET IRRISEPT WOUND (IRRIGATION / IRRIGATOR) ×1 IMPLANT
MANIFOLD NEPTUNE II (INSTRUMENTS) ×2 IMPLANT
MARKER SKIN DUAL TIP RULER LAB (MISCELLANEOUS) ×2 IMPLANT
NDL SAFETY ECLIPSE 18X1.5 (NEEDLE) ×1 IMPLANT
NEEDLE HYPO 18GX1.5 SHARP (NEEDLE) ×2
NEEDLE SPNL 18GX3.5 QUINCKE PK (NEEDLE) ×2 IMPLANT
PACK ANTERIOR HIP CUSTOM (KITS) ×2 IMPLANT
PENCIL SMOKE EVACUATOR (MISCELLANEOUS) IMPLANT
SAW OSC TIP CART 19.5X105X1.3 (SAW) ×2 IMPLANT
SEALER BIPOLAR AQUA 6.0 (INSTRUMENTS) ×2 IMPLANT
SET DALL MILES CABLE SL 1.6 (MISCELLANEOUS) ×2 IMPLANT
SET HNDPC FAN SPRY TIP SCT (DISPOSABLE) ×1 IMPLANT
SPACER DEPUY (Hips) ×2 IMPLANT
STAPLER VISISTAT 35W (STAPLE) ×2 IMPLANT
SUT ETHIBOND NAB CT1 #1 30IN (SUTURE) ×4 IMPLANT
SUT MNCRL AB 3-0 PS2 18 (SUTURE) ×2 IMPLANT
SUT MNCRL AB 4-0 PS2 18 (SUTURE) ×2 IMPLANT
SUT MON AB 2-0 CT1 36 (SUTURE) ×4 IMPLANT
SUT STRATAFIX PDO 1 14 VIOLET (SUTURE) ×2
SUT STRATFX PDO 1 14 VIOLET (SUTURE) ×1
SUT VIC AB 2-0 CT1 27 (SUTURE) ×2
SUT VIC AB 2-0 CT1 TAPERPNT 27 (SUTURE) ×1 IMPLANT
SUTURE STRATFX PDO 1 14 VIOLET (SUTURE) ×1 IMPLANT
SYR 3ML LL SCALE MARK (SYRINGE) ×2 IMPLANT
TRAY FOLEY MTR SLVR 16FR STAT (SET/KITS/TRAYS/PACK) IMPLANT
WATER STERILE IRR 1000ML POUR (IV SOLUTION) ×2 IMPLANT
YANKAUER SUCT BULB TIP 10FT TU (MISCELLANEOUS) ×2 IMPLANT
dall miles ×2 IMPLANT

## 2019-11-14 NOTE — Consult Note (Signed)
Reason for Consult:Left hip fx Referring Physician: A Feliz [name] [name] is an 86 y.o. female.  HPI: [name] [name] fell at home this morning trying to pick up [name] trash can. She had immediate left hip pain and could not get up. She was brought to the ED where x-rays showed [name] femoral neck fx and orthopedic surgery was consulted. She c/o pain localized to the area. She lives at home alone.  Past Medical History:  Diagnosis Date  . Arthritis   . Hip fracture [facility]) 2009   surgery  . History of depression   . History of gallstones   . History of IBS   . History of vertigo   . Hyperlipidemia   . Hypertension   . RBBB (right bundle branch block)     Past Surgical History:  Procedure Laterality Date  . CHOLECYSTECTOMY  2008   Dr. Harlow Asa  . [place]   hysterectomy  . TONSILECTOMY, ADENOIDECTOMY, BILATERAL MYRINGOTOMY AND TUBES     age 46 tonsils and adnoids only  . TOTAL HIP ARTHROPLASTY  02/20/2008   fractured hip    Family History  Problem Relation Age of Onset  . Tuberculosis Mother        very young age  . Other Father        suicide  . Colon cancer Sister        colon  . Kidney disease Brother   . Heart disease Brother   . Colon polyps Neg Hx   . Diabetes Neg Hx   . Esophageal cancer Neg Hx     Social History:  reports that she has never smoked. She has never used smokeless tobacco. She reports that she does not drink alcohol and does not use drugs.  Allergies:  Allergies  Allergen Reactions  . Codeine Nausea And Vomiting  . Crestor [Rosuvastatin Calcium]     myalgias  . Escitalopram Oxalate     Abdominal problem  . Ezetimibe-Simvastatin     myalgias    Medications: I have reviewed the patient's current medications.  Results for orders placed or performed during the hospital encounter of 11/14/19 (from the past 48 hour(s))  CBC with Differential/Platelet     Status: Abnormal   Collection Time: 11/14/19  6:55 AM  Result Value Ref Range    WBC 10.7 (H) 4.0 - 10.5 K/uL   RBC 4.45 3.87 - 5.11 MIL/uL   Hemoglobin 13.1 12.0 - 15.0 g/dL   HCT 40.8 36 - 46 %   MCV 91.7 80.0 - 100.0 fL   MCH 29.4 26.0 - 34.0 pg   MCHC 32.1 30.0 - 36.0 g/dL   RDW 12.8 11.5 - 15.5 %   Platelets 272 150 - 400 K/uL   nRBC 0.0 0.0 - 0.2 %   Neutrophils Relative % 84 %   Neutro Abs 9.1 (H) 1.7 - 7.7 K/uL   Lymphocytes Relative 7 %   Lymphs Abs 0.7 0.7 - 4.0 K/uL   Monocytes Relative 7 %   Monocytes Absolute 0.8 0 - 1 K/uL   Eosinophils Relative 1 %   Eosinophils Absolute 0.1 0 - 0 K/uL   Basophils Relative 0 %   Basophils Absolute 0.0 0 - 0 K/uL   Immature Granulocytes 1 %   Abs Immature Granulocytes 0.05 0.00 - 0.07 K/uL    Comment: Performed at [facility], [place] [address], [place], [place] [address]  Basic metabolic panel     Status:  Abnormal   Collection Time: 11/14/19  6:55 AM  Result Value Ref Range   Sodium 139 135 - 145 mmol/L   Potassium 3.2 (L) 3.5 - 5.1 mmol/L   Chloride 107 98 - 111 mmol/L   CO2 23 22 - 32 mmol/L   Glucose, Bld 135 (H) 70 - 99 mg/dL    Comment: Glucose reference range applies only to samples taken after fasting for at least 8 hours.   BUN 10 8 - 23 mg/dL   Creatinine, Ser 0.48 0.44 - 1.00 mg/dL   Calcium 8.9 8.9 - 10.3 mg/dL   GFR calc non Af Amer >60 >60 mL/min   GFR calc Af Amer >60 >60 mL/min   Anion gap 9 5 - 15    Comment: Performed at [facility], [place] [address], [place], [place] [address]  Type and screen     Status: None   Collection Time: 11/14/19  6:55 AM  Result Value Ref Range   ABO/RH(D) B POS    Antibody Screen NEG    Sample Expiration      11/17/2019,2359 Performed at [facility], [place] [address] [place], [place] [address]   Magnesium     Status: None   Collection Time: 11/14/19  6:55 AM  Result Value Ref Range   Magnesium 1.9 1.7 - 2.4 mg/dL    Comment: Performed at [facility], [place] [address],  [place], [place] [address]  SARS Coronavirus 2 by RT PCR (hospital order, performed in [facility] hospital lab) Nasopharyngeal Nasopharyngeal Swab     Status: None   Collection Time: 11/14/19  7:44 AM   Specimen: Nasopharyngeal Swab  Result Value Ref Range   SARS Coronavirus 2 NEGATIVE NEGATIVE    Comment: (NOTE) SARS-CoV-2 target nucleic acids are NOT DETECTED.  The SARS-CoV-2 RNA is generally detectable in upper and lower respiratory specimens during the acute phase of infection. The lowest concentration of SARS-CoV-2 viral copies this assay can detect is 250 copies / mL. [name] negative result does not preclude SARS-CoV-2 infection and should not be used as the sole basis for treatment or other patient management decisions.  [name] negative result may occur with improper specimen collection / handling, submission of specimen other than nasopharyngeal swab, presence of viral mutation(s) within the areas targeted by this assay, and inadequate number of viral copies (<250 copies / mL). [name] negative result must be combined with clinical observations, patient history, and epidemiological information.  Fact Sheet for Patients:   StrictlyIdeas.no  Fact Sheet for Healthcare Providers: BankingDealers.co.za  This test is not yet approved or  cleared by the [place] FDA and has been authorized for detection and/or diagnosis of SARS-CoV-2 by FDA under an Emergency Use Authorization (EUA).  This EUA will remain in effect (meaning this test can be used) for the duration of the COVID-19 declaration under Section 564(b)(1) of the Act, 21 U.S.C. section 360bbb-3(b)(1), unless the authorization is terminated or revoked sooner.  Performed at [facility], [place] [address] [place], [place] [address]   Urinalysis, Routine w reflex microscopic     Status: Abnormal   Collection Time: 11/14/19  9:55 AM  Result Value Ref Range   Color, Urine  STRAW ([name]) YELLOW   APPearance CLEAR CLEAR   Specific Gravity, Urine 1.009 1.005 - 1.030   pH 8.0 5.0 - 8.0   Glucose, UA NEGATIVE NEGATIVE mg/dL   Hgb urine dipstick NEGATIVE NEGATIVE   Bilirubin Urine NEGATIVE NEGATIVE  Ketones, ur 5 ([name]) NEGATIVE mg/dL   Protein, ur NEGATIVE NEGATIVE mg/dL   Nitrite NEGATIVE NEGATIVE   Leukocytes,Ua NEGATIVE NEGATIVE    Comment: Performed at [place] [address] [place], [place] [address]    DG Chest 1 View  Result Date: 11/14/2019 CLINICAL DATA:  Fall.  Severe left hip pain. EXAM: CHEST  1 VIEW COMPARISON:  Chest x-ray dated January 26, 2010. FINDINGS: The heart size and mediastinal contours are within normal limits. Normal pulmonary vascularity. Insert elevation of the right hemidiaphragm. No acute osseous abnormality. 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 IMPRESSION: 1. No active disease. Electronically Signed   By: Titus Dubin M.D.   On: 11/14/2019 08:07   DG Tibia/Fibula Right  Result Date: 11/14/2019 CLINICAL DATA:  Suspected hip fracture.  RIGHT lower leg pain. EXAM: RIGHT TIBIA AND FIBULA - 2 VIEW COMPARISON:  None FINDINGS: Osteopenia. Degenerative changes about the RIGHT knee incidentally noted, incompletely imaged. No signs of acute fracture or dislocation. IMPRESSION: 1. No signs of acute fracture or dislocation. 2. Osteopenia and degenerative changes. Electronically Signed   By: Zetta Bills M.D.   On: 11/14/2019 08:01   DG Hip Unilat W or Wo Pelvis 2-3 Views Left  Result Date: 11/14/2019 CLINICAL DATA:  Suspected hip fracture patient from home with un witnessed fall complaining of severe LEFT hip pain EXAM: DG HIP (WITH OR WITHOUT PELVIS) 2-3V LEFT COMPARISON:  May 13, 2018 FINDINGS: Signs of displaced and angulated fracture with varus deformity of the LEFT femoral neck. Superior migration of the LEFT femur. Femoral head remains within the acetabulum. Evidence of  prior RIGHT femoral ORIF. Some anterior displacement of distal femur relative to femoral head on the cross-table lateral. Evidence of fracture, previous fracture of the RIGHT inferior pubic ramus. IMPRESSION: 1. Signs of displaced and angulated fracture of the LEFT femoral neck. 2. Prior RIGHT femoral ORIF. 3. Signs of prior fracture of the RIGHT inferior pubic ramus. Electronically Signed   By: Zetta Bills M.D.   On: 11/14/2019 07:52    Review of Systems  HENT: Negative for ear discharge, ear pain, hearing loss and tinnitus.   Eyes: Negative for photophobia and pain.  Respiratory: Negative for cough and shortness of breath.   Cardiovascular: Negative for chest pain.  Gastrointestinal: Negative for abdominal pain, nausea and vomiting.  Genitourinary: Negative for dysuria, flank pain, frequency and urgency.  Musculoskeletal: Positive for arthralgias (Left hip ). Negative for back pain, myalgias and neck pain.  Neurological: Negative for dizziness and headaches.  Hematological: Does not bruise/bleed easily.  Psychiatric/Behavioral: The patient is not nervous/anxious.    Blood pressure (!) 162/102, pulse (!) 105, temperature 98.1 F (36.7 C), temperature source Oral, resp. rate (!) 39, height 5' (1.524 m), weight 45 kg, SpO2 100 %. Physical Exam Constitutional:      General: She is not in acute distress.    Appearance: She is well-developed. She is not diaphoretic.  HENT:     Head: Normocephalic and atraumatic.  Eyes:     General: No scleral icterus.       Right eye: No discharge.        Left eye: No discharge.     Conjunctiva/sclera: Conjunctivae normal.  Cardiovascular:     Rate and Rhythm: Normal rate and regular rhythm.  Pulmonary:     Effort: Pulmonary effort is normal. No respiratory distress.  Musculoskeletal:     Cervical back: Normal range of motion.  Comments: LLE No traumatic wounds, ecchymosis, or rash  Mod TTP hip  No knee or ankle effusion  Knee stable to varus/  valgus and anterior/posterior stress  Sens DPN, SPN, TN grossly intact  Motor EHL, ext, flex, evers grossly intact  DP 1+, PT 1+, No significant edema  Skin:    General: Skin is warm and dry.  Neurological:     Mental Status: She is alert.  Psychiatric:        Behavior: Behavior normal.     Assessment/Plan: Left hip fx -- Plan left hip hemi by Dr. Lyla Glassing today. Please keep NPO. Multiple medical problems including HTN and HLD -- per primary service    Lisette Abu, PA-C Orthopedic Surgery [phone] 11/14/2019, 10:46 AM

## 2019-11-14 NOTE — Anesthesia Preprocedure Evaluation (Addendum)
Anesthesia Evaluation  Patient identified by MRN, date of birth, ID band Patient awake    Reviewed: Allergy & Precautions, NPO status , Patient's Chart, lab work & pertinent test results  Airway Mallampati: I  TM Distance: >3 FB Neck ROM: Full    Dental  (+) Edentulous Lower, Edentulous Upper   Pulmonary neg pulmonary ROS,    Pulmonary exam normal breath sounds clear to auscultation       Cardiovascular hypertension, Pt. on medications Normal cardiovascular exam Rhythm:Regular Rate:Normal     Neuro/Psych  Headaches, PSYCHIATRIC DISORDERS Anxiety    GI/Hepatic negative GI ROS, Neg liver ROS,   Endo/Other  negative endocrine ROS  Renal/GU negative Renal ROS  negative genitourinary   Musculoskeletal  (+) Arthritis , Osteoarthritis,  Left hip fx   Abdominal   Peds  Hematology negative hematology ROS (+) hct 40.8, plt 272   Anesthesia Other Findings   Reproductive/Obstetrics negative OB ROS                          Anesthesia Physical Anesthesia Plan  ASA: III  Anesthesia Plan: MAC and Spinal   Post-op Pain Management:    Induction:   PONV Risk Score and Plan: 2 and Propofol infusion and TIVA  Airway Management Planned: Natural Airway and Simple Face Mask  Additional Equipment: None  Intra-op Plan:   Post-operative Plan:   Informed Consent: I have reviewed the patients History and Physical, chart, labs and discussed the procedure including the risks, benefits and alternatives for the proposed anesthesia with the patient or authorized representative who has indicated his/her understanding and acceptance.       Plan Discussed with: CRNA  Anesthesia Plan Comments:        Anesthesia Quick Evaluation

## 2019-11-14 NOTE — H&P (Signed)
History and Physical  [name] [name] JME:[id number] DOB: [birth year] DOA: 11/14/2019  PCP: Lajean Manes, MD Patient coming from: Home  I have personally briefly reviewed patient's old medical records in [place]   Chief Complaint: fall and left hip pain  HPI: [name] [name] is a 86 y.o. female past medical history of right hip fracture status post repair more than 10 years ago, hypertension hyperlipidemia lives at her house with assistance she relates she thinks she tripped and fell and landed on her left hip after that she started having severe left-sided pain worse with movement.  She denies any prodromal symptoms.  Has any chest pain shortness of breath nausea vomiting, diarrhea or fever.  In the ED: Found tachycardic normotensive satting 100% on room air, the medic with a white count of 10.7 with a left shift a left hip displaced fracture.   Review of Systems: All systems reviewed and apart from history of presenting illness, are negative.  Past Medical History:  Diagnosis Date  . Arthritis   . Hip fracture [facility]) 2009   surgery  . History of depression   . History of gallstones   . History of IBS   . History of vertigo   . Hyperlipidemia   . Hypertension   . RBBB (right bundle branch block)    Past Surgical History:  Procedure Laterality Date  . CHOLECYSTECTOMY  2008   Dr. Harlow Asa  . [place]   hysterectomy  . TONSILECTOMY, ADENOIDECTOMY, BILATERAL MYRINGOTOMY AND TUBES     age 35 tonsils and adnoids only  . TOTAL HIP ARTHROPLASTY  02/20/2008   fractured hip   Social History:  reports that she has never smoked. She has never used smokeless tobacco. She reports that she does not drink alcohol and does not use drugs.   Allergies  Allergen Reactions  . Codeine Nausea And Vomiting  . Crestor [Rosuvastatin Calcium]     myalgias  . Escitalopram Oxalate     Abdominal problem  . Ezetimibe-Simvastatin     myalgias    Family History    Problem Relation Age of Onset  . Tuberculosis Mother        very young age  . Other Father        suicide  . Colon cancer Sister        colon  . Kidney disease Brother   . Heart disease Brother   . Colon polyps Neg Hx   . Diabetes Neg Hx   . Esophageal cancer Neg Hx     Prior to Admission medications   Medication Sig Start Date End Date Taking? Authorizing Provider  acetaminophen (TYLENOL) 500 MG tablet Take 500 mg by mouth every 6 (six) hours as needed for moderate pain.   Yes [provider]  amLODipine (NORVASC) 2.5 MG tablet Take 2.5 mg by mouth daily. 10/19/19  Yes [provider]  HYDROcodone-acetaminophen (NORCO/VICODIN) 5-325 MG tablet Take 2 tablets by mouth every 6 (six) hours as needed for pain. 10/25/19  Yes [provider]  ondansetron (ZOFRAN) 4 MG tablet Take 4 mg by mouth 2 (two) times daily as needed for nausea/vomiting. 10/12/19  Yes [provider]  diltiazem (CARDIZEM CD) 120 MG 24 hr capsule Take 1 capsule (120 mg total) by mouth daily. Patient not taking: Reported on 05/13/2018 07/10/14 03/17/19  Rai, Vernelle Emerald, MD  esomeprazole (NEXIUM) 40 MG capsule Take 1 capsule (40 mg total) by mouth 2 (two) times  daily before a meal. Patient not taking: Reported on 05/13/2018 07/10/14 03/17/19  Rai, Vernelle Emerald, MD  metoCLOPramide (REGLAN) 5 MG tablet Take 1 tablet (5 mg total) by mouth 3 (three) times daily with meals as needed for nausea (take 34mins before meals). Patient not taking: Reported on 05/13/2018 07/10/14 03/17/19  Rai, Vernelle Emerald, MD  promethazine (PHENERGAN) 12.5 MG tablet Take 1 tablet (12.5 mg total) by mouth every 6 (six) hours as needed for nausea or vomiting. Patient not taking: Reported on 05/13/2018 07/10/14 03/17/19  Mendel Corning, MD   Physical Exam: Vitals:   11/14/19 0639 11/14/19 0645 11/14/19 0700  BP: (!) 161/95  (!) 132/99  Pulse: (!) 105  98  Resp: 15  (!) 25  Temp: 98.1 F (36.7 C)    TempSrc: Oral    SpO2:  97%  100%  Weight:  45 kg   Height:  5' (1.524 m)      General exam: Moderately built and nourished patient, lying comfortably supine on the gurney in no obvious distress.  Head, eyes and ENT: Nontraumatic and normocephalic.   Neck: Supple. No JVD, carotid bruit or thyromegaly.  Lymphatics: No lymphadenopathy.  Respiratory system: Clear to auscultation. No increased work of breathing.  Cardiovascular system: S1 and S2 heard, RRR. No JVD, murmurs, gallops, clicks or pedal edema.  Gastrointestinal system: Abdomen is nondistended, soft and nontender. Normal bowel sounds heard.   Central nervous system: Alert and oriented. No focal neurological deficits.  Extremities: Her left lower extremity is shorter than the right,  Skin: She has a skin tear with partial small tear on her left calf  Musculoskeletal system: Negative exam.  Psychiatry: Pleasant and cooperative.   Labs on Admission:  Basic Metabolic Panel: Recent Labs  Lab 11/14/19 0655  NA 139  K 3.2*  CL 107  CO2 23  GLUCOSE 135*  BUN 10  CREATININE 0.48  CALCIUM 8.9   Liver Function Tests: No results for input(s): AST, ALT, ALKPHOS, BILITOT, PROT, ALBUMIN in the last 168 hours. No results for input(s): LIPASE, AMYLASE in the last 168 hours. No results for input(s): AMMONIA in the last 168 hours. CBC: Recent Labs  Lab 11/14/19 0655  WBC 10.7*  NEUTROABS 9.1*  HGB 13.1  HCT 40.8  MCV 91.7  PLT 272   Cardiac Enzymes: No results for input(s): CKTOTAL, CKMB, CKMBINDEX, TROPONINI in the last 168 hours.  BNP (last 3 results) No results for input(s): PROBNP in the last 8760 hours. CBG: No results for input(s): GLUCAP in the last 168 hours.  Radiological Exams on Admission: DG Chest 1 View  Result Date: 11/14/2019 CLINICAL DATA:  Fall.  Severe left hip pain. EXAM: CHEST  1 VIEW COMPARISON:  Chest x-ray dated January 26, 2010. FINDINGS: The heart size and mediastinal contours are within normal limits.  Normal pulmonary vascularity. Insert elevation of the right hemidiaphragm. No acute osseous abnormality. 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 IMPRESSION: 1. No active disease. Electronically Signed   By: Titus Dubin M.D.   On: 11/14/2019 08:07   DG Tibia/Fibula Right  Result Date: 11/14/2019 CLINICAL DATA:  Suspected hip fracture.  RIGHT lower leg pain. EXAM: RIGHT TIBIA AND FIBULA - 2 VIEW COMPARISON:  None FINDINGS: Osteopenia. Degenerative changes about the RIGHT knee incidentally noted, incompletely imaged. No signs of acute fracture or dislocation. IMPRESSION: 1. No signs of acute fracture or dislocation. 2. Osteopenia and degenerative changes. Electronically Signed   By: Cay Schillings  Wile M.D.   On: 11/14/2019 08:01   DG Hip Unilat W or Wo Pelvis 2-3 Views Left  Result Date: 11/14/2019 CLINICAL DATA:  Suspected hip fracture patient from home with un witnessed fall complaining of severe LEFT hip pain EXAM: DG HIP (WITH OR WITHOUT PELVIS) 2-3V LEFT COMPARISON:  May 13, 2018 FINDINGS: Signs of displaced and angulated fracture with varus deformity of the LEFT femoral neck. Superior migration of the LEFT femur. Femoral head remains within the acetabulum. Evidence of prior RIGHT femoral ORIF. Some anterior displacement of distal femur relative to femoral head on the cross-table lateral. Evidence of fracture, previous fracture of the RIGHT inferior pubic ramus. IMPRESSION: 1. Signs of displaced and angulated fracture of the LEFT femoral neck. 2. Prior RIGHT femoral ORIF. 3. Signs of prior fracture of the RIGHT inferior pubic ramus. Electronically Signed   By: Zetta Bills M.D.   On: 11/14/2019 07:52    EKG: Independently reviewed. SR normal axis right bundle branch block QTC is prolonged nonspecific T wave changes  Assessment/Plan Closed left hip fracture, initial encounter (HCC)/left calf skin tear: Likely due to mechanical fall she relates no  prodromal symptoms. Left hip x-ray showed a displaced angulated fracture of the left femoral neck. Orthopedic surgery has been consulted and they would like to proceed with surgery today. Keep the patient n.p.o. Orthopedic surgery to manage left calf skin tear Use narcotic and Robaxin for pain control start her on MiraLAX. She is at high risk of aspiration pneumonia and delirium have explained this to the family, will start on Haldol IV as needed after her QTC has been corrected.  Preop evaluation: She did go up a flight of stairs without being short of breath, this makes her a moderate risk for cardiopulmonary complications, I explained this to the family and they would like to proceed with surgery.  Hypokalemia Replete  potassium, check magnesium and replete.  Recheck in the morning.    Prolonged QTC: Likely due to hypokalemia and possibly to hypomagnesemia check magnesium, replete potassium try to keep potassium greater than 4, magnesium greater than 2.  Leukocytosis: Reactive likely due to fracture.   Essential hypertension: Can resume Norvasc in the morning.   DVT Prophylaxis: lovenox Code Status: DNR Family Communication: daughter  Disposition Plan: inpatient    It is my clinical opinion that admission to INPATIENT is reasonable and necessary in this 86 y.o. female past medical history of hypertension that comes in for a left femoral neck fracture orthopedic surgery was consulted and recommended surgical intervention.  Given the aforementioned, the predictability of an adverse outcome is felt to be significant. I expect that the patient will require at least 2 midnights in the hospital to treat this condition.  Charlynne Cousins MD [facility]   11/14/2019, 8:17 AM

## 2019-11-14 NOTE — ED Triage Notes (Signed)
Pt presents from home via [facility], pt had an unwitnessed fall, skin tears noted to R lower leg and L leg shortening, given 250 fentanyl and 4mg  zofran PTA, GCS 15, hx recent lumbar fx

## 2019-11-14 NOTE — Consult Note (Addendum)
Consultation Note Date: 11/14/2019   Patient Name: [name] [name]  DOB: December 13, 1922  MRN: [id number]  Age / Sex: 86 y.o., female  PCP: Lajean Manes, MD Referring Physician: Aileen Fass, Tammi Klippel, MD  Reason for Consultation: Establishing goals of care  HPI/Patient Profile: 86 y.o. female  with past medical history of arthritis, hip fracture, depression, gallstones, IBS, vertigo, hyperlipidemia, hypertension, RBBB admitted from home on 11/14/2019 with unwitnessed fall and left hip pain with fracture. Also with right lower leg hematoma. Per notes there are plans to pursue surgical intervention.   Clinical Assessment and Goals of Care: I met today with Ms. Nanez' daughter-in-law while Ms. Kissel is being prepared for surgery. Ms. Clinger is alert and oriented although very hard of hearing. Daughter-in-law explains that Ms. Kuhner had 3 children but 2 have died and 1 is in a facility with mental illness. She is her next of kin. She has been looking out for Ms. Newburg and has added caregivers for 2.5 hours in the morning and again in the evening to assist. Ms. Winiarski can ambulate and get around home by herself but does not bath and meals are taken care of. Daughter-in-law visits with her and checks on her every other day. She is overall very independent and has done well up until compression fractures have made her more limited over the past few months so caregivers have been added. She describes Ms. Troeger as "very strubborn" which usually means that she will do well with current situation.   We further discussed goals of care and DNR is confirmed. Daughter-in-law shares that Ms. Vanderheyden has told her "the only way I will leave my home is feet first." She would be open to rehab [facility] would be preference since daughter-in-law works there and she has been there before) but would not want to live in a facility and wishes to be at home. Goals  would align to interventions to improve her QOL and maintain her life at home. Sounds like caregivers can be increased at home if indicated.   All questions/concerns addressed. Emotional support provided.   Primary Decision Maker PATIENT    SUMMARY OF RECOMMENDATIONS   - DNR confirmed - Interventions aligned with improving QOL are desired - Open to rehab stay  Code Status/Advance Care Planning:  DNR   Symptom Management:   Per attending and surgery.   Palliative Prophylaxis:   Bowel Regimen, Delirium Protocol, Frequent Pain Assessment and Turn Reposition  Prognosis:   Unable to determine  Discharge Planning: To Be Determined. Likely rehab stay with goal to return back home with caregiver support.       Primary Diagnoses: Present on Admission: . Closed left hip fracture, initial encounter ([place]) . (Resolved) Closed left hip fracture ([place])   I have reviewed the medical record, interviewed the patient and family, and examined the patient. The following aspects are pertinent.  Past Medical History:  Diagnosis Date  . Arthritis   . Hip fracture [facility]) 2009   surgery  . History of depression   .  History of gallstones   . History of IBS   . History of vertigo   . Hyperlipidemia   . Hypertension   . RBBB (right bundle branch block)    Social History   Socioeconomic History  . Marital status: Widowed    Spouse name: Not on file  . Number of children: 3  . Years of education: Not on file  . Highest education level: Not on file  Occupational History  . Occupation: Retired  Tobacco Use  . Smoking status: Never Smoker  . Smokeless tobacco: Never Used  Substance and Sexual Activity  . Alcohol use: No    Alcohol/week: 0.0 standard drinks  . Drug use: No  . Sexual activity: Not on file  Other Topics Concern  . Not on file  Social History Narrative  . Not on file   Social Determinants of Health   Financial Resource Strain:   . Difficulty of Paying Living  Expenses:   Food Insecurity:   . Worried About Charity fundraiser in the Last Year:   . Arboriculturist in the Last Year:   Transportation Needs:   . Film/video editor (Medical):   Marland Kitchen Lack of Transportation (Non-Medical):   Physical Activity:   . Days of Exercise per Week:   . Minutes of Exercise per Session:   Stress:   . Feeling of Stress :   Social Connections:   . Frequency of Communication with Friends and Family:   . Frequency of Social Gatherings with Friends and Family:   . Attends Religious Services:   . Active Member of Clubs or Organizations:   . Attends Archivist Meetings:   Marland Kitchen Marital Status:    Family History  Problem Relation Age of Onset  . Tuberculosis Mother        very young age  . Other Father        suicide  . Colon cancer Sister        colon  . Kidney disease Brother   . Heart disease Brother   . Colon polyps Neg Hx   . Diabetes Neg Hx   . Esophageal cancer Neg Hx    Scheduled Meds: Continuous Infusions: . magnesium sulfate bolus IVPB    . potassium chloride 10 mEq (11/14/19 0743)  . potassium chloride     PRN Meds:.morphine injection Allergies  Allergen Reactions  . Codeine Nausea And Vomiting  . Crestor [Rosuvastatin Calcium]     myalgias  . Escitalopram Oxalate     Abdominal problem  . Ezetimibe-Simvastatin     myalgias   Review of Systems  Constitutional: Positive for activity change.  Respiratory: Negative for shortness of breath.   Gastrointestinal: Negative for nausea.  Musculoskeletal: Positive for back pain.  Neurological: Positive for weakness.    Physical Exam Vitals and nursing note reviewed.  Constitutional:      General: She is not in acute distress.    Comments: Thin, frail, elderly  Cardiovascular:     Rate and Rhythm: Normal rate.  Pulmonary:     Effort: Pulmonary effort is normal. No tachypnea, accessory muscle usage or respiratory distress.  Abdominal:     General: Abdomen is flat.    Neurological:     Mental Status: She is alert and oriented to person, place, and time.     Comments: Hard of hearing     Vital Signs: BP (!) 132/99   Pulse 98   Temp 98.1 F (36.7 C) (Oral)  Resp (!) 25   Ht 5' (1.524 m)   Wt 45 kg   SpO2 100%   BMI 19.38 kg/m  Pain Scale: 0-10   Pain Score: 8    SpO2: SpO2: 100 % O2 Device:SpO2: 100 % O2 Flow Rate: .   IO: Intake/output summary: No intake or output data in the 24 hours ending 11/14/19 0850  LBM:   Baseline Weight: Weight: 45 kg Most recent weight: Weight: 45 kg     Palliative Assessment/Data:     Time In: 1030 Time Out: 1100 Time Total: 30 min Greater than 50%  of this time was spent counseling and coordinating care related to the above assessment and plan.  Signed by: Vinie Sill, NP Palliative Medicine Team Pager # [phone] (M-F 8a-5p) Team Phone # [phone] (Nights/Weekends)

## 2019-11-14 NOTE — Op Note (Signed)
OPERATIVE REPORT  SURGEON: Rod Can, MD   ASSISTANT: Cherlynn June, PA-C  PREOPERATIVE DIAGNOSIS: Displaced Left femoral neck fracture.   POSTOPERATIVE DIAGNOSIS: Displaced Left femoral neck fracture.   PROCEDURE: Left hip hemiarthroplasty, anterior approach.   IMPLANTS: DePuy AML stem, size 16.5, small staturet, with a -3 mm spacer and a 47 mm monopolar head ball. Dall Miles 1.6 mm adult reconstruction cable x1.  ANESTHESIA:  Spinal  ANTIBIOTICS: 2g ancef.  ESTIMATED BLOOD LOSS:-200 mL    DRAINS: None.  COMPLICATIONS: None   CONDITION: PACU - hemodynamically stable.   BRIEF CLINICAL NOTE: [name] [name] is a 86 y.o. female with a displaced Left femoral neck fracture. The patient was admitted to the hospitalist service and underwent perioperative risk stratification and medical optimization. The risks, benefits, and alternatives to hemiarthroplasty were explained, and the patient elected to proceed.  PROCEDURE IN DETAIL: The patient was taken to the operating room and spinal anesthesia was induced on the hospital bed.  The patient was then positioned on the Hana table.  All bony prominences were well padded.  The hip was prepped and draped in the normal sterile surgical fashion.  A time-out was called verifying side and site of surgery. Antibiotics were given within 60 minutes of beginning the procedure.   The direct anterior approach to the hip was performed through the Hueter interval.  Lateral femoral circumflex vessels were treated with the Auqumantys. The anterior capsule was exposed and an inverted T capsulotomy was made.  Fracture hematoma was encountered and evacuated. The patient was found to have a comminuted Left subcapital femoral neck fracture.  There was fracture extension into the calcar. I freshened the femoral neck cut with a saw.  I removed the femoral neck fragment.  A corkscrew was placed into the head and the head was removed.  This was passed to the  back table and was measured.   Acetabular exposure was achieved.  I examined the articular cartilage which was intact.  The labrum was intact. A 47 mm trial head was placed and found to have excellent fit.   I then gained femoral exposure taking care to protect the abductors and greater trochanter.  This was performed using standard external rotation, extension, and adduction.  The capsule was peeled off the inner aspect of the greater trochanter, taking care to preserve the short external rotators. I placed a single subperiosteal prophylactic reconstruction cable just proximal to the lesser trochanter. A cookie cutter was used to enter the femoral canal, and then the femoral canal finder was used to confirm location.  I then sequentially reamed up to a 16.0 mm reamer. I sequentially broached up to a size 16.5 small stature.  Calcar planer was used on the femoral neck remnant.  I paced a trial neck and a 36 + 1.5 head ball. The hip was reduced.  Leg lengths were checked fluoroscopically.  The hip was dislocated and trial components were removed.  I placed the real stem followed by the real spacer and head ball.  A single reduction maneuver was performed and the hip was reduced.  Fluoroscopy was used to confirm component position and leg lengths.  At 90 degrees of external rotation and extension, the hip was stable to an anterior directed force.   The wound was copiously irrigated with Irrisept solution and normal saline using pule lavage.  Marcaine solution was injected into the periarticular soft tissue.  The wound was closed in layers using #1 Vicryl and V-Loc for the  fascia, 2-0 Vicryl for the subcutaneous fat, 2-0 Monocryl for the deep dermal layer, and staples plus glue for the skin.  Once the glue was fully dried, an Aquacell Ag dressing was applied.  The patient was then awakened from anesthesia and transported to the recovery room in stable condition.  Sponge, needle, and instrument counts were correct  at the end of the case x2.  The patient tolerated the procedure well and there were no known complications.  Please note that a surgical assistant was a medical necessity for this procedure to perform it in a safe and expeditious manner. Assistant was necessary to provide appropriate retraction of vital neurovascular structures, to prevent femoral fracture, and to allow for anatomic placement of the prosthesis.

## 2019-11-14 NOTE — Anesthesia Postprocedure Evaluation (Signed)
Anesthesia Post Note  Patient: [name] [name]  Procedure(s) Performed: LEFT HIP HEMIARTHROPLASTY ANTERIOR APPROACH (Left Hip)     Patient location during evaluation: PACU Anesthesia Type: MAC and Spinal Level of consciousness: awake and alert, patient cooperative and oriented Pain management: pain level controlled Vital Signs Assessment: post-procedure vital signs reviewed and stable Respiratory status: spontaneous breathing, nonlabored ventilation and respiratory function stable Cardiovascular status: blood pressure returned to baseline and stable Postop Assessment: no apparent nausea or vomiting and spinal receding Anesthetic complications: no   No complications documented.  Last Vitals:  Vitals:   11/14/19 0904 11/14/19 1406  BP: (!) 162/102 120/65  Pulse: (!) 105 (!) 115  Resp: (!) 39 16  Temp:  36.6 C  SpO2: 100% 90%    Last Pain:  Vitals:   11/14/19 1406  TempSrc:   PainSc: 0-No pain                 Jazzmyn Filion,E. Tanielle Emigh

## 2019-11-14 NOTE — ED Provider Notes (Signed)
[place] DEPT Provider Note: Georgena Spurling, MD, FACEP  CSN: [id number] MRN: [id number] ARRIVAL: 11/14/19 at [place]: [place]  Hip Injury   HISTORY OF PRESENT ILLNESS  11/14/19 6:47 AM [name] [name] is a 86 y.o. female with a history of right hip fracture.  She had an unwitnessed fall at home earlier this morning.  She cannot explain why she fell, whether she slipped or lost her balance.  She is complaining of severe pain in her left hip, worse with movement.  She characterizes the pain as like previous hip fracture.  She also has skin tears and a hematoma to her right ankle.  She was given 4 mg of Zofran and 250 mcg of fentanyl IV prior to arrival with partial relief of her symptoms.  She denies neck pain.   Past Medical History:  Diagnosis Date  . Arthritis   . Hip fracture [facility]) 2009   surgery  . History of depression   . History of gallstones   . History of IBS   . History of vertigo   . Hyperlipidemia   . Hypertension   . RBBB (right bundle branch block)     Past Surgical History:  Procedure Laterality Date  . CHOLECYSTECTOMY  2008   Dr. Harlow Asa  . [place]   hysterectomy  . TONSILECTOMY, ADENOIDECTOMY, BILATERAL MYRINGOTOMY AND TUBES     age 9 tonsils and adnoids only  . TOTAL HIP ARTHROPLASTY  02/20/2008   fractured hip    Family History  Problem Relation Age of Onset  . Tuberculosis Mother        very young age  . Other Father        suicide  . Colon cancer Sister        colon  . Kidney disease Brother   . Heart disease Brother   . Colon polyps Neg Hx   . Diabetes Neg Hx   . Esophageal cancer Neg Hx     Social History   Tobacco Use  . Smoking status: Never Smoker  . Smokeless tobacco: Never Used  Substance Use Topics  . Alcohol use: No    Alcohol/week: 0.0 standard drinks  . Drug use: No    Prior to Admission medications   Medication Sig Start Date End Date Taking? Authorizing Provider    acetaminophen (TYLENOL) 500 MG tablet Take 500 mg by mouth every 6 (six) hours as needed for moderate pain.    [provider]  diltiazem (CARDIZEM CD) 120 MG 24 hr capsule Take 1 capsule (120 mg total) by mouth daily. Patient not taking: Reported on 05/13/2018 07/10/14 03/17/19  Mendel Corning, MD  esomeprazole (NEXIUM) 40 MG capsule Take 1 capsule (40 mg total) by mouth 2 (two) times daily before a meal. Patient not taking: Reported on 05/13/2018 07/10/14 03/17/19  Rai, Vernelle Emerald, MD  metoCLOPramide (REGLAN) 5 MG tablet Take 1 tablet (5 mg total) by mouth 3 (three) times daily with meals as needed for nausea (take 81mins before meals). Patient not taking: Reported on 05/13/2018 07/10/14 03/17/19  Rai, Vernelle Emerald, MD  promethazine (PHENERGAN) 12.5 MG tablet Take 1 tablet (12.5 mg total) by mouth every 6 (six) hours as needed for nausea or vomiting. Patient not taking: Reported on 05/13/2018 07/10/14 03/17/19  Mendel Corning, MD    Allergies Codeine, Crestor [rosuvastatin calcium], Escitalopram oxalate, and Ezetimibe-simvastatin   REVIEW OF SYSTEMS  Negative except as noted here or in  the History of Present Illness.   PHYSICAL EXAMINATION  Initial Vital Signs Blood pressure (!) 161/95, pulse (!) 105, temperature 98.1 F (36.7 C), temperature source Oral, resp. rate 15, height 5' (1.524 m), weight 45 kg, SpO2 97 %.  Examination General: Well-developed, cachectic female in no acute distress; appearance consistent with age of record HENT: normocephalic; atraumatic Eyes: pupils equal, round and reactive to light; extraocular muscles intact Neck: supple but range of motion limited due to arthritic changes; nontender Heart: regular rate and rhythm Lungs: clear to auscultation bilaterally Abdomen: soft; nondistended; nontender; bowel sounds present Extremities: Left hip tenderness with left lower extremity shortened and internally rotated; arthritic changes of feet with thickened  toenails; pulses normal; hematoma and skin tears right ankle:      Neurologic: Awake, alert and oriented; motor function intact in all extremities and symmetric; no facial droop Skin: Warm and dry Psychiatric: Normal mood and affect   RESULTS  Summary of this visit's results, reviewed and interpreted by myself:   EKG Interpretation  Date/Time:  Monday November 14 2019 06:50:05 EDT Ventricular Rate:  102 PR Interval:    QRS Duration: 138 QT Interval:  394 QTC Calculation: 514 R Axis:   -73 Text Interpretation: Sinus tachycardia Ventricular premature complex RBBB Baseline wander in lead(s) V1 Rate is faster Confirmed by Iokepa Geffre, Jenny Reichmann [phone]) on 11/14/2019 7:03:26 AM      Laboratory Studies: Results for orders placed or performed during the hospital encounter of 11/14/19 (from the past 24 hour(s))  CBC with Differential/Platelet     Status: Abnormal   Collection Time: 11/14/19  6:55 AM  Result Value Ref Range   WBC 10.7 (H) 4.0 - 10.5 K/uL   RBC 4.45 3.87 - 5.11 MIL/uL   Hemoglobin 13.1 12.0 - 15.0 g/dL   HCT 40.8 36 - 46 %   MCV 91.7 80.0 - 100.0 fL   MCH 29.4 26.0 - 34.0 pg   MCHC 32.1 30.0 - 36.0 g/dL   RDW 12.8 11.5 - 15.5 %   Platelets 272 150 - 400 K/uL   nRBC 0.0 0.0 - 0.2 %   Neutrophils Relative % 84 %   Neutro Abs 9.1 (H) 1.7 - 7.7 K/uL   Lymphocytes Relative 7 %   Lymphs Abs 0.7 0.7 - 4.0 K/uL   Monocytes Relative 7 %   Monocytes Absolute 0.8 0 - 1 K/uL   Eosinophils Relative 1 %   Eosinophils Absolute 0.1 0 - 0 K/uL   Basophils Relative 0 %   Basophils Absolute 0.0 0 - 0 K/uL   Immature Granulocytes 1 %   Abs Immature Granulocytes 0.05 0.00 - 0.07 K/uL  Basic metabolic panel     Status: Abnormal   Collection Time: 11/14/19  6:55 AM  Result Value Ref Range   Sodium 139 135 - 145 mmol/L   Potassium 3.2 (L) 3.5 - 5.1 mmol/L   Chloride 107 98 - 111 mmol/L   CO2 23 22 - 32 mmol/L   Glucose, Bld 135 (H) 70 - 99 mg/dL   BUN 10 8 - 23 mg/dL   Creatinine,  Ser 0.48 0.44 - 1.00 mg/dL   Calcium 8.9 8.9 - 10.3 mg/dL   GFR calc non Af Amer >60 >60 mL/min   GFR calc Af Amer >60 >60 mL/min   Anion gap 9 5 - 15   Imaging Studies: No results found.  ED COURSE and MDM  Nursing notes, initial and subsequent vitals signs, including pulse oximetry, reviewed and interpreted  by myself.  Vitals:   11/14/19 0639 11/14/19 0645 11/14/19 0700  BP: (!) 161/95  (!) 132/99  Pulse: (!) 105  98  Resp: 15  (!) 25  Temp: 98.1 F (36.7 C)    TempSrc: Oral    SpO2: 97%  100%  Weight:  45 kg   Height:  5' (1.524 m)    Medications  Tdap (BOOSTRIX) injection 0.5 mL (has no administration in time range)  morphine 2 MG/ML injection 2 mg (2 mg Intravenous Given 11/14/19 0704)  morphine 2 MG/ML injection 2 mg (has no administration in time range)  potassium chloride 10 mEq in 100 mL IVPB (has no administration in time range)   7:35 AM Dr. Onnie Graham of [facility] consulted for the patient's acute hip fracture.  We will have the hospitalist admit.  Dr. Maryan Rued made aware of the patient.    PROCEDURES  Procedures  CRITICAL CARE Performed by: Karen Chafe Shekera Beavers Total critical care time: 30 minutes Critical care time was exclusive of separately billable procedures and treating other patients. Critical care was necessary to treat or prevent imminent or life-threatening deterioration. Critical care was time spent personally by me on the following activities: development of treatment plan with patient and/or surrogate as well as nursing, discussions with consultants, evaluation of patient's response to treatment, examination of patient, obtaining history from patient or surrogate, ordering and performing treatments and interventions, ordering and review of laboratory studies, ordering and review of radiographic studies, pulse oximetry and re-evaluation of patient's condition.   ED DIAGNOSES     ICD-10-CM   1. Fall in home, initial encounter  W19.XXXA    Y92.009   2.  Closed displaced fracture of left femoral neck ([place])  S72.002A   3. Traumatic hematoma of right lower leg, initial encounter  S80.11XA   4. Skin tear of right lower leg without complication, initial encounter  S81.811A   5. Hypokalemia  E87.6        Emya Picado, Jenny Reichmann, MD 11/14/19 [phone]

## 2019-11-14 NOTE — Anesthesia Procedure Notes (Signed)
Spinal  Patient location during procedure: OR End time: 11/14/2019 11:28 AM Staffing Performed: anesthesiologist  Resident/CRNA: Noralyn Pick D, CRNA Preanesthetic Checklist Completed: patient identified, IV checked, site marked, risks and benefits discussed, surgical consent, monitors and equipment checked, pre-op evaluation and timeout performed Spinal Block Patient position: left lateral decubitus Prep: Betadine Patient monitoring: heart rate, continuous pulse ox and blood pressure Approach: midline Location: L3-4 Injection technique: single-shot Needle Needle type: Pencan  Needle gauge: 24 G Needle length: 9 cm Assessment Sensory level: T6 Additional Notes Expiration date of kit checked and confirmed. Patient tolerated procedure well, without complications.

## 2019-11-14 NOTE — Progress Notes (Signed)
Consult received for L femoral neck fx. Plan for surgery today, L hip hemiarthroplasty, around 11am. Full consult to follow. NPO. Hold chemical DVT ppx.  Also noted to have RLE skin tears. Recommend local wound care.

## 2019-11-14 NOTE — ED Provider Notes (Signed)
Patient was seen by Dr. Florina Ou last evening.  Patient was diagnosed with a hip fracture.  Patient is being admitted to the hospital and she is going to have surgery on her hip.  Nursing staff cleaned her wounds further and noticed that the right lower extremity wound appeared to be more than just a superficial skin tear.  I was asked to perform laceration repair prior to going to the operating room.  Exam  BP (!) 162/102   Pulse (!) 105   Temp 98.1 F (36.7 C) (Oral)   Resp (!) 39   Ht 1.524 m (5')   Wt 45 kg   SpO2 100%   BMI 19.38 kg/m   Physical Exam General: Patient is alert and awake Pulmonary: Patient is breathing easily Right lower extremity: Laceration noted on the medial aspect of her right lower leg does appear deeper through the subcutaneous tissue, muscle body is visible at the base of the wound ED Course/Procedures     .Marland KitchenLaceration Repair  Date/Time: 11/14/2019 10:27 AM Performed by: Dorie Rank, MD Authorized by: Dorie Rank, MD   Consent:    Consent obtained:  Verbal   Consent given by:  Patient and healthcare agent   Risks discussed:  Infection, need for additional repair, pain, poor cosmetic result and poor wound healing   Alternatives discussed:  No treatment and delayed treatment Universal protocol:    Procedure explained and questions answered to patient or proxy's satisfaction: yes     Relevant documents present and verified: yes     Test results available and properly labeled: yes     Imaging studies available: yes     Required blood products, implants, devices, and special equipment available: yes     Site/side marked: yes     Immediately prior to procedure, a time out was called: yes     Patient identity confirmed:  Verbally with patient Anesthesia (see MAR for exact dosages):    Anesthesia method:  Local infiltration   Local anesthetic:  Lidocaine 1% w/o epi Laceration details:    Location:  Leg   Depth (mm):  5 Repair type:    Repair type:   Simple Pre-procedure details:    Preparation:  Patient was prepped and draped in usual sterile fashion Exploration:    Wound extent: no foreign bodies/material noted, no muscle damage noted, no nerve damage noted, no tendon damage noted and no vascular damage noted     Contaminated: no   Treatment:    Area cleansed with:  Saline   Amount of cleaning:  Standard   Irrigation solution:  Sterile saline Skin repair:    Repair method:  Sutures   Suture size:  4-0   Suture material:  Prolene   Suture technique:  Simple interrupted   Number of sutures:  5 Approximation:    Approximation:  Loose Post-procedure details:    Dressing:  Antibiotic ointment   Patient tolerance of procedure:  Tolerated well, no immediate complications    MDM  Laceration was repaired without difficulty.  Patient's daughter was in the room during the procedure.  Pt transported to the OR after laceration repair.      Dorie Rank, MD 11/14/19 1028

## 2019-11-14 NOTE — Transfer of Care (Signed)
Immediate Anesthesia Transfer of Care Note  Patient: [name] [name]  Procedure(s) Performed: LEFT HIP HEMIARTHROPLASTY ANTERIOR APPROACH (Left Hip)  Patient Location: PACU  Anesthesia Type:Spinal  Level of Consciousness: awake, alert  and oriented  Airway & Oxygen Therapy: Patient Spontanous Breathing and Patient connected to face mask oxygen  Post-op Assessment: Report given to RN and Post -op Vital signs reviewed and stable  Post vital signs: Reviewed and stable  Last Vitals:  Vitals Value Taken Time  BP 120/65 11/14/19 1406  Temp    Pulse 116 11/14/19 1409  Resp 22 11/14/19 1409  SpO2 96 % 11/14/19 1409  Vitals shown include unvalidated device data.  Last Pain:  Vitals:   11/14/19 0736  TempSrc:   PainSc: 8          Complications: No complications documented.

## 2019-11-15 ENCOUNTER — Encounter (HOSPITAL_COMMUNITY): Payer: Self-pay | Admitting: Orthopedic Surgery

## 2019-11-15 LAB — BASIC METABOLIC PANEL
Anion gap: 7 (ref 5–15)
BUN: 10 mg/dL (ref 8–23)
CO2: 24 mmol/L (ref 22–32)
Calcium: 8.2 mg/dL — ABNORMAL LOW (ref 8.9–10.3)
Chloride: 104 mmol/L (ref 98–111)
Creatinine, Ser: 0.65 mg/dL (ref 0.44–1.00)
GFR calc Af Amer: >60 mL/min (ref >60)
GFR calc non Af Amer: >60 mL/min (ref >60)
Glucose, Bld: 166 mg/dL — ABNORMAL HIGH (ref 70–99)
Potassium: 4.4 mmol/L (ref 3.5–5.1)
Sodium: 135 mmol/L (ref 135–145)

## 2019-11-15 LAB — CBC
HCT: 30.1 % — ABNORMAL LOW (ref 36.0–46.0)
Hemoglobin: 9.4 g/dL — ABNORMAL LOW (ref 12.0–15.0)
MCH: 29.2 pg (ref 26.0–34.0)
MCHC: 31.2 g/dL (ref 30.0–36.0)
MCV: 93.5 fL (ref 80.0–100.0)
Platelets: 203 10*3/uL (ref 150–400)
RBC: 3.22 MIL/uL — ABNORMAL LOW (ref 3.87–5.11)
RDW: 12.7 % (ref 11.5–15.5)
WBC: 8.7 10*3/uL (ref 4.0–10.5)
nRBC: 0 % (ref 0.0–0.2)

## 2019-11-15 MED ORDER — ASPIRIN 81 MG PO CHEW
81.0000 mg | CHEWABLE_TABLET | Freq: Two times a day (BID) | ORAL | 0 refills | Status: DC
Start: 2019-11-15 — End: 2019-11-18

## 2019-11-15 MED ORDER — SODIUM CHLORIDE 0.9 % IV SOLN: Freq: Once | INTRAVENOUS | Status: AC

## 2019-11-15 MED ORDER — ACETAMINOPHEN 500 MG PO TABS
500.0000 mg | ORAL_TABLET | Freq: Four times a day (QID) | ORAL | Status: AC
  Administered 2019-11-15 – 2019-11-18 (×8): 500 mg via ORAL
  Filled 2019-11-15 (×8): qty 1

## 2019-11-15 MED ORDER — HYDROCODONE-ACETAMINOPHEN 5-325 MG PO TABS: 1.0000 | ORAL_TABLET | ORAL | 0 refills | Status: AC | PRN

## 2019-11-15 MED ORDER — POLYETHYLENE GLYCOL 3350 17 G PO PACK
17.0000 g | PACK | Freq: Every day | ORAL | Status: DC
  Administered 2019-11-15 – 2019-11-22 (×8): 17 g via ORAL
  Filled 2019-11-15 (×8): qty 1

## 2019-11-15 NOTE — Progress Notes (Signed)
Occupational Therapy Evaluation  Patient with functional deficits listed below impacting safety and independence with self care. Patient requiring mod A with bed mobility and mod A with functional transfer to recliner. Patient having difficulty using UEs on walker especially L side due to soreness from fall, pt having to slide R LE along floor vs being able to pick up to advance. Patient reports she has family/friend support from church but her DTR in law works and unsure of availability of 24/7 support at home. Patient also requiring increased assistance for functional transfers and ADLs therefore recommend D/C to venue listed below.    11/15/19 0900  OT Visit Information  Last OT Received On 11/15/19  Assistance Needed +1  History of Present Illness Patient is a 86 year old female admitted after fall resulting in L femoral neck fracture. Patient s/p L THA anterior approach. PMH include but not limited to right hip fracture status post repair more than 10 years ago, hypertension hyperlipidemia   Precautions  Precautions Fall;Anterior Hip  Restrictions  Weight Bearing Restrictions Yes  LLE Weight Bearing WBAT  Home Living  Family/patient expects to be discharged to: Private residence  Living Arrangements Alone  Available Help at Discharge Family;Friend(s);Available PRN/intermittently  Type of Home House  Home Access Stairs to enter  Entrance Stairs-Number of Steps 2  Entrance Stairs-Rails  (1 rail)  Home Layout Two level;Able to live on main level with bedroom/bathroom  Animal nutritionist - 2 wheels;Cane - single point;Shower seat;Grab bars - tub/shower;BSC  Additional Comments has 3 in 1 over her toilet at baseline  Prior Function  Level of Independence Independent with assistive device(s)  Comments patient reports was driving, doing her own laundry, cooking. does have a cleaning lady. nephew works at [employer] that gets  her groceries. has daughter in law and church friends that check in on her  Communication  Communication HOH  Pain Assessment  Pain Assessment 0-10  Pain Score 5  Pain Location L hip  Pain Descriptors / Indicators Grimacing;Sore  Pain Intervention(s) Monitored during session;Patient requesting pain meds-RN notified;Ice applied  Cognition  Arousal/Alertness Awake/alert  Behavior During Therapy WFL for tasks assessed/performed  Overall Cognitive Status Within Functional Limits for tasks assessed  Upper Extremity Assessment  Upper Extremity Assessment Generalized weakness  Lower Extremity Assessment  Lower Extremity Assessment Defer to PT evaluation  ADL  Overall ADL's  Needs assistance/impaired  Eating/Feeding Set up;Sitting  Grooming Set up;Sitting  Upper Body Bathing Set up;Sitting  Lower Body Bathing Maximal assistance;Sitting/lateral leans;Sit to/from stand  Upper Body Dressing  Set up;Sitting  Lower Body Dressing Maximal assistance;Sitting/lateral leans;Sit to/from stand  Lower Body Dressing Details (indicate cue type and reason) pain in L hip, leaning onto R elbow to minimize pressure  Toilet Transfer Moderate assistance;Cueing for safety;Cueing for sequencing;Stand-pivot;RW;BSC  Toilet Transfer Details (indicate cue type and reason) to recliner, having to slide R LE along floor due to pain in L hip with weight bearing, difficulty using UEs on walker due to shoulder pain from fall   Toileting- Clothing Manipulation and Hygiene Maximal assistance;Sitting/lateral lean;Sit to/from stand  Functional mobility during ADLs Moderate assistance;Cueing for safety;Cueing for sequencing;Rolling walker  General ADL Comments patient requiring increased assistance with self care due to pain, decreased balance, safety, activity tolerance, strength   Bed Mobility  Overal bed mobility Needs Assistance  Bed Mobility Supine to Sit  Supine to sit Mod assist  General bed mobility comments min A  to  mobilize L LE to EOB, mod A for trunk support and scooting hips to EOB  Transfers  Overall transfer level Needs assistance  Equipment used Rolling walker (2 wheeled)  Transfers Sit to/from [facility]  Sit to Stand Mod assist  Stand pivot transfers Mod assist  General transfer comment cues for body mechanics, patient unable to push with UE on EOB due to shoulder pain, mod A to power up to standing and for stability sliding R LE along floor to transfer to recliner,  Balance  Overall balance assessment Needs assistance  Sitting-balance support Bilateral upper extremity supported;Feet supported  Sitting balance-Leahy Scale Poor  Postural control Right lateral lean (due to pain in L hip)  Standing balance support Bilateral upper extremity supported;During functional activity  Standing balance-Leahy Scale Poor  Standing balance comment B UE support and mod A for safety to maintain  balance  OT - End of Session  Equipment Utilized During Treatment Rolling walker;Oxygen  Activity Tolerance Patient tolerated treatment well;Patient limited by pain  Patient left in chair;with call bell/phone within reach;with chair alarm set;with nursing/sitter in room  Nurse Communication Mobility status;Patient requests pain meds  OT Assessment  OT Recommendation/Assessment Patient needs continued OT Services  OT Visit Diagnosis Other abnormalities of gait and mobility (R26.89);History of falling (Z91.81);Muscle weakness (generalized) (M62.81);Pain  Pain - Right/Left Left  Pain - part of body Hip;Shoulder  OT Problem List Decreased strength;Decreased activity tolerance;Impaired balance (sitting and/or standing);Decreased safety awareness;Pain  OT Plan  OT Frequency (ACUTE ONLY) Min 2X/week  OT Treatment/Interventions (ACUTE ONLY) Self-care/ADL training;Therapeutic exercise;Energy conservation;DME and/or AE instruction;Therapeutic activities;Balance training;Patient/family education  AM-PAC OT  "6 Clicks" Daily Activity Outcome Measure (Version 2)  Help from another person eating meals? 3  Help from another person taking care of personal grooming? 3  Help from another person toileting, which includes using toliet, bedpan, or urinal? 2  Help from another person bathing (including washing, rinsing, drying)? 2  Help from another person to put on and taking off regular upper body clothing? 3  Help from another person to put on and taking off regular lower body clothing? 2  6 Click Score 15  OT Recommendation  Follow Up Recommendations SNF  OT Equipment Other (comment) (defer to next venue)  Individuals Consulted  Consulted and Agree with Results and Recommendations Patient  Acute Rehab OT Goals  Patient Stated Goal agreeable to get up to chair  OT Goal Formulation With patient  Time For Goal Achievement 11/29/19  Potential to Achieve Goals Good  OT Time Calculation  OT Start Time (ACUTE ONLY) 0824  OT Stop Time (ACUTE ONLY) 0859  OT Time Calculation (min) 35 min  OT General Charges  $OT Visit 1 Visit  OT Evaluation  $OT Eval Moderate Complexity 1 Mod  OT Treatments  $Self Care/Home Management  8-22 mins  Written Expression  Dominant Hand Right   Delbert Phenix OT OT pager: [phone]

## 2019-11-15 NOTE — Progress Notes (Signed)
Physical Therapy Treatment Patient Details Name: [name] [name] MRN: [id number] DOB: March 24, 1923 Today's Date: 11/15/2019    History of Present Illness 86 year old female admitted after fall resulting in L femoral neck fracture. Patient s/p L hip hemi direct anterior approach 11/14/19. PMH compression fx 07/2019, OA, vertigo, RBBB    PT Comments    Bed exercises only this afternoon. Pt c/o pain-made RN aware. Plan is for SNF.   Follow Up Recommendations  SNF     Equipment Recommendations  None recommended by PT    Recommendations for Other Services       Precautions / Restrictions Precautions Precautions: Fall Restrictions Weight Bearing Restrictions: No LLE Weight Bearing: Weight bearing as tolerated    Mobility  Bed Mobility               General bed mobility comments: exercises only this session-too much pain for OOB activity per pt  Transfers               Ambulation/Gait            Stairs             Wheelchair Mobility    Modified Rankin (Stroke Patients Only)       Balance Overall balance assessment: Needs assistance;History of Falls         Standing balance support: Bilateral upper extremity supported Standing balance-Leahy Scale: Poor                              Cognition Arousal/Alertness: Awake/alert Behavior During Therapy: WFL for tasks assessed/performed Overall Cognitive Status: Within Functional Limits for tasks assessed                                        Exercises General Exercises - Lower Extremity Ankle Circles/Pumps: AROM;Both;10 reps;Supine Quad Sets: AROM;Left;10 reps;Supine Heel Slides: AAROM;Left;5 reps;Supine Hip ABduction/ADduction: AAROM;Left;5 reps    General Comments        Pertinent Vitals/Pain Pain Assessment: Faces Faces Pain Scale: Hurts whole lot Pain Location: L hip Pain Descriptors / Indicators: Grimacing;Sore;Discomfort;Sharp;Aching Pain  Intervention(s): Ice applied;Limited activity within patient's tolerance;Monitored during session;Patient requesting pain meds-RN notified    Home Living Family/patient expects to be discharged to:: Unsure Living Arrangements: Alone Available Help at Discharge: Family;Friend(s);Available PRN/intermittently Type of Home: House Home Access: Stairs to enter Entrance Stairs-Rails: Right Home Layout: Two level;Able to live on main level with bedroom/bathroom Home Equipment: Gilford Rile - 2 wheels;Cane - single point;Shower seat;Grab bars - tub/shower;Bedside commode;Walker - 4 wheels      Prior Function Level of Independence: Independent with assistive device(s)      Comments: family/friends check on and provide transportation needed. Pt performs all other ADLs/IADLs.   PT Goals (current goals can now be found in the care plan section) Acute Rehab PT Goals Patient Stated Goal: Less pain. Regain independence PT Goal Formulation: With patient/family Time For Goal Achievement: 11/29/19 Potential to Achieve Goals: Fair Progress towards PT goals: Progressing toward goals    Frequency    Min 3X/week      PT Plan Current plan remains appropriate    Co-evaluation              AM-PAC PT "6 Clicks" Mobility   Outcome Measure  Help needed turning from your back to your side while in a flat  bed without using bedrails?: A Lot Help needed moving from lying on your back to sitting on the side of a flat bed without using bedrails?: A Lot Help needed moving to and from a bed to a chair (including a wheelchair)?: A Lot Help needed standing up from a chair using your arms (e.g., wheelchair or bedside chair)?: A Lot Help needed to walk in hospital room?: A Lot Help needed climbing 3-5 steps with a railing? : Total 6 Click Score: 11    End of Session Equipment Utilized During Treatment: Gait belt Activity Tolerance: Patient limited by fatigue;Patient limited by pain Patient left: in  bed;with call bell/phone within reach;with bed alarm set   PT Visit Diagnosis: Muscle weakness (generalized) (M62.81);History of falling (Z91.81);Other abnormalities of gait and mobility (R26.89);Pain Pain - Right/Left: Left Pain - part of body: Hip     Time: 2761-8485 PT Time Calculation (min) (ACUTE ONLY): 10 min  Charges:  $Therapeutic Exercise: 8-22 mins              Doreatha Massed, PT Acute Rehabilitation  Office: [phone] Pager: [phone]

## 2019-11-15 NOTE — Progress Notes (Signed)
Palliative:  HPI: 86 y.o. female  with past medical history of arthritis, hip fracture, depression, gallstones, IBS, vertigo, hyperlipidemia, hypertension, RBBB admitted from home on 11/14/2019 with unwitnessed fall and left hip pain with fracture. Also with right lower leg hematoma. S/P surgical repair left hip fracture.   I met today at [name] [name]'s bedside along with daughter-in-law, [name] [name]. [name] [name] is doing very well and sitting up in chair and trying to eat breakfast (although she is noted not to have eaten much). Communication limited as she is very hard of hearing. She has worked with OT this morning and has had very good pain control. She has no complaints. She verifies [name] [name] as her Doctor, hospital. They are hopeful for transition to short term rehab and already have 5 hours of caregivers daily and they say this can be increased if needed. There was a mix up in contact information so I clarified and adjusted contact information in facesheet as directed by [name] [name] and [name] [name]. I also changed to DNR status as this is her wishes as clarified by [name] [name] and consistent with previous orders (even back to 2016).   All questions/concerns addressed. Emotional support provided.   Exam: Thin, frail, elderly. Sitting up in recliner. Very hard of hearing. Breathing regular, unlabored. No pain. Moving all extremities.   Plan: - SNF rehab with hopes to return back home with private caregivers (5 hrs/daily currently).   15 min  Vinie Sill, NP Palliative Medicine Team Pager [phone] (Please see [email or web address] for schedule) Team Phone [phone]    Greater than 50%  of this time was spent counseling and coordinating care related to the above assessment and plan

## 2019-11-15 NOTE — TOC Initial Note (Signed)
Transition of Care [facility]) - Initial/Assessment Note    Patient Details  Name: [name] [name] MRN: [id number] Date of Birth: [birth year]  Transition of Care (TOC) CM/SW Contact:    Joaquin Courts, RN Phone Number: 11/15/2019, 2:14 PM  Clinical Narrative:  CM spoke with patient regarding recommendation for SNF for short term rehab.  Patient is in agreement with this plan.  FL2 faxed out to area facilities.                  Expected Discharge Plan: Skilled Nursing Facility Barriers to Discharge: Continued Medical Work up   Patient Goals and CMS Choice Patient states their goals for this hospitalization and ongoing recovery are:: to go to rehab CMS Medicare.gov Compare Post Acute Care list provided to:: Patient Choice offered to / list presented to : Patient  Expected Discharge Plan and Services Expected Discharge Plan: [place]   Discharge Planning Services: CM Consult Post Acute Care Choice: [place] Living arrangements for the past 2 months: Single Family Home                 DME Arranged: N/A DME Agency: NA       HH Arranged: NA HH Agency: NA        Prior Living Arrangements/Services Living arrangements for the past 2 months: [place] Lives with:: Self Patient language and need for interpreter reviewed:: Yes Do you feel safe going back to the place where you live?: Yes            Criminal Activity/Legal Involvement Pertinent to Current Situation/Hospitalization: No - Comment as needed  Activities of Daily Living Home Assistive Devices/Equipment: Cane (specify quad or straight), Grab bars in shower, Walker (specify type), Hearing aid, Shower chair with back, Other (Comment) (tub/shower unit, single point cane, front wheeled walker) ADL Screening (condition at time of admission) Patient's cognitive ability adequate to safely complete daily activities?: Yes Is the patient deaf or have difficulty hearing?: Yes (wears  bilateralhearing aids) Does the patient have difficulty seeing, even when wearing glasses/contacts?: No Does the patient have difficulty concentrating, remembering, or making decisions?: No Patient able to express need for assistance with ADLs?: Yes Does the patient have difficulty dressing or bathing?: Yes Independently performs ADLs?: No Communication: Independent Dressing (OT): Needs assistance Is this a change from baseline?: Change from baseline, expected to last >3 days Grooming: Needs assistance Is this a change from baseline?: Change from baseline, expected to last >3 days Feeding: Needs assistance Is this a change from baseline?: Change from baseline, expected to last >3 days Bathing: Needs assistance Is this a change from baseline?: Change from baseline, expected to last >3 days Toileting: Dependent Is this a change from baseline?: Change from baseline, expected to last >3days In/Out Bed: Dependent Is this a change from baseline?: Change from baseline, expected to last >3 days Walks in Home: Dependent Is this a change from baseline?: Change from baseline, expected to last >3 days Does the patient have difficulty walking or climbing stairs?: Yes (secondary to left hip pain) Weakness of Legs: Left Weakness of Arms/Hands: None  Permission Sought/Granted                  Emotional Assessment Appearance:: Appears stated age Attitude/Demeanor/Rapport: Engaged Affect (typically observed): Accepting Orientation: : Oriented to Self, Oriented to Place, Oriented to  Time, Oriented to Situation   Psych Involvement: No (comment)  Admission diagnosis:  Hypokalemia [E87.6] Closed left hip fracture ([place]) [  S72.002A] Closed left hip fracture, initial encounter ([place]) [S72.002A] Fall in home, initial encounter [W19.XXXA, Y92.009] Closed displaced fracture of left femoral neck ([facility]) [S72.002A] Skin tear of right lower leg without complication, initial encounter  [S81.811A] Traumatic hematoma of right lower leg, initial encounter [S80.11XA] Patient Active Problem List   Diagnosis Date Noted  . Closed left hip fracture, initial encounter ([place]) 11/14/2019  . Hypokalemia 11/14/2019  . Pre-op evaluation 11/14/2019  . Prolonged QT interval 11/14/2019  . Noninfected skin tear of lower extremity, left, initial encounter 11/14/2019  . Goals of care, counseling/discussion   . DNR (do not resuscitate)   . Palliative care by specialist   . Esophageal dysmotility 07/18/2014  . Anxiety state 07/10/2014  . Headache 07/07/2014  . Intractable vomiting with nausea 07/07/2014  . Dehydration 07/07/2014  . UTI (urinary tract infection) 07/07/2014  . History of depression   . History of gallstones   . Hip fracture ([place])   . Hip fracture ([place])   . History of vertigo   . Arthritis   . RBBB (right bundle branch block)   . History of IBS    PCP:  Lajean Manes, MD Pharmacy:   [place], [place] [address] [place] [address] Phone: [phone] Fax: [phone]     Social Determinants of Health (SDOH) Interventions    Readmission Risk Interventions No flowsheet data found.

## 2019-11-15 NOTE — Plan of Care (Signed)
  Problem: Education: Goal: Knowledge of General Education information will improve Description: Including pain rating scale, medication(s)/side effects and non-pharmacologic comfort measures Outcome: Progressing   Problem: Health Behavior/Discharge Planning: Goal: Ability to manage health-related needs will improve Outcome: Progressing   Problem: Pain Managment: Goal: General experience of comfort will improve Outcome: Progressing   

## 2019-11-15 NOTE — Progress Notes (Signed)
VAST consulted to obtain IV access. Initially patient was up in chair eating breakfast and has fluids running through a leaking PIV in her LAC. Returned to find IV in place, but fluids not running. DC'd IV with blood and fluid beneath dressing; cleaned arm and placed gauze over site.  Attempted 24g angiocath unsuccessfully. Attempted 22 g USGIV in pt upper left arm unsuccessfully.  Pt's right shoulder and arm extremely sore. Patient unable to lift arm completely and complaining of extreme pain.  Encouraged patient to sip/drink fluids as often as possible and advised we might be able to try again later.  Advised pt's nurse of unsuccessful attempts.

## 2019-11-15 NOTE — Evaluation (Signed)
Physical Therapy Evaluation Patient Details Name: [name] [name] MRN: [id number] DOB: 1922/07/01 Today's Date: 11/15/2019   History of Present Illness  86 year old female admitted after fall resulting in L femoral neck fracture. Patient s/p L hip hemi direct anterior approach 11/14/19. PMH compression fx 07/2019, OA, vertigo, RBBB  Clinical Impression  On eval, pt required Mod assist to stand and Min assist +2 for safety for ambulation. She walked ~15 feet with a RW. Mobility is limited by pain. Pt presents with general weakness, decreased activity tolerance, and impaired gait and balance. Discussed d/c plan with family-they are hopeful pt will agree to placement and that they can find a place to her liking. Will plan to follow pt during hospital stay and progress activity as tolerated.     Follow Up Recommendations SNF (HHPT and 24/7 supervision/assist if pt declines placement)    Equipment Recommendations  None recommended by PT    Recommendations for Other Services       Precautions / Restrictions Precautions Precautions: Fall Restrictions Weight Bearing Restrictions: No LLE Weight Bearing: Weight bearing as tolerated      Mobility  Bed Mobility Overal bed mobility: Needs Assistance Bed Mobility: Supine to Sit          General bed mobility comments: oob in recliner  Transfers Overall transfer level: Needs assistance Equipment used: Rolling walker (2 wheeled) Transfers: Sit to/from Stand Sit to Stand: Mod assist        General transfer comment: VCs safety, technique, hand placement. Increased time. Assist to rise, stabilize, control descent.  Ambulation/Gait Ambulation/Gait assistance: Min assist;+2 safety/equipment Gait Distance (Feet): 15 Feet Assistive device: Rolling walker (2 wheeled) Gait Pattern/deviations: Step-to pattern;Trunk flexed;Antalgic     General Gait Details: VCs safety, technique, sequence, RW proximity. Assist to stabilize/support pt  throughout distance. Followed closely with recliner  Stairs            Wheelchair Mobility    Modified Rankin (Stroke Patients Only)       Balance Overall balance assessment: Needs assistance;History of Falls Sitting-balance support: Bilateral upper extremity supported;Feet supported Sitting balance-Leahy Scale: Poor   Postural control: Right lateral lean (due to pain in L hip) Standing balance support: Bilateral upper extremity supported Standing balance-Leahy Scale: Poor Standing balance comment: B UE support and mod A for safety to maintain  balance                             Pertinent Vitals/Pain Pain Assessment: Faces Pain Score: 5  Faces Pain Scale: Hurts whole lot Pain Location: L hip Pain Descriptors / Indicators: Grimacing;Sore;Discomfort;Sharp;Aching Pain Intervention(s): Limited activity within patient's tolerance;Monitored during session;Repositioned    Home Living Family/patient expects to be discharged to:: Unsure Living Arrangements: Alone Available Help at Discharge: Family;Friend(s);Available PRN/intermittently Type of Home: House Home Access: Stairs to enter Entrance Stairs-Rails: Right Entrance Stairs-Number of Steps: 2 Home Layout: Two level;Able to live on main level with bedroom/bathroom Home Equipment: Gilford Rile - 2 wheels;Cane - single point;Shower seat;Grab bars - tub/shower;Bedside commode;Walker - 4 wheels Additional Comments: has 3 in 1 over her toilet at baseline    Prior Function Level of Independence: Independent with assistive device(s)         Comments: family/friends check on and provide transportation needed. Pt performs all other ADLs/IADLs.     Hand Dominance   Dominant Hand: Right    Extremity/Trunk Assessment   Upper Extremity Assessment Upper Extremity Assessment: Defer to  OT evaluation    Lower Extremity Assessment Lower Extremity Assessment: Generalized weakness    Cervical / Trunk  Assessment Cervical / Trunk Assessment: Kyphotic  Communication   Communication: HOH  Cognition Arousal/Alertness: Awake/alert Behavior During Therapy: WFL for tasks assessed/performed Overall Cognitive Status: Within Functional Limits for tasks assessed                                        General Comments      Exercises     Assessment/Plan    PT Assessment Patient needs continued PT services  PT Problem List Decreased strength;Decreased mobility;Decreased range of motion;Decreased activity tolerance;Decreased balance;Decreased knowledge of use of DME;Pain       PT Treatment Interventions DME instruction;Gait training;Therapeutic activities;Therapeutic exercise;Patient/family education;Balance training;Functional mobility training    PT Goals (Current goals can be found in the Care Plan section)  Acute Rehab PT Goals Patient Stated Goal: Less pain. Regain independence PT Goal Formulation: With patient/family Time For Goal Achievement: 11/29/19 Potential to Achieve Goals: Fair    Frequency Min 3X/week   Barriers to discharge Decreased caregiver support      Co-evaluation               AM-PAC PT "6 Clicks" Mobility  Outcome Measure Help needed turning from your back to your side while in a flat bed without using bedrails?: A Lot Help needed moving from lying on your back to sitting on the side of a flat bed without using bedrails?: A Lot Help needed moving to and from a bed to a chair (including a wheelchair)?: A Lot Help needed standing up from a chair using your arms (e.g., wheelchair or bedside chair)?: A Lot Help needed to walk in hospital room?: A Lot Help needed climbing 3-5 steps with a railing? : Total 6 Click Score: 11    End of Session Equipment Utilized During Treatment: Gait belt Activity Tolerance: Patient limited by fatigue;Patient limited by pain Patient left: in chair;with call bell/phone within reach;with chair alarm  set;with family/visitor present   PT Visit Diagnosis: Muscle weakness (generalized) (M62.81);History of falling (Z91.81);Other abnormalities of gait and mobility (R26.89);Pain Pain - Right/Left: Left Pain - part of body: Hip    Time: 1007-1219 PT Time Calculation (min) (ACUTE ONLY): 12 min   Charges:   PT Evaluation $PT Eval Low Complexity: [place], PT Acute Rehabilitation  Office: [phone] Pager: [phone]

## 2019-11-15 NOTE — NC FL2 (Signed)
[place] LEVEL OF CARE SCREENING TOOL     IDENTIFICATION  Patient Name: [name] [name] Birthdate: 1922/08/25 Sex: female Admission Date (Current Location): 11/14/2019  [facility] and [place] Number:  Herbalist and Address:  [facility],  [place], [place]      Provider Number: [id number]  Attending Physician Name and Address:  Georgette Shell, MD  Relative Name and Phone Number:       Current Level of Care: Hospital Recommended Level of Care: [place] Prior Approval Number:    Date Approved/Denied:   PASRR Number: [id number]  Discharge Plan: SNF    Current Diagnoses: Patient Active Problem List   Diagnosis Date Noted  . Closed left hip fracture, initial encounter ([place]) 11/14/2019  . Hypokalemia 11/14/2019  . Pre-op evaluation 11/14/2019  . Prolonged QT interval 11/14/2019  . Noninfected skin tear of lower extremity, left, initial encounter 11/14/2019  . Goals of care, counseling/discussion   . DNR (do not resuscitate)   . Palliative care by specialist   . Esophageal dysmotility 07/18/2014  . Anxiety state 07/10/2014  . Headache 07/07/2014  . Intractable vomiting with nausea 07/07/2014  . Dehydration 07/07/2014  . UTI (urinary tract infection) 07/07/2014  . History of depression   . History of gallstones   . Hip fracture ([place])   . Hip fracture ([place])   . History of vertigo   . Arthritis   . RBBB (right bundle branch block)   . History of IBS     Orientation RESPIRATION BLADDER Height & Weight     Self, Time, Situation, Place  Normal Continent Weight: 45 kg Height:  5' (152.4 cm)  BEHAVIORAL SYMPTOMS/MOOD NEUROLOGICAL BOWEL NUTRITION STATUS      Continent Diet  AMBULATORY STATUS COMMUNICATION OF NEEDS Skin   Extensive Assist Verbally Normal                       Personal Care Assistance Level of Assistance  Bathing, Dressing, Total care Bathing Assistance: Maximum  assistance   Dressing Assistance: Maximum assistance Total Care Assistance: Maximum assistance   Functional Limitations Info             SPECIAL CARE FACTORS FREQUENCY  PT (By licensed PT), OT (By licensed OT)     PT Frequency: 5x weekly OT Frequency: 5x weekly            Contractures Contractures Info: Not present    Additional Factors Info  Code Status, Allergies Code Status Info: DNR Allergies Info: Codeine, Crestor, Escitalopram Oxalate, Ezetimibe-simvastatin           Current Medications (11/15/2019):  This is the current hospital active medication list Current Facility-Administered Medications  Medication Dose Route Frequency Provider Last Rate Last Admin  . 0.9 %  sodium chloride infusion   Intravenous Once Georgette Shell, MD      . acetaminophen (TYLENOL) tablet 325-650 mg  325-650 mg Oral Q6H PRN Rod Can, MD      . acetaminophen (TYLENOL) tablet 500 mg  500 mg Oral Q6H Georgette Shell, MD      . amLODipine (NORVASC) tablet 2.5 mg  2.5 mg Oral Daily Charlynne Cousins, MD      . dextrose 5 % and 0.45 % NaCl with KCl 30 mEq/L infusion   Intravenous Continuous Charlynne Cousins, MD 75 mL/hr at 11/15/19 0902 Restarted at 11/15/19 0902  . docusate sodium (COLACE) capsule 100  mg  100 mg Oral BID Rod Can, MD   100 mg at 11/15/19 0858  . enoxaparin (LOVENOX) injection 30 mg  30 mg Subcutaneous Q24H Swinteck, Aaron Edelman, MD   30 mg at 11/15/19 0859  . haloperidol lactate (HALDOL) injection 1 mg  1 mg Intravenous Q6H PRN Charlynne Cousins, MD      . HYDROcodone-acetaminophen [facility]) 7.5-325 MG per tablet 1-2 tablet  1-2 tablet Oral Q4H PRN Rod Can, MD      . HYDROcodone-acetaminophen (NORCO/VICODIN) 5-325 MG per tablet 1-2 tablet  1-2 tablet Oral Q4H PRN Rod Can, MD   1 tablet at 11/15/19 0858  . menthol-cetylpyridinium (CEPACOL) lozenge 3 mg  1 lozenge Oral PRN Swinteck, Aaron Edelman, MD       Or  . phenol (CHLORASEPTIC) mouth  spray 1 spray  1 spray Mouth/Throat PRN Rod Can, MD   1 spray at 11/15/19 0705  . methocarbamol (ROBAXIN) tablet 500 mg  500 mg Oral Q6H PRN Swinteck, Aaron Edelman, MD       Or  . methocarbamol (ROBAXIN) 500 mg in dextrose 5 % 50 mL IVPB  500 mg Intravenous Q6H PRN Rod Can, MD   Stopped at 11/14/19 1532  . morphine 2 MG/ML injection 0.5-1 mg  0.5-1 mg Intravenous Q2H PRN Swinteck, Aaron Edelman, MD      . ondansetron [facility]) tablet 4 mg  4 mg Oral Q6H PRN Swinteck, Aaron Edelman, MD       Or  . ondansetron (ZOFRAN) injection 4 mg  4 mg Intravenous Q6H PRN Rod Can, MD   4 mg at 11/14/19 1931  . polyethylene glycol (MIRALAX / GLYCOLAX) packet 17 g  17 g Oral Daily Georgette Shell, MD         Discharge Medications: Please see discharge summary for a list of discharge medications.  Relevant Imaging Results:  Relevant Lab Results:   Additional Information SSN [id number]  Joaquin Courts, RN

## 2019-11-15 NOTE — Progress Notes (Signed)
PROGRESS NOTE    [name] [name]  ENI:[id number] DOB: 07/18/1922 DOA: 11/14/2019 PCP: Lajean Manes, MD    Brief Narrative: 86 year old female admitted with mechanical fall while picking up a trash can and right hip fracture status post repair 11/14/2019.  Assessment & Plan:   Active Problems:   Closed left hip fracture, initial encounter ([place])   Hypokalemia   Pre-op evaluation   Prolonged QT interval   Noninfected skin tear of lower extremity, left, initial encounter   Goals of care, counseling/discussion   DNR (do not resuscitate)   Palliative care by specialist   #1 left hip fracture left hip hemiarthroplasty 11/14/2019. Tylenol around-the-clock for pain control with morphine and Norco as needed Continue Colace add MiraLAX Lovenox for DVT prophylaxis Seen by PT recommends SNF  #2 history of essential hypertension blood pressure running too soft I have stopped the Norvasc.  Restart if needed.  #3 prolonged QTc 514 repeat EKG today.  #4 leukocytosis resolved white count 8.7  #5 hypokalemia resolved potassium 4.4   #6 dehydration urine ketones positive on admission.  We will give her a bag of normal saline slow.  #7 goals of care patient is DNR  Estimated body mass index is 19.38 kg/m as calculated from the following:   Height as of this encounter: 5' (1.524 m).   Weight as of this encounter: 45 kg.  DVT prophylaxis: Lovenox Code Status: Full code  family Communication: Discussed with daughter-in-law Disposition Plan:  Status is: Inpatient  Dispo: The patient is from: Home she lives alone              Anticipated d/c is to: SNF              Anticipated d/c date is: Unknown              Patient currently is not medically stable to d/c.  Patient status post hip surgery seen by physical therapy needs IV hydration blood pressure soft hopefully discharge in 1 to 2 days    Consultants: Ortho  Procedures: Left hip surgery 11/14/2019 Antimicrobials:  None  Subjective: Patient extremely hard of hearing but very sweet young lady wants to start walking again  Objective: Vitals:   11/14/19 2034 11/15/19 0126 11/15/19 0459 11/15/19 0930  BP: 135/63 (!) 110/56 (!) 117/57 (!) 122/52  Pulse: 95 91 83 77  Resp: 18 18 18 15   Temp: 98 F (36.7 C) 98.4 F (36.9 C) 98.6 F (37 C) 98.6 F (37 C)  TempSrc: Oral Oral Oral   SpO2: 98% 100% 100% 98%  Weight:      Height:        Intake/Output Summary (Last 24 hours) at 11/15/2019 1057 Last data filed at 11/15/2019 0915 Gross per 24 hour  Intake 2947.5 ml  Output 1550 ml  Net 1397.5 ml   Filed Weights   11/14/19 0645  Weight: 45 kg    Examination:  General exam: Appears calm and comfortable  Respiratory system: Clear to auscultation. Respiratory effort normal. Cardiovascular system: S1 & S2 heard, RRR. No JVD, murmurs, rubs, gallops or clicks. No pedal edema. Gastrointestinal system: Abdomen is nondistended, soft and nontender. No organomegaly or masses felt. Normal bowel sounds heard. Central nervous system: Alert and oriented. No focal neurological deficits. Extremities: Left hip incision covered with dressing appears clean dry intact. Skin: No rashes, lesions or ulcers Psychiatry: Judgement and insight appear normal. Mood & affect appropriate.     Data Reviewed: I have personally reviewed following  labs and imaging studies  CBC: Recent Labs  Lab 11/14/19 0655 11/15/19 0320  WBC 10.7* 8.7  NEUTROABS 9.1*  --   HGB 13.1 9.4*  HCT 40.8 30.1*  MCV 91.7 93.5  PLT 272 431   Basic Metabolic Panel: Recent Labs  Lab 11/14/19 0655 11/15/19 0320  NA 139 135  K 3.2* 4.4  CL 107 104  CO2 23 24  GLUCOSE 135* 166*  BUN 10 10  CREATININE 0.48 0.65  CALCIUM 8.9 8.2*  MG 1.9  --    GFR: Estimated Creatinine Clearance: 29.9 mL/min (by C-G formula based on SCr of 0.65 mg/dL). Liver Function Tests: No results for input(s): AST, ALT, ALKPHOS, BILITOT, PROT, ALBUMIN in the  last 168 hours. No results for input(s): LIPASE, AMYLASE in the last 168 hours. No results for input(s): AMMONIA in the last 168 hours. Coagulation Profile: No results for input(s): INR, PROTIME in the last 168 hours. Cardiac Enzymes: No results for input(s): CKTOTAL, CKMB, CKMBINDEX, TROPONINI in the last 168 hours. BNP (last 3 results) No results for input(s): PROBNP in the last 8760 hours. HbA1C: No results for input(s): HGBA1C in the last 72 hours. CBG: No results for input(s): GLUCAP in the last 168 hours. Lipid Profile: No results for input(s): CHOL, HDL, LDLCALC, TRIG, CHOLHDL, LDLDIRECT in the last 72 hours. Thyroid Function Tests: No results for input(s): TSH, T4TOTAL, FREET4, T3FREE, THYROIDAB in the last 72 hours. Anemia Panel: No results for input(s): VITAMINB12, FOLATE, FERRITIN, TIBC, IRON, RETICCTPCT in the last 72 hours. Sepsis Labs: No results for input(s): PROCALCITON, LATICACIDVEN in the last 168 hours.  Recent Results (from the past 240 hour(s))  SARS Coronavirus 2 by RT PCR (hospital order, performed in [facility] hospital lab) Nasopharyngeal Nasopharyngeal Swab     Status: None   Collection Time: 11/14/19  7:44 AM   Specimen: Nasopharyngeal Swab  Result Value Ref Range Status   SARS Coronavirus 2 NEGATIVE NEGATIVE Final    Comment: (NOTE) SARS-CoV-2 target nucleic acids are NOT DETECTED.  The SARS-CoV-2 RNA is generally detectable in upper and lower respiratory specimens during the acute phase of infection. The lowest concentration of SARS-CoV-2 viral copies this assay can detect is 250 copies / mL. A negative result does not preclude SARS-CoV-2 infection and should not be used as the sole basis for treatment or other patient management decisions.  A negative result may occur with improper specimen collection / handling, submission of specimen other than nasopharyngeal swab, presence of viral mutation(s) within the areas targeted by this assay, and  inadequate number of viral copies (<250 copies / mL). A negative result must be combined with clinical observations, patient history, and epidemiological information.  Fact Sheet for Patients:   StrictlyIdeas.no  Fact Sheet for Healthcare Providers: BankingDealers.co.za  This test is not yet approved or  cleared by the [place] FDA and has been authorized for detection and/or diagnosis of SARS-CoV-2 by FDA under an Emergency Use Authorization (EUA).  This EUA will remain in effect (meaning this test can be used) for the duration of the COVID-19 declaration under Section 564(b)(1) of the Act, 21 U.S.C. section 360bbb-3(b)(1), unless the authorization is terminated or revoked sooner.  Performed at [facility], [place] [address] [place], [place] [address]   Surgical pcr screen     Status: None   Collection Time: 11/14/19 10:32 AM   Specimen: Nasal Mucosa; Nasal Swab  Result Value Ref Range Status   MRSA, PCR NEGATIVE NEGATIVE Final  Staphylococcus aureus NEGATIVE NEGATIVE Final    Comment: (NOTE) The Xpert SA Assay (FDA approved for NASAL specimens in patients 74 years of age and older), is one component of a comprehensive surveillance program. It is not intended to diagnose infection nor to guide or monitor treatment. Performed at [facility], [place] [address] [place], [place] [address]          Radiology Studies: DG Chest 1 View  Result Date: 11/14/2019 CLINICAL DATA:  Fall.  Severe left hip pain. EXAM: CHEST  1 VIEW COMPARISON:  Chest x-ray dated January 26, 2010. FINDINGS: The heart size and mediastinal contours are within normal limits. Normal pulmonary vascularity. Insert elevation of the right hemidiaphragm. No acute osseous abnormality. 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 IMPRESSION: 1. No active disease. Electronically Signed   By:  Titus Dubin M.D.   On: 11/14/2019 08:07   DG Tibia/Fibula Right  Result Date: 11/14/2019 CLINICAL DATA:  Suspected hip fracture.  RIGHT lower leg pain. EXAM: RIGHT TIBIA AND FIBULA - 2 VIEW COMPARISON:  None FINDINGS: Osteopenia. Degenerative changes about the RIGHT knee incidentally noted, incompletely imaged. No signs of acute fracture or dislocation. IMPRESSION: 1. No signs of acute fracture or dislocation. 2. Osteopenia and degenerative changes. Electronically Signed   By: Zetta Bills M.D.   On: 11/14/2019 08:01   Pelvis Portable  Result Date: 11/14/2019 CLINICAL DATA:  Status post left hip arthroplasty. EXAM: PORTABLE PELVIS 1-2 VIEWS COMPARISON:  Left hip radiographs and intraoperative fluoroscopic images from 11/14/2019 FINDINGS: Sequelae of left hip hemiarthroplasty are again identified with postoperative gas in the surrounding soft tissues and skin staples in place. The prosthetic femoral head is approximated with the native acetabulum on this single image. No acute fracture is identified. Remote right femoral ORIF is again noted. IMPRESSION: Left hip hemiarthroplasty as above. Electronically Signed   By: Logan Bores M.D.   On: 11/14/2019 14:33   DG Shoulder Left Port  Result Date: 11/14/2019 CLINICAL DATA:  Left shoulder pain after fall. EXAM: LEFT SHOULDER COMPARISON:  None. FINDINGS: No acute fracture or dislocation. Old healed left fifth and sixth rib fractures. Mild acromioclavicular degenerative changes with marginal spurring. Osteopenia. Soft tissues are unremarkable. IMPRESSION: 1. No acute osseous abnormality. Electronically Signed   By: Titus Dubin M.D.   On: 11/14/2019 14:31   DG C-Arm 1-60 Min-No Report  Result Date: 11/14/2019 Fluoroscopy was utilized by the requesting physician.  No radiographic interpretation.   DG HIP OPERATIVE UNILAT W OR W/O PELVIS LEFT  Result Date: 11/14/2019 CLINICAL DATA:  Elective surgery EXAM: OPERATIVE LEFT HIP (WITH PELVIS IF  PERFORMED) 2 VIEWS TECHNIQUE: Fluoroscopic spot image(s) were submitted for interpretation post-operatively. COMPARISON:  11/14/2019 FLUOROSCOPY TIME 0 minutes 10 seconds Dose: 0.8511 mGy Images: 2 FINDINGS: Osseous demineralization. LEFT hip prosthesis. No acute fracture, dislocation, or bone destruction. IMPRESSION: LEFT hip prosthesis without acute complication. Electronically Signed   By: Lavonia Dana M.D.   On: 11/14/2019 13:25   DG Hip Unilat W or Wo Pelvis 2-3 Views Left  Result Date: 11/14/2019 CLINICAL DATA:  Suspected hip fracture patient from home with un witnessed fall complaining of severe LEFT hip pain EXAM: DG HIP (WITH OR WITHOUT PELVIS) 2-3V LEFT COMPARISON:  May 13, 2018 FINDINGS: Signs of displaced and angulated fracture with varus deformity of the LEFT femoral neck. Superior migration of the LEFT femur. Femoral head remains within the acetabulum. Evidence of prior RIGHT femoral ORIF. Some anterior  displacement of distal femur relative to femoral head on the cross-table lateral. Evidence of fracture, previous fracture of the RIGHT inferior pubic ramus. IMPRESSION: 1. Signs of displaced and angulated fracture of the LEFT femoral neck. 2. Prior RIGHT femoral ORIF. 3. Signs of prior fracture of the RIGHT inferior pubic ramus. Electronically Signed   By: Zetta Bills M.D.   On: 11/14/2019 07:52        Scheduled Meds: . acetaminophen  500 mg Oral Q6H  . amLODipine  2.5 mg Oral Daily  . docusate sodium  100 mg Oral BID  . enoxaparin (LOVENOX) injection  30 mg Subcutaneous Q24H   Continuous Infusions: . dexrose 5 % and 0.45 % NaCl with KCl 30 mEq/L 75 mL/hr at 11/15/19 0902  . methocarbamol (ROBAXIN) IV Stopped (11/14/19 1532)     LOS: 1 day     Georgette Shell, MD  11/15/2019, 10:57 AM

## 2019-11-15 NOTE — Progress Notes (Signed)
    Subjective:  Patient reports pain as mild to moderate.  Denies N/V/CP/SOB. Patient is hard of hearing. She is resting comfortably in bed and says that her pain is tolerable this morning.   Objective:   VITALS:   Vitals:   11/14/19 1850 11/14/19 2034 11/15/19 0126 11/15/19 0459  BP: 126/61 135/63 (!) 110/56 (!) 117/57  Pulse: (!) 101 95 91 83  Resp: 16 18 18 18   Temp: 97.9 F (36.6 C) 98 F (36.7 C) 98.4 F (36.9 C) 98.6 F (37 C)  TempSrc: Oral Oral Oral Oral  SpO2: 100% 98% 100% 100%  Weight:      Height:        NAD ABD soft Neurovascular intact Sensation intact distally Intact pulses distally Dorsiflexion/Plantar flexion intact Incision: dressing C/D/I   Lab Results  Component Value Date   WBC 8.7 11/15/2019   HGB 9.4 (L) 11/15/2019   HCT 30.1 (L) 11/15/2019   MCV 93.5 11/15/2019   PLT 203 11/15/2019   BMET    Component Value Date/Time   NA 135 11/15/2019 0320   K 4.4 11/15/2019 0320   CL 104 11/15/2019 0320   CO2 24 11/15/2019 0320   GLUCOSE 166 (H) 11/15/2019 0320   BUN 10 11/15/2019 0320   CREATININE 0.65 11/15/2019 0320   CALCIUM 8.2 (L) 11/15/2019 0320   GFRNONAA >60 11/15/2019 0320   GFRAA >60 11/15/2019 0320     Assessment/Plan: 1 Day Post-Op   Active Problems:   Closed left hip fracture, initial encounter ([facility])   Hypokalemia   Pre-op evaluation   Prolonged QT interval   Noninfected skin tear of lower extremity, left, initial encounter   Goals of care, counseling/discussion   DNR (do not resuscitate)   Palliative care by specialist   WBAT with walker DVT ppx: Lovenox, SCDs, TEDS PO pain control ABLA: asymptomaticm, treat per hospitalist recommendations PT/OT Dispo: Likely SNF for rehab     Dorothyann Peng 11/15/2019, 8:07 AM [place] is now [facility] [address] [place], [place], [place] [address] Phone: [phone] [email or web address] Facebook  [employer]

## 2019-11-16 DIAGNOSIS — D62 Acute posthemorrhagic anemia: Secondary | ICD-10-CM | POA: Diagnosis not present

## 2019-11-16 DIAGNOSIS — E860 Dehydration: Secondary | ICD-10-CM

## 2019-11-16 DIAGNOSIS — I10 Essential (primary) hypertension: Secondary | ICD-10-CM

## 2019-11-16 LAB — BASIC METABOLIC PANEL
Anion gap: 11 (ref 5–15)
BUN: 9 mg/dL (ref 8–23)
CO2: 23 mmol/L (ref 22–32)
Calcium: 8.6 mg/dL — ABNORMAL LOW (ref 8.9–10.3)
Chloride: 108 mmol/L (ref 98–111)
Creatinine, Ser: 0.56 mg/dL (ref 0.44–1.00)
GFR calc Af Amer: >60 mL/min (ref >60)
GFR calc non Af Amer: >60 mL/min (ref >60)
Glucose, Bld: 111 mg/dL — ABNORMAL HIGH (ref 70–99)
Potassium: 4.1 mmol/L (ref 3.5–5.1)
Sodium: 142 mmol/L (ref 135–145)

## 2019-11-16 LAB — CBC
HCT: 33.7 % — ABNORMAL LOW (ref 36.0–46.0)
Hemoglobin: 10.4 g/dL — ABNORMAL LOW (ref 12.0–15.0)
MCH: 29.1 pg (ref 26.0–34.0)
MCHC: 30.9 g/dL (ref 30.0–36.0)
MCV: 94.4 fL (ref 80.0–100.0)
Platelets: 226 10*3/uL (ref 150–400)
RBC: 3.57 MIL/uL — ABNORMAL LOW (ref 3.87–5.11)
RDW: 12.9 % (ref 11.5–15.5)
WBC: 9.2 10*3/uL (ref 4.0–10.5)
nRBC: 0 % (ref 0.0–0.2)

## 2019-11-16 MED ORDER — ACETAMINOPHEN 325 MG PO TABS
650.0000 mg | ORAL_TABLET | Freq: Four times a day (QID) | ORAL | Status: DC | PRN
  Administered 2019-11-16 – 2019-11-20 (×4): 650 mg via ORAL
  Filled 2019-11-16 (×4): qty 2

## 2019-11-16 NOTE — TOC Progression Note (Signed)
Transition of Care [facility]) - Progression Note    Patient Details  Name: [name] [name] MRN: [id number] Date of Birth: 30-Mar-1923  Transition of Care [facility]) CM/SW Contact  Lennart Pall, LCSW Phone Number: 11/16/2019, 3:09 PM  Clinical Narrative:    Have received several SNF bed offers and have reviewed with pt and daughter-in-law.  They have accepted bed at [facility] with plan to transfer tomorrow.  Does NOT need another COVID test.   Expected Discharge Plan: [place] Barriers to Discharge: Continued Medical Work up  Expected Discharge Plan and Services Expected Discharge Plan: [place]   Discharge Planning Services: CM Consult Post Acute Care Choice: [place] Living arrangements for the past 2 months: Single Family Home                 DME Arranged: N/A DME Agency: NA       HH Arranged: NA HH Agency: NA         Social Determinants of Health (SDOH) Interventions    Readmission Risk Interventions No flowsheet data found.

## 2019-11-16 NOTE — Plan of Care (Signed)
°  Problem: Activity: Goal: Risk for activity intolerance will decrease Outcome: Progressing   Problem: Safety: Goal: Ability to remain free from injury will improve Outcome: Progressing   Problem: Clinical Measurements: Goal: Diagnostic test results will improve Outcome: Progressing   Problem: Activity: Goal: Risk for activity intolerance will decrease Outcome: Progressing   Problem: Safety: Goal: Ability to remain free from injury will improve Outcome: Progressing

## 2019-11-16 NOTE — Progress Notes (Signed)
PROGRESS NOTE    [name] [name]  KDX:[id number] DOB: 31-Dec-1922 DOA: 11/14/2019 PCP: Lajean Manes, MD    Chief Complaint  Patient presents with  . Hip Injury    Brief Narrative:  Patient 86 year old lady who was admitted with mechanical fall while picking up trash can and sustained a right hip fracture status post repair 11/14/2019.  Seen by PT who are recommending SNF placement.   Assessment & Plan:   Principal Problem:   Closed left hip fracture, initial encounter [facility]) Active Problems:   Hypokalemia   Pre-op evaluation   Prolonged QT interval   Noninfected skin tear of lower extremity, left, initial encounter   Goals of care, counseling/discussion   DNR (do not resuscitate)   Palliative care by specialist   Postoperative anemia due to acute blood loss   Essential hypertension  #1 left hip fracture status post left hip hemiarthroplasty 11/14/2019 per Dr. Lyla Glassing Secondary to mechanical fall.  Patient states adequate pain control at this time.  Continue current regimen of scheduled Tylenol, Norco as needed, IV morphine as needed severe pain.  Patient seen by PT/OT who are recommending SNF placement.  Orthopedics following.  2.  Hypertension Patient with soft blood pressure.  Discontinue Norvasc.  Supportive care.  If blood pressure starts becoming elevated will resume home regimen Norvasc.  3.  QTc prolongation Repeat EKG with resolution of QTC prolongation.  4.  Hypokalemia Repleted.  5.  Dehydration Improved with hydration.  Saline lock IV fluids.  6.  Reactive leukocytosis Resolved.  7.  Postop acute blood loss anemia Patient with no overt bleeding.  Hemoglobin seems to have stabilized at 10.4.  Saline lock IV fluids.  Transfusion threshold hemoglobin < 7.  Supportive care.  Follow.    DVT prophylaxis: Per orthopedics/Lovenox Code Status: DNR Family Communication: Updated patient.  No family at bedside. Disposition:   Status is: Inpatient    Dispo:  The patient is from: Home              Anticipated d/c is to: SNF              Anticipated d/c date is: 11/17/2019              Patient currently stable and awaiting clearance by orthopedics and SNF placement.       Consultants:   Orthopedics: Dr. Lyla Glassing 11/14/2019  Palliative care: Vinie Sill, NP 9/76/7341  Procedures:   Plain films of the left shoulder 11/14/2019  Plain films of the pelvis 11/14/2019  Chest x-ray 11/14/2019  Plain films of the left hip and pelvis 11/14/2019  Plain films of the right tib-fib 11/14/2019  Plain films of the pelvis 11/14/2019  Left hip hemiarthroplasty per Dr. Lyla Glassing 11/14/2019  Antimicrobials:   None   Subjective: Sitting up in bed.  States pain is controlled.  Denies any chest pain or shortness of breath.  Hard of hearing in the left ear.  Tolerating current diet.  Denies any overt bleeding.  Objective: Vitals:   11/15/19 1445 11/15/19 2332 11/16/19 0703 11/16/19 1359  BP: (!) 147/65 (!) 144/64 128/63 129/70  Pulse: 100 (!) 105 (!) 101 (!) 106  Resp: 16 18 18 18   Temp: 98.1 F (36.7 C) 97.9 F (36.6 C) 98.2 F (36.8 C) 98.1 F (36.7 C)  TempSrc:  Oral Oral Oral  SpO2:  94% 96% 100%  Weight:      Height:        Intake/Output Summary (Last 24 hours) at 11/16/2019  [place] filed at 11/16/2019 1358 Gross per 24 hour  Intake 808.7 ml  Output 1450 ml  Net -641.3 ml   Filed Weights   11/14/19 0645  Weight: 45 kg    Examination:  General exam: Appears calm and comfortable  Respiratory system: Clear to auscultation anterior lung fields. Respiratory effort normal. Cardiovascular system: RRR. S1 & S2 heard, RRR. No JVD, murmurs, rubs, gallops or clicks. No pedal edema. Gastrointestinal system: Abdomen is nondistended, soft and nontender. No organomegaly or masses felt. Normal bowel sounds heard. Central nervous system: Alert and oriented. No focal neurological deficits. Extremities: Symmetric 5 x 5 power. Skin: No  rashes, lesions or ulcers Psychiatry: Judgement and insight appear normal. Mood & affect appropriate.     Data Reviewed: I have personally reviewed following labs and imaging studies  CBC: Recent Labs  Lab 11/14/19 0655 11/15/19 0320 11/16/19 0304  WBC 10.7* 8.7 9.2  NEUTROABS 9.1*  --   --   HGB 13.1 9.4* 10.4*  HCT 40.8 30.1* 33.7*  MCV 91.7 93.5 94.4  PLT 272 203 578    Basic Metabolic Panel: Recent Labs  Lab 11/14/19 0655 11/15/19 0320 11/16/19 0304  NA 139 135 142  K 3.2* 4.4 4.1  CL 107 104 108  CO2 23 24 23   GLUCOSE 135* 166* 111*  BUN 10 10 9   CREATININE 0.48 0.65 0.56  CALCIUM 8.9 8.2* 8.6*  MG 1.9  --   --     GFR: Estimated Creatinine Clearance: 29.9 mL/min (by C-G formula based on SCr of 0.56 mg/dL).  Liver Function Tests: No results for input(s): AST, ALT, ALKPHOS, BILITOT, PROT, ALBUMIN in the last 168 hours.  CBG: No results for input(s): GLUCAP in the last 168 hours.   Recent Results (from the past 240 hour(s))  SARS Coronavirus 2 by RT PCR (hospital order, performed in [facility] hospital lab) Nasopharyngeal Nasopharyngeal Swab     Status: None   Collection Time: 11/14/19  7:44 AM   Specimen: Nasopharyngeal Swab  Result Value Ref Range Status   SARS Coronavirus 2 NEGATIVE NEGATIVE Final    Comment: (NOTE) SARS-CoV-2 target nucleic acids are NOT DETECTED.  The SARS-CoV-2 RNA is generally detectable in upper and lower respiratory specimens during the acute phase of infection. The lowest concentration of SARS-CoV-2 viral copies this assay can detect is 250 copies / mL. A negative result does not preclude SARS-CoV-2 infection and should not be used as the sole basis for treatment or other patient management decisions.  A negative result may occur with improper specimen collection / handling, submission of specimen other than nasopharyngeal swab, presence of viral mutation(s) within the areas targeted by this assay, and inadequate number  of viral copies (<250 copies / mL). A negative result must be combined with clinical observations, patient history, and epidemiological information.  Fact Sheet for Patients:   StrictlyIdeas.no  Fact Sheet for Healthcare Providers: BankingDealers.co.za  This test is not yet approved or  cleared by the [place] FDA and has been authorized for detection and/or diagnosis of SARS-CoV-2 by FDA under an Emergency Use Authorization (EUA).  This EUA will remain in effect (meaning this test can be used) for the duration of the COVID-19 declaration under Section 564(b)(1) of the Act, 21 U.S.C. section 360bbb-3(b)(1), unless the authorization is terminated or revoked sooner.  Performed at [facility], [place] [address] [place], [place] [address]   Surgical pcr screen     Status: None  Collection Time: 11/14/19 10:32 AM   Specimen: Nasal Mucosa; Nasal Swab  Result Value Ref Range Status   MRSA, PCR NEGATIVE NEGATIVE Final   Staphylococcus aureus NEGATIVE NEGATIVE Final    Comment: (NOTE) The Xpert SA Assay (FDA approved for NASAL specimens in patients 10 years of age and older), is one component of a comprehensive surveillance program. It is not intended to diagnose infection nor to guide or monitor treatment. Performed at [facility], [place] [address], [place], [place] [address]          Radiology Studies: No results found.      Scheduled Meds: . acetaminophen  500 mg Oral Q6H  . docusate sodium  100 mg Oral BID  . enoxaparin (LOVENOX) injection  30 mg Subcutaneous Q24H  . polyethylene glycol  17 g Oral Daily   Continuous Infusions: . methocarbamol (ROBAXIN) IV Stopped (11/14/19 1532)     LOS: 2 days    Time spent: 35 minutes    Irine Seal, MD [facility]   To contact the attending provider between 7A-7P or the covering provider during after hours 7P-7A, please  log into the web site [email or web address] and access using universal [place] password for that web site. If you do not have the password, please call the hospital operator.  11/16/2019, 4:02 PM

## 2019-11-16 NOTE — Plan of Care (Signed)
Problem: Education: Goal: Knowledge of General Education information will improve Description: Including pain rating scale, medication(s)/side effects and non-pharmacologic comfort measures 11/16/2019 1210 by Deetta Perla, RN Outcome: Progressing 11/16/2019 1208 by Deetta Perla, RN Outcome: Progressing   Problem: Health Behavior/Discharge Planning: Goal: Ability to manage health-related needs will improve 11/16/2019 1210 by Deetta Perla, RN Outcome: Progressing 11/16/2019 1208 by Deetta Perla, RN Outcome: Progressing   Problem: Clinical Measurements: Goal: Ability to maintain clinical measurements within normal limits will improve 11/16/2019 1210 by Deetta Perla, RN Outcome: Progressing 11/16/2019 1208 by Deetta Perla, RN Outcome: Progressing Goal: Will remain free from infection 11/16/2019 1210 by Deetta Perla, RN Outcome: Progressing 11/16/2019 1208 by Deetta Perla, RN Outcome: Progressing Goal: Diagnostic test results will improve 11/16/2019 1210 by Deetta Perla, RN Outcome: Progressing 11/16/2019 1208 by Deetta Perla, RN Outcome: Progressing Goal: Respiratory complications will improve 11/16/2019 1210 by Deetta Perla, RN Outcome: Progressing 11/16/2019 1208 by Deetta Perla, RN Outcome: Progressing Goal: Cardiovascular complication will be avoided 11/16/2019 1210 by Deetta Perla, RN Outcome: Progressing 11/16/2019 1208 by Deetta Perla, RN Outcome: Progressing   Problem: Activity: Goal: Risk for activity intolerance will decrease 11/16/2019 1210 by Deetta Perla, RN Outcome: Progressing 11/16/2019 1208 by Deetta Perla, RN Outcome: Progressing   Problem: Nutrition: Goal: Adequate nutrition will be maintained 11/16/2019 1210 by Deetta Perla, RN Outcome: Progressing 11/16/2019 1208 by Deetta Perla, RN Outcome: Progressing   Problem: Coping: Goal: Level of anxiety will decrease 11/16/2019  1210 by Deetta Perla, RN Outcome: Progressing 11/16/2019 1208 by Deetta Perla, RN Outcome: Progressing   Problem: Elimination: Goal: Will not experience complications related to bowel motility 11/16/2019 1210 by Deetta Perla, RN Outcome: Progressing 11/16/2019 1208 by Deetta Perla, RN Outcome: Progressing Goal: Will not experience complications related to urinary retention 11/16/2019 1210 by Deetta Perla, RN Outcome: Progressing 11/16/2019 1208 by Deetta Perla, RN Outcome: Progressing   Problem: Pain Managment: Goal: General experience of comfort will improve 11/16/2019 1210 by Deetta Perla, RN Outcome: Progressing 11/16/2019 1208 by Deetta Perla, RN Outcome: Progressing   Problem: Safety: Goal: Ability to remain free from injury will improve 11/16/2019 1210 by Deetta Perla, RN Outcome: Progressing 11/16/2019 1208 by Deetta Perla, RN Outcome: Progressing   Problem: Skin Integrity: Goal: Risk for impaired skin integrity will decrease 11/16/2019 1210 by Deetta Perla, RN Outcome: Progressing 11/16/2019 1208 by Deetta Perla, RN Outcome: Progressing   Problem: Education: Goal: Knowledge of General Education information will improve Description: Including pain rating scale, medication(s)/side effects and non-pharmacologic comfort measures 11/16/2019 1210 by Deetta Perla, RN Outcome: Progressing 11/16/2019 1208 by Deetta Perla, RN Outcome: Progressing   Problem: Health Behavior/Discharge Planning: Goal: Ability to manage health-related needs will improve 11/16/2019 1210 by Deetta Perla, RN Outcome: Progressing 11/16/2019 1208 by Deetta Perla, RN Outcome: Progressing   Problem: Clinical Measurements: Goal: Ability to maintain clinical measurements within normal limits will improve 11/16/2019 1210 by Deetta Perla, RN Outcome: Progressing 11/16/2019 1208 by Deetta Perla, RN Outcome:  Progressing Goal: Will remain free from infection 11/16/2019 1210 by Deetta Perla, RN Outcome: Progressing 11/16/2019 1208 by Deetta Perla, RN Outcome: Progressing Goal: Diagnostic test results will improve 11/16/2019 1210 by Deetta Perla, RN Outcome: Progressing 11/16/2019 1208 by Deetta Perla, RN Outcome: Progressing Goal: Respiratory complications will improve 11/16/2019 1210 by  Mateya Torti, Helane Gunther, RN Outcome: Progressing 11/16/2019 1208 by Deetta Perla, RN Outcome: Progressing Goal: Cardiovascular complication will be avoided 11/16/2019 1210 by Deetta Perla, RN Outcome: Progressing 11/16/2019 1208 by Deetta Perla, RN Outcome: Progressing   Problem: Activity: Goal: Risk for activity intolerance will decrease 11/16/2019 1210 by Deetta Perla, RN Outcome: Progressing 11/16/2019 1208 by Deetta Perla, RN Outcome: Progressing   Problem: Nutrition: Goal: Adequate nutrition will be maintained 11/16/2019 1210 by Deetta Perla, RN Outcome: Progressing 11/16/2019 1208 by Deetta Perla, RN Outcome: Progressing   Problem: Coping: Goal: Level of anxiety will decrease 11/16/2019 1210 by Deetta Perla, RN Outcome: Progressing 11/16/2019 1208 by Deetta Perla, RN Outcome: Progressing   Problem: Elimination: Goal: Will not experience complications related to bowel motility 11/16/2019 1210 by Deetta Perla, RN Outcome: Progressing 11/16/2019 1208 by Deetta Perla, RN Outcome: Progressing Goal: Will not experience complications related to urinary retention 11/16/2019 1210 by Deetta Perla, RN Outcome: Progressing 11/16/2019 1208 by Deetta Perla, RN Outcome: Progressing   Problem: Pain Managment: Goal: General experience of comfort will improve 11/16/2019 1210 by Deetta Perla, RN Outcome: Progressing 11/16/2019 1208 by Deetta Perla, RN Outcome: Progressing   Problem: Safety: Goal: Ability to remain free  from injury will improve 11/16/2019 1210 by Deetta Perla, RN Outcome: Progressing 11/16/2019 1208 by Deetta Perla, RN Outcome: Progressing   Problem: Skin Integrity: Goal: Risk for impaired skin integrity will decrease 11/16/2019 1210 by Deetta Perla, RN Outcome: Progressing 11/16/2019 1208 by Deetta Perla, RN Outcome: Progressing

## 2019-11-16 NOTE — Plan of Care (Signed)

## 2019-11-16 NOTE — Progress Notes (Signed)
Physical Therapy Treatment Patient Details Name: [name] [name] MRN: [id number] DOB: [birth year] Today's Date: 11/16/2019    History of Present Illness 86 year old female admitted after fall resulting in L femoral neck fracture. Patient s/p L hip hemi direct anterior approach 11/14/19. PMH compression fx 07/2019, OA, vertigo, RBBB    PT Comments    Pt with high pain, requesting to use restroom upon arrival to room. Pt requiring min A to come to sitting EOB for LLE management and increased time. Pt able to stand from EOB with RW and mod A to power up, cues for hand placement, but unable to pivot over to [facility] or take steps so returned to sitting at EOB. Pt demonstrates closed kinetic chain weakness with increased genu valgus while rising and cued to activate glutes and quads to improve stance, but with decreased LLE weight-bearing in standing. Therapist positioned anterior to pt assisted with stand pivot over to [facility], requiring increased time, cues for sequencing, and assist for controlled descent; pt voided bladder during transfer and RN notified. Pt declines sitting up in chair, ambulation or exercises due to high pain and requests to return to bed. Assisted pt with pericare and assisted back to bed with all needs in reach and bed alarm on. Breakfast tray delivered while exiting room, pt unable to scoot up in bed so required max A to scoot up in bed in order to attend to meal. Session limited by pain. Patient will benefit from continued physical therapy in hospital and recommendations below to increase strength, balance, endurance for safe ADLs and gait.   Follow Up Recommendations  SNF     Equipment Recommendations  None recommended by PT    Recommendations for Other Services       Precautions / Restrictions Precautions Precautions: Fall Restrictions Weight Bearing Restrictions: No    Mobility  Bed Mobility Overal bed mobility: Needs Assistance Bed Mobility: Supine to Sit;Sit to Supine   Supine to sit: Min assist Sit to supine: Min assist   General bed mobility comments: min A for LLE management out of and back into bed, min A to upright trunk fully and cues to scoot forward to place feet on ground without physical assist; requiring increased time due to pain  Transfers Overall transfer level: Needs assistance Equipment used: Rolling walker (2 wheeled);None Transfers: Sit to/from [facility] Sit to Stand: Mod assist Stand pivot transfers: Mod assist (therapist in front of pt, no RW)  General transfer comment: mod A to rise from seated EOB, cues to avoid pulling on RW, weight-shift to RLE avoiding LLE weight-bearing, assist for controlled descent to sitting; mod A for stand pivot transfer to [facility] wtih therapist positioned in front of pt, heavy cues for hand placement and increased time due to high pain  Ambulation/Gait  General Gait Details: unable due to pain   Stairs             Wheelchair Mobility    Modified Rankin (Stroke Patients Only)       Balance Overall balance assessment: Needs assistance;History of Falls Sitting-balance support: Feet supported;Bilateral upper extremity supported Sitting balance-Leahy Scale: Poor Sitting balance - Comments: seated EOB with BUE assisting   Standing balance support: Bilateral upper extremity supported;During functional activity Standing balance-Leahy Scale: Poor Standing balance comment: BUE support and therapist assist         Cognition Arousal/Alertness: Awake/alert Behavior During Therapy: WFL for tasks assessed/performed Overall Cognitive Status: Within Functional Limits for tasks assessed  Exercises      General Comments        Pertinent Vitals/Pain Pain Assessment: Faces Faces Pain Scale: Hurts whole lot Pain Location: L hip Pain Descriptors / Indicators: Grimacing;Discomfort;Aching;Guarding;Moaning;Sore Pain Intervention(s): Limited activity within patient's  tolerance;Monitored during session;Premedicated before session;Repositioned;Ice applied    Home Living                      Prior Function            PT Goals (current goals can now be found in the care plan section) Acute Rehab PT Goals Patient Stated Goal: Less pain. Regain independence PT Goal Formulation: With patient/family Time For Goal Achievement: 11/29/19 Potential to Achieve Goals: Fair Progress towards PT goals: Progressing toward goals    Frequency    Min 3X/week      PT Plan Current plan remains appropriate    Co-evaluation              AM-PAC PT "6 Clicks" Mobility   Outcome Measure  Help needed turning from your back to your side while in a flat bed without using bedrails?: A Lot Help needed moving from lying on your back to sitting on the side of a flat bed without using bedrails?: A Lot Help needed moving to and from a bed to a chair (including a wheelchair)?: A Lot Help needed standing up from a chair using your arms (e.g., wheelchair or bedside chair)?: A Lot Help needed to walk in hospital room?: A Lot Help needed climbing 3-5 steps with a railing? : Total 6 Click Score: 11    End of Session Equipment Utilized During Treatment: Gait belt Activity Tolerance: Patient limited by fatigue;Patient limited by pain Patient left: in bed;with call bell/phone within reach;with bed alarm set Nurse Communication: Mobility status PT Visit Diagnosis: Muscle weakness (generalized) (M62.81);History of falling (Z91.81);Other abnormalities of gait and mobility (R26.89);Pain Pain - Right/Left: Left Pain - part of body: Hip     Time: 0086-7619 PT Time Calculation (min) (ACUTE ONLY): 27 min  Charges:  $Therapeutic Activity: 23-37 mins                     Tori Tinsley Everman PT, DPT 11/16/19, 11:34 AM

## 2019-11-17 ENCOUNTER — Inpatient Hospital Stay (HOSPITAL_COMMUNITY): Payer: Medicare Other

## 2019-11-17 ENCOUNTER — Encounter (HOSPITAL_COMMUNITY): Payer: Self-pay | Admitting: Internal Medicine

## 2019-11-17 DIAGNOSIS — I340 Nonrheumatic mitral (valve) insufficiency: Secondary | ICD-10-CM

## 2019-11-17 DIAGNOSIS — W19XXXA Unspecified fall, initial encounter: Secondary | ICD-10-CM

## 2019-11-17 DIAGNOSIS — R1310 Dysphagia, unspecified: Secondary | ICD-10-CM

## 2019-11-17 DIAGNOSIS — I361 Nonrheumatic tricuspid (valve) insufficiency: Secondary | ICD-10-CM

## 2019-11-17 DIAGNOSIS — D519 Vitamin B12 deficiency anemia, unspecified: Secondary | ICD-10-CM

## 2019-11-17 DIAGNOSIS — I4891 Unspecified atrial fibrillation: Secondary | ICD-10-CM

## 2019-11-17 DIAGNOSIS — S72002P Fracture of unspecified part of neck of left femur, subsequent encounter for closed fracture with malunion: Secondary | ICD-10-CM

## 2019-11-17 LAB — CBC
HCT: 30.5 % — ABNORMAL LOW (ref 36.0–46.0)
Hemoglobin: 9.7 g/dL — ABNORMAL LOW (ref 12.0–15.0)
MCH: 29.3 pg (ref 26.0–34.0)
MCHC: 31.8 g/dL (ref 30.0–36.0)
MCV: 92.1 fL (ref 80.0–100.0)
Platelets: 250 10*3/uL (ref 150–400)
RBC: 3.31 MIL/uL — ABNORMAL LOW (ref 3.87–5.11)
RDW: 13 % (ref 11.5–15.5)
WBC: 9.1 10*3/uL (ref 4.0–10.5)
nRBC: 0 % (ref 0.0–0.2)

## 2019-11-17 LAB — BASIC METABOLIC PANEL
Anion gap: 7 (ref 5–15)
BUN: 10 mg/dL (ref 8–23)
CO2: 22 mmol/L (ref 22–32)
Calcium: 8.2 mg/dL — ABNORMAL LOW (ref 8.9–10.3)
Chloride: 108 mmol/L (ref 98–111)
Creatinine, Ser: 0.41 mg/dL — ABNORMAL LOW (ref 0.44–1.00)
GFR calc Af Amer: >60 mL/min (ref >60)
GFR calc non Af Amer: >60 mL/min (ref >60)
Glucose, Bld: 115 mg/dL — ABNORMAL HIGH (ref 70–99)
Potassium: 3.6 mmol/L (ref 3.5–5.1)
Sodium: 137 mmol/L (ref 135–145)

## 2019-11-17 LAB — IRON AND TIBC
Iron: 14 ug/dL — ABNORMAL LOW (ref 28–170)
Saturation Ratios: 6 % — ABNORMAL LOW (ref 10.4–31.8)
TIBC: 228 ug/dL — ABNORMAL LOW (ref 250–450)
UIBC: 214 ug/dL

## 2019-11-17 LAB — FOLATE: Folate: 8.9 ng/mL (ref >5.9)

## 2019-11-17 LAB — ECHOCARDIOGRAM COMPLETE
Area-P 1/2: 3.65 cm2
Height: 60 in
S' Lateral: 2.6 cm
Weight: 1587.31 oz

## 2019-11-17 LAB — TSH: TSH: 1.81 u[IU]/mL (ref 0.350–4.500)

## 2019-11-17 LAB — TROPONIN I (HIGH SENSITIVITY)
Troponin I (High Sensitivity): 8 ng/L (ref <18)
Troponin I (High Sensitivity): 9 ng/L (ref <18)

## 2019-11-17 LAB — FERRITIN: Ferritin: 187 ng/mL (ref 11–307)

## 2019-11-17 LAB — MAGNESIUM: Magnesium: 2.2 mg/dL (ref 1.7–2.4)

## 2019-11-17 LAB — VITAMIN B12: Vitamin B-12: 159 pg/mL — ABNORMAL LOW (ref 180–914)

## 2019-11-17 MED ORDER — METOPROLOL TARTRATE 5 MG/5ML IV SOLN
5.0000 mg | INTRAVENOUS | Status: DC | PRN
  Administered 2019-11-17: 5 mg via INTRAVENOUS
  Filled 2019-11-17 (×2): qty 5

## 2019-11-17 MED ORDER — METOPROLOL TARTRATE 25 MG PO TABS
12.5000 mg | ORAL_TABLET | Freq: Two times a day (BID) | ORAL | Status: DC
  Administered 2019-11-17 (×2): 12.5 mg via ORAL
  Filled 2019-11-17 (×2): qty 1

## 2019-11-17 MED ORDER — PANTOPRAZOLE SODIUM 40 MG PO TBEC
40.0000 mg | DELAYED_RELEASE_TABLET | Freq: Every day | ORAL | Status: DC
  Administered 2019-11-17 – 2019-11-22 (×5): 40 mg via ORAL
  Filled 2019-11-17 (×7): qty 1

## 2019-11-17 MED ORDER — SODIUM CHLORIDE 0.9 % IV SOLN: INTRAVENOUS | Status: DC

## 2019-11-17 MED ORDER — POTASSIUM CHLORIDE 20 MEQ PO PACK
40.0000 meq | PACK | Freq: Once | ORAL | Status: AC
  Administered 2019-11-17: 40 meq via ORAL
  Filled 2019-11-17: qty 2

## 2019-11-17 MED ORDER — CYANOCOBALAMIN 1000 MCG/ML IJ SOLN
1000.0000 ug | Freq: Every day | INTRAMUSCULAR | Status: DC
  Administered 2019-11-17 – 2019-11-22 (×6): 1000 ug via SUBCUTANEOUS
  Filled 2019-11-17 (×6): qty 1

## 2019-11-17 NOTE — Care Management Important Message (Signed)
Important Message  Patient Details IM Letter given to the Patient Name: [name] [name] MRN: [id number] Date of Birth: 04-14-1922   Medicare Important Message Given:  Yes     Kerin Salen 11/17/2019, 10:48 AM

## 2019-11-17 NOTE — Progress Notes (Signed)
Pt c/o chest pressure, ekg stat done and showed Afib rvr, provider on call paged waiting for further order; rapid response nurse is here assessing pt. Will continue to monitor pt. See VS in flowsheet

## 2019-11-17 NOTE — Progress Notes (Signed)
PROGRESS NOTE    [name] [name]  NAT:[id number] DOB: March 02, 1923 DOA: 11/14/2019 PCP: Lajean Manes, MD    Chief Complaint  Patient presents with  . Hip Injury    Brief Narrative:  Patient 86 year old lady who was admitted with mechanical fall while picking up trash can and sustained a right hip fracture status post repair 11/14/2019.  Seen by PT who are recommending SNF placement.   Assessment & Plan:   Principal Problem:   Closed left hip fracture, initial encounter [facility]) Active Problems:   Hypokalemia   Pre-op evaluation   Prolonged QT interval   Noninfected skin tear of lower extremity, left, initial encounter   Goals of care, counseling/discussion   DNR (do not resuscitate)   Palliative care by specialist   Postoperative anemia due to acute blood loss   Essential hypertension  1 left hip fracture status post left hip hemiarthroplasty 11/14/2019 per Dr. Lyla Glassing Secondary to mechanical fall.  Patient states adequate pain control at this time.  Continue current regimen of scheduled Tylenol, Norco as needed, IV morphine as needed severe pain.  Patient seen by PT/OT who are recommending SNF placement.  Orthopedics following.  2.  New onset A. fib Patient noted to go into atrial fibrillation overnight.  Patient with no prior history of atrial fibrillation.  Cardiac enzymes obtained negative.  2D echo ordered and pending.  Patient seen in consultation by cardiology and patient started on low-dose beta-blocker to prevent patient from going back into A. fib with RVR.  Cardiology not recommending anticoagulation at this time given short nature of patient's A. fib and inciting event of hip fracture.  Patient currently on aspirin for DVT prophylaxis per orthopedics for hip surgery.  Follow.  3.  Hypertension Patient with soft blood pressure.  Norvasc has been discontinued.  Supportive care.    4.  QTc prolongation Repeat EKG with resolution of QTC prolongation.  5.   Hypokalemia Repleted.  Potassium at 3.6.  Potassium packet 40 mEq p.o. x1.  Magnesium at 2.2.  Patient with new onset A. fib and as such we will try to keep potassium close to 4.  6.  Dehydration Improved with hydration.  IV fluids have been saline locked.  Follow.   7.  Reactive leukocytosis Resolved.  8.  Postop acute blood loss anemia/anemia of chronic disease/vitamin B12 deficiency. Patient with no overt bleeding.  Hemoglobin currently at 9.7.  IV fluids have been saline locked.  Anemia panel with iron of 14, TIBC of 228, ferritin of 187, folate of 8.9, vitamin B12 of 159.  Place on vitamin B12 1000 MCG subcutaneously daily x7 days, then weekly x1 month, then monthly.  May consider IV Feraheme prior to discharge.  Will need oral iron supplementation on discharge.  Follow.  9.  Dysphagia Patient with complaints of difficulty swallowing feels like food may be getting stuck however patient also with significant burping.  Placed on PPI.  SLP evaluation.  Downgrade diet to a dysphagia 3 diet.  Supportive care.    DVT prophylaxis: Per orthopedics/Lovenox Code Status: DNR Family Communication: Updated patient.  No family at bedside. Disposition:   Status is: Inpatient    Dispo: The patient is from: Home              Anticipated d/c is to: SNF              Anticipated d/c date is: 2 to 3 days.  Patient noted to have gone into atrial fibrillation the night of 11/16/2019, currently being assessed by cardiology for A. fib.  Not stable for discharge.         Consultants:   Orthopedics: Dr. Lyla Glassing 11/14/2019  Palliative care: Vinie Sill, NP 8/84/1660  Cardiology: Dr. Marlou Porch 11/17/2019  Procedures:   Plain films of the left shoulder 11/14/2019  Plain films of the pelvis 11/14/2019  Chest x-ray 11/14/2019  Plain films of the left hip and pelvis 11/14/2019  Plain films of the right tib-fib 11/14/2019  Plain films of the pelvis 11/14/2019  Left hip hemiarthroplasty  per Dr. Lyla Glassing 11/14/2019  Antimicrobials:   None   Subjective: Sitting up in bed.  States she does not feel well at all today.  Denies any chest pain.  No shortness of breath.  Some complaints of palpitations.  Complaining of some difficulty swallowing.  Hard of hearing.  Events overnight noted that patient went into A. fib and has been transferred to the telemetry floor.  Heart rate improved after a dose of IV Lopressor.    Objective: Vitals:   11/17/19 0616 11/17/19 0822 11/17/19 0857 11/17/19 0954  BP: 136/64 (!) 147/73  125/85  Pulse: 74 83 100 88  Resp: 15 18  19   Temp: 98.3 F (36.8 C) 98.4 F (36.9 C)  98.2 F (36.8 C)  TempSrc: Oral Oral  Oral  SpO2:  100% 100% 100%  Weight:      Height:        Intake/Output Summary (Last 24 hours) at 11/17/2019 1213 Last data filed at 11/17/2019 0600 Gross per 24 hour  Intake 585.74 ml  Output 200 ml  Net 385.74 ml   Filed Weights   11/14/19 0645  Weight: 45 kg    Examination:  General exam: NAD Respiratory system: CTAB.  No wheezes, no crackles, no rhonchi.  Normal respiratory effort.  Speaking in full sentences.  Cardiovascular system: Irregularly irregular.  No murmurs rubs or gallops.  No JVD.  No lower extremity edema.  Gastrointestinal system: Abdomen is soft, nontender, nondistended, positive bowel sounds.  No rebound.  No guarding.   Central nervous system: Alert and oriented. No focal neurological deficits. Extremities: Symmetric 5 x 5 power. Skin: No rashes, lesions or ulcers Psychiatry: Judgement and insight appear normal. Mood & affect appropriate.     Data Reviewed: I have personally reviewed following labs and imaging studies  CBC: Recent Labs  Lab 11/14/19 0655 11/15/19 0320 11/16/19 0304 11/17/19 0302  WBC 10.7* 8.7 9.2 9.1  NEUTROABS 9.1*  --   --   --   HGB 13.1 9.4* 10.4* 9.7*  HCT 40.8 30.1* 33.7* 30.5*  MCV 91.7 93.5 94.4 92.1  PLT 272 203 226 630    Basic Metabolic Panel: Recent Labs   Lab 11/14/19 0655 11/15/19 0320 11/16/19 0304 11/17/19 0302 11/17/19 0819  NA 139 135 142 137  --   K 3.2* 4.4 4.1 3.6  --   CL 107 104 108 108  --   CO2 23 24 23 22   --   GLUCOSE 135* 166* 111* 115*  --   BUN 10 10 9 10   --   CREATININE 0.48 0.65 0.56 0.41*  --   CALCIUM 8.9 8.2* 8.6* 8.2*  --   MG 1.9  --   --   --  2.2    GFR: Estimated Creatinine Clearance: 29.9 mL/min (A) (by C-G formula based on SCr of 0.41 mg/dL (L)).  Liver Function Tests: No  results for input(s): AST, ALT, ALKPHOS, BILITOT, PROT, ALBUMIN in the last 168 hours.  CBG: No results for input(s): GLUCAP in the last 168 hours.   Recent Results (from the past 240 hour(s))  SARS Coronavirus 2 by RT PCR (hospital order, performed in [facility] hospital lab) Nasopharyngeal Nasopharyngeal Swab     Status: None   Collection Time: 11/14/19  7:44 AM   Specimen: Nasopharyngeal Swab  Result Value Ref Range Status   SARS Coronavirus 2 NEGATIVE NEGATIVE Final    Comment: (NOTE) SARS-CoV-2 target nucleic acids are NOT DETECTED.  The SARS-CoV-2 RNA is generally detectable in upper and lower respiratory specimens during the acute phase of infection. The lowest concentration of SARS-CoV-2 viral copies this assay can detect is 250 copies / mL. A negative result does not preclude SARS-CoV-2 infection and should not be used as the sole basis for treatment or other patient management decisions.  A negative result may occur with improper specimen collection / handling, submission of specimen other than nasopharyngeal swab, presence of viral mutation(s) within the areas targeted by this assay, and inadequate number of viral copies (<250 copies / mL). A negative result must be combined with clinical observations, patient history, and epidemiological information.  Fact Sheet for Patients:   StrictlyIdeas.no  Fact Sheet for Healthcare  Providers: BankingDealers.co.za  This test is not yet approved or  cleared by the [place] FDA and has been authorized for detection and/or diagnosis of SARS-CoV-2 by FDA under an Emergency Use Authorization (EUA).  This EUA will remain in effect (meaning this test can be used) for the duration of the COVID-19 declaration under Section 564(b)(1) of the Act, 21 U.S.C. section 360bbb-3(b)(1), unless the authorization is terminated or revoked sooner.  Performed at [facility], [place] [address], [place], [place] [address]   Surgical pcr screen     Status: None   Collection Time: 11/14/19 10:32 AM   Specimen: Nasal Mucosa; Nasal Swab  Result Value Ref Range Status   MRSA, PCR NEGATIVE NEGATIVE Final   Staphylococcus aureus NEGATIVE NEGATIVE Final    Comment: (NOTE) The Xpert SA Assay (FDA approved for NASAL specimens in patients 3 years of age and older), is one component of a comprehensive surveillance program. It is not intended to diagnose infection nor to guide or monitor treatment. Performed at [facility], [place] [address] [place], [place] [address]          Radiology Studies: No results found.      Scheduled Meds: . acetaminophen  500 mg Oral Q6H  . cyanocobalamin  1,000 mcg Subcutaneous Daily  . docusate sodium  100 mg Oral BID  . enoxaparin (LOVENOX) injection  30 mg Subcutaneous Q24H  . metoprolol tartrate  12.5 mg Oral BID  . polyethylene glycol  17 g Oral Daily   Continuous Infusions: . methocarbamol (ROBAXIN) IV Stopped (11/14/19 1532)     LOS: 3 days    Time spent: 35 minutes    Irine Seal, MD [facility]   To contact the attending provider between 7A-7P or the covering provider during after hours 7P-7A, please log into the web site [email or web address] and access using universal [place] password for that web site. If you do not have the password, please call the hospital  operator.  11/17/2019, 12:13 PM

## 2019-11-17 NOTE — Significant Event (Signed)
Rapid Response Event Note   Reason for Call :  Patient complaint of chest pressure, heart beating fast, the feeling of unable to take a deep breath.  Primary nurse obtained EKG that showed patient in A Fib RVR, nurse reports that heart rate is at 120.   Initial Focused Assessment:  Patient alert and oriented x 4 she is hard of hearing. She appears to be in no acute distress.  Heart rate on monitor is 122 afib, b/p 175/80. Breath sounds are clear bilat. O2 Sat's 100% on 2l/[place]. See vital sign sheet.      Interventions:  EKG Troponin x 3 series M. Sharlet Salina FNP at bedside.  NS @ 75 ml Metoprolol 5 mg x 1 Transfer to telemetry at 0700  Place patient on continuous pulse oximetry to monitor heart rate and O2 Sat's     Plan of Care:  Transfer patient to telemetry unit for new onset of A fib, heart rate controled by metoprolol at this time. Patient placed on continuous pulse oximetry until bed is available.    Event Summary Patient with new onset of A fib RVR with heart rate of 120's, complaint of mild chest pressure, fast heart rate, and feels the need to take of deep breath. O2 Sat's at 100 on 2 liters of oxygen. EKG obtain, metoprolol 5 mg given, Troponin x 3. M. Sharlet Salina FNP at bedside. Orders to transfer to patient to telemetry at 0700, during that time a continous pulse will monitor heart rate.  Primary nurse will continue to monitor and notifiy RRT of changes.   MD Notified:  M. [facility] FNP  Call Time: 0425  Arrival Time: 1779  End Time: Banning Lee-Anne Flicker MSN, RN-BC  [place]

## 2019-11-17 NOTE — Consult Note (Addendum)
Cardiology Consultation:   Patient ID: [name] [name] MRN: [id number]; DOB: October 27, 1922  Admit date: 11/14/2019 Date of Consult: 11/17/2019  Primary Care Provider: Lajean Manes, MD [facility] Cardiologist: No primary care provider on file. [facility] Electrophysiologist:  None    Patient Profile:   [name] [name] is a 86 y.o. female with a hx of HTN, HLD, and admitted after fall, and fx Lt hip who is being seen today for the evaluation of atrial fib at the request of Dr. Grandville Silos.  History of Present Illness:   [name] [name] with above hx and RBBB (dating back to 2016), was admitted 11/14/19 with fall and found to have fx of Lt hip.  She thought she tripped and fell.  In ST at 102 with RBBB and no acute ST changes.  Labs stable though K+ was 3.2 and WBC 10.7.   Ortho consulted and she underwent Lt hip hemiarthroplasty ant. Approach on 11/14/19. She also has Rt lower leg hematoma.  Pt is DNR.   She did have prolonged Qtc at one point but with resolution.  Her BP has been soft so her home amlodipine was stopped. Pt has been progressing with plans for skilled Nursing home until can return home.    This AM she was not on tele and complained of chest pain and EKG with a fib RVR -120.  Her BP was elevated 164/99  Her 02 sat at 100% on 2 L [place].  5 mg IV metoprolol given.  She was transferred to tele.   She tells me she has had rapid heart beat at times. But no chest pain.   Pt is very hard of hearing, difficult to communicate.  Hs troponin 8, 9 Na 137, K+ 3.6, BUN 10 and Cr 0.41 Mg_ 2.2  Hgb 9.7, WBC 9.1 plts 250  TSH 1.810  Iron 14, iron sat 6   EKG:  The EKG was personally reviewed and demonstrates:   in chart  A fib with LAFB and rapid HR then to SR with PACs.   Telemetry:  Telemetry was personally reviewed and demonstrates:  SR with freq PACs and bursts of PAF about 6 seconds.    No discomfort currently.  BP 147/73,  125/85 afebrile, P 90s  Past Medical History:  Diagnosis Date    . Arthritis   . Hip fracture [facility]) 2009   surgery  . History of depression   . History of gallstones   . History of IBS   . History of vertigo   . Hyperlipidemia   . Hypertension   . RBBB (right bundle branch block)     Past Surgical History:  Procedure Laterality Date  . CHOLECYSTECTOMY  2008   Dr. Harlow Asa  . [place]   hysterectomy  . TONSILECTOMY, ADENOIDECTOMY, BILATERAL MYRINGOTOMY AND TUBES     age 62 tonsils and adnoids only  . TOTAL HIP ARTHROPLASTY  02/20/2008   fractured hip  . TOTAL HIP ARTHROPLASTY Left 11/14/2019   Procedure: LEFT HIP HEMIARTHROPLASTY ANTERIOR APPROACH;  Surgeon: Rod Can, MD;  Location: [facility];  Service: Orthopedics;  Laterality: Left;     Home Medications:  Prior to Admission medications   Medication Sig Start Date End Date Taking? Authorizing Provider  acetaminophen (TYLENOL) 500 MG tablet Take 500 mg by mouth every 6 (six) hours as needed for moderate pain.   Yes [provider]  amLODipine (NORVASC) 2.5 MG tablet Take 2.5 mg by mouth daily. 10/19/19  Yes  [provider]  HYDROcodone-acetaminophen (NORCO/VICODIN) 5-325 MG tablet Take 2 tablets by mouth every 6 (six) hours as needed for pain. 10/25/19  Yes [provider]  ondansetron (ZOFRAN) 4 MG tablet Take 4 mg by mouth 2 (two) times daily as needed for nausea/vomiting. 10/12/19  Yes [provider]  aspirin (ASPIRIN CHILDRENS) 81 MG chewable tablet Chew 1 tablet (81 mg total) by mouth 2 (two) times daily. 11/15/19 12/30/19  Dorothyann Peng, PA  HYDROcodone-acetaminophen (NORCO/VICODIN) 5-325 MG tablet Take 1 tablet by mouth every 4 (four) hours as needed for up to 7 days for moderate pain (pain score 4-6). 11/15/19 11/22/19  Dorothyann Peng, PA  diltiazem (CARDIZEM CD) 120 MG 24 hr capsule Take 1 capsule (120 mg total) by mouth daily. Patient not taking: Reported on 05/13/2018 07/10/14 03/17/19  Mendel Corning, MD  esomeprazole  (NEXIUM) 40 MG capsule Take 1 capsule (40 mg total) by mouth 2 (two) times daily before a meal. Patient not taking: Reported on 05/13/2018 07/10/14 03/17/19  Rai, Vernelle Emerald, MD  metoCLOPramide (REGLAN) 5 MG tablet Take 1 tablet (5 mg total) by mouth 3 (three) times daily with meals as needed for nausea (take 70mins before meals). Patient not taking: Reported on 05/13/2018 07/10/14 03/17/19  Rai, Vernelle Emerald, MD  promethazine (PHENERGAN) 12.5 MG tablet Take 1 tablet (12.5 mg total) by mouth every 6 (six) hours as needed for nausea or vomiting. Patient not taking: Reported on 05/13/2018 07/10/14 03/17/19  Mendel Corning, MD    Inpatient Medications: Scheduled Meds: . acetaminophen  500 mg Oral Q6H  . docusate sodium  100 mg Oral BID  . enoxaparin (LOVENOX) injection  30 mg Subcutaneous Q24H  . polyethylene glycol  17 g Oral Daily  . potassium chloride  40 mEq Oral Once   Continuous Infusions: . methocarbamol (ROBAXIN) IV Stopped (11/14/19 1532)   PRN Meds: acetaminophen, haloperidol lactate, HYDROcodone-acetaminophen, HYDROcodone-acetaminophen, menthol-cetylpyridinium **OR** phenol, methocarbamol **OR** methocarbamol (ROBAXIN) IV, metoprolol tartrate, morphine injection, ondansetron **OR** ondansetron (ZOFRAN) IV  Allergies:    Allergies  Allergen Reactions  . Codeine Nausea And Vomiting  . Crestor [Rosuvastatin Calcium]     myalgias  . Escitalopram Oxalate     Abdominal problem  . Ezetimibe-Simvastatin     myalgias    Social History:   Social History   Socioeconomic History  . Marital status: Widowed    Spouse name: Not on file  . Number of children: 3  . Years of education: Not on file  . Highest education level: Not on file  Occupational History  . Occupation: Retired  Tobacco Use  . Smoking status: Never Smoker  . Smokeless tobacco: Never Used  Substance and Sexual Activity  . Alcohol use: No    Alcohol/week: 0.0 standard drinks  . Drug use: No  . Sexual activity: Not  on file  Other Topics Concern  . Not on file  Social History Narrative  . Not on file   Social Determinants of Health   Financial Resource Strain:   . Difficulty of Paying Living Expenses: Not on file  Food Insecurity:   . Worried About Charity fundraiser in the Last Year: Not on file  . Ran Out of Food in the Last Year: Not on file  Transportation Needs:   . Lack of Transportation (Medical): Not on file  . Lack of Transportation (Non-Medical): Not on file  Physical Activity:   . Days of Exercise per Week: Not on file  .  Minutes of Exercise per Session: Not on file  Stress:   . Feeling of Stress : Not on file  Social Connections:   . Frequency of Communication with Friends and Family: Not on file  . Frequency of Social Gatherings with Friends and Family: Not on file  . Attends Religious Services: Not on file  . Active Member of Clubs or Organizations: Not on file  . Attends Archivist Meetings: Not on file  . Marital Status: Not on file  Intimate Partner Violence:   . Fear of Current or Ex-Partner: Not on file  . Emotionally Abused: Not on file  . Physically Abused: Not on file  . Sexually Abused: Not on file    Family History:    Family History  Problem Relation Age of Onset  . Tuberculosis Mother        very young age  . Other Father        suicide  . Colon cancer Sister        colon  . Kidney disease Brother   . Heart disease Brother   . Colon polyps Neg Hx   . Diabetes Neg Hx   . Esophageal cancer Neg Hx      ROS:  Please see the history of present illness.  General:no colds or fevers, no weight changes Skin:no rashes or ulcers HEENT:no blurred vision, no congestion CV:see HPI PUL:see HPI GI:no diarrhea constipation or melena, no indigestion GU:no hematuria, no dysuria MS:no joint pain, no claudication Neuro:no syncope, + lightheadedness at times at home Endo:no diabetes, no thyroid disease  All other ROS reviewed and negative.      Physical Exam/Data:   Vitals:   11/17/19 0616 11/17/19 0822 11/17/19 0857 11/17/19 0954  BP: 136/64 (!) 147/73  125/85  Pulse: 74 83 100 88  Resp: 15 18    Temp: 98.3 F (36.8 C) 98.4 F (36.9 C)  98.2 F (36.8 C)  TempSrc: Oral Oral  Oral  SpO2:  100% 100% 100%  Weight:      Height:        Intake/Output Summary (Last 24 hours) at 11/17/2019 0957 Last data filed at 11/17/2019 0600 Gross per 24 hour  Intake 705.74 ml  Output 200 ml  Net 505.74 ml   Last 3 Weights 11/14/2019 05/13/2018 07/18/2014  Weight (lbs) 99 lb 3.3 oz 100 lb 119 lb 4 oz  Weight (kg) 45 kg 45.36 kg 54.091 kg     Body mass index is 19.38 kg/m.  General:  Frail female  in no acute distress resting comfortably HEENT: extremely hard of hearing. Lymph: no adenopathy Neck: no JVD Endocrine:  No thryomegaly Vascular: No carotid bruits; pedal pulses 1+ bilaterally  Cardiac:  normal S1, S2; RRR; no murmur gallup rub or click Lungs:  clear to auscultation bilaterally, no wheezing, rhonchi or rales  Abd: soft, nontender, no hepatomegaly  Ext: no edema Musculoskeletal: lower Rt leg wrapped, BUE and BLE strength normal and equal Skin: warm and dry  Neuro:  Alert and oriented X 3 MAE follows commands, no focal abnormalities noted Psych:  Normal affect   Relevant CV Studies: None   Echo ordered.   Laboratory Data:  High Sensitivity Troponin:   Recent Labs  Lab 11/17/19 0312 11/17/19 0713  TROPONINIHS 8 9     Chemistry Recent Labs  Lab 11/15/19 0320 11/16/19 0304 11/17/19 0302  NA 135 142 137  K 4.4 4.1 3.6  CL 104 108 108  CO2 24 23 22  GLUCOSE 166* 111* 115*  BUN 10 9 10   CREATININE 0.65 0.56 0.41*  CALCIUM 8.2* 8.6* 8.2*  GFRNONAA >60 >60 >60  GFRAA >60 >60 >60  ANIONGAP 7 11 7     No results for input(s): PROT, ALBUMIN, AST, ALT, ALKPHOS, BILITOT in the last 168 hours. Hematology Recent Labs  Lab 11/15/19 0320 11/16/19 0304 11/17/19 0302  WBC 8.7 9.2 9.1  RBC 3.22* 3.57* 3.31*   HGB 9.4* 10.4* 9.7*  HCT 30.1* 33.7* 30.5*  MCV 93.5 94.4 92.1  MCH 29.2 29.1 29.3  MCHC 31.2 30.9 31.8  RDW 12.7 12.9 13.0  PLT 203 226 250   BNPNo results for input(s): BNP, PROBNP in the last 168 hours.  DDimer No results for input(s): DDIMER in the last 168 hours.   Radiology/Studies:  DG Chest 1 View  Result Date: 11/14/2019 CLINICAL DATA:  Fall.  Severe left hip pain. EXAM: CHEST  1 VIEW COMPARISON:  Chest x-ray dated January 26, 2010. FINDINGS: The heart size and mediastinal contours are within normal limits. Normal pulmonary vascularity. Insert elevation of the right hemidiaphragm. No acute osseous abnormality. 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 IMPRESSION: 1. No active disease. Electronically Signed   By: Titus Dubin M.D.   On: 11/14/2019 08:07   DG Tibia/Fibula Right  Result Date: 11/14/2019 CLINICAL DATA:  Suspected hip fracture.  RIGHT lower leg pain. EXAM: RIGHT TIBIA AND FIBULA - 2 VIEW COMPARISON:  None FINDINGS: Osteopenia. Degenerative changes about the RIGHT knee incidentally noted, incompletely imaged. No signs of acute fracture or dislocation. IMPRESSION: 1. No signs of acute fracture or dislocation. 2. Osteopenia and degenerative changes. Electronically Signed   By: Zetta Bills M.D.   On: 11/14/2019 08:01   Pelvis Portable  Result Date: 11/14/2019 CLINICAL DATA:  Status post left hip arthroplasty. EXAM: PORTABLE PELVIS 1-2 VIEWS COMPARISON:  Left hip radiographs and intraoperative fluoroscopic images from 11/14/2019 FINDINGS: Sequelae of left hip hemiarthroplasty are again identified with postoperative gas in the surrounding soft tissues and skin staples in place. The prosthetic femoral head is approximated with the native acetabulum on this single image. No acute fracture is identified. Remote right femoral ORIF is again noted. IMPRESSION: Left hip hemiarthroplasty as above. Electronically Signed   By: Logan Bores  M.D.   On: 11/14/2019 14:33   DG Shoulder Left Port  Result Date: 11/14/2019 CLINICAL DATA:  Left shoulder pain after fall. EXAM: LEFT SHOULDER COMPARISON:  None. FINDINGS: No acute fracture or dislocation. Old healed left fifth and sixth rib fractures. Mild acromioclavicular degenerative changes with marginal spurring. Osteopenia. Soft tissues are unremarkable. IMPRESSION: 1. No acute osseous abnormality. Electronically Signed   By: Titus Dubin M.D.   On: 11/14/2019 14:31   DG C-Arm 1-60 Min-No Report  Result Date: 11/14/2019 Fluoroscopy was utilized by the requesting physician.  No radiographic interpretation.   DG HIP OPERATIVE UNILAT W OR W/O PELVIS LEFT  Result Date: 11/14/2019 CLINICAL DATA:  Elective surgery EXAM: OPERATIVE LEFT HIP (WITH PELVIS IF PERFORMED) 2 VIEWS TECHNIQUE: Fluoroscopic spot image(s) were submitted for interpretation post-operatively. COMPARISON:  11/14/2019 FLUOROSCOPY TIME 0 minutes 10 seconds Dose: 0.8511 mGy Images: 2 FINDINGS: Osseous demineralization. LEFT hip prosthesis. No acute fracture, dislocation, or bone destruction. IMPRESSION: LEFT hip prosthesis without acute complication. Electronically Signed   By: Lavonia Dana M.D.   On: 11/14/2019 13:25   DG Hip Unilat W or Wo Pelvis 2-3 Views Left  Result Date: 11/14/2019 CLINICAL DATA:  Suspected hip fracture patient from home with un witnessed fall complaining of severe LEFT hip pain EXAM: DG HIP (WITH OR WITHOUT PELVIS) 2-3V LEFT COMPARISON:  May 13, 2018 FINDINGS: Signs of displaced and angulated fracture with varus deformity of the LEFT femoral neck. Superior migration of the LEFT femur. Femoral head remains within the acetabulum. Evidence of prior RIGHT femoral ORIF. Some anterior displacement of distal femur relative to femoral head on the cross-table lateral. Evidence of fracture, previous fracture of the RIGHT inferior pubic ramus. IMPRESSION: 1. Signs of displaced and angulated fracture of the LEFT  femoral neck. 2. Prior RIGHT femoral ORIF. 3. Signs of prior fracture of the RIGHT inferior pubic ramus. Electronically Signed   By: Zetta Bills M.D.   On: 11/14/2019 07:52       HEAR Score (for undifferentiated chest pain):     4     Assessment and Plan:   1. A fib with RVR new onset.  + chest pain associated with rapid HR and neg troponin, no ST changes on EKG.  Echo ordered, will see EF  And WMA,  With her age most likely CAD, but unless recurrent pain would hold on ischemic eval.    cha2DS2VAsc 4, with age, HTN female -- though short episodes of PAF.  Will add BB for now to prevent more episodes with thought to BB vs dilt at discharge.  Dr. Marlou Porch to see. 2. fx Lt hip with Left hip hemiarthroplasty, anterior approach.  POD # 3 per ortho 3. HTN was on amlodipine prior to admit held for lower BP but has improved.  Will do BB for now.        For questions or updates, please contact [place] Please consult [email or web address] for contact info under    Signed, Cecilie Kicks, NP  11/17/2019 9:57 AM   Personally seen and examined. Agree with above.  No longer in AFIB. Doing better. No SOB, no CP. Nursing student currently in with her.  Agree with low dose Bb. Will continue. No anticoagulation given short nature of AFIB and inciting event of HIP FX.   GEN: pleasant elderly, in no acute distress  HEENT: normal  Neck: no JVD, carotid bruits, or masses Cardiac: RRR; soft systolic murmur, no rubs, or gallops,no edema  Respiratory:  clear to auscultation bilaterally, normal work of breathing GI: soft, nontender, nondistended, + BS MS: no deformity or atrophy  Skin: warm and dry, no rash Neuro:  Alert and Oriented x 3, Strength and sensation are intact Psych: euthymic mood, full affect   Will check on her tomorrow ECHO prelim looks normal EF.   Candee Furbish, MD

## 2019-11-17 NOTE — TOC Progression Note (Signed)
Transition of Care [facility]) - Progression Note    Patient Details  Name: [name] [name] MRN: [id number] Date of Birth: 1922-07-22  [facility] CM/SW Contact  Lennart Pall, LCSW Phone Number: 11/17/2019, 9:56 AM  Clinical Narrative:    Pt transferred to another unit due to medical issues.  Will update TOC covering to d/c plans at time of transfer.  Pt and family had accepted SNF bed at [facility] and dc was planned for today.  Have alerted facility to pt status today.    Expected Discharge Plan: [place] Barriers to Discharge: Continued Medical Work up  Expected Discharge Plan and Services Expected Discharge Plan: [place]   Discharge Planning Services: CM Consult Post Acute Care Choice: [place] Living arrangements for the past 2 months: Single Family Home                 DME Arranged: N/A DME Agency: NA       HH Arranged: NA HH Agency: NA         Social Determinants of Health (SDOH) Interventions    Readmission Risk Interventions No flowsheet data found.

## 2019-11-17 NOTE — TOC Progression Note (Signed)
Transition of Care [facility] - Progression Note    Patient Details  Name: [name] [name] MRN: [id number] Date of Birth: [birth year]  [facility] of [facility]) CM/SW Contact  Joliana Claflin, Juliann Pulse, RN Phone Number: 11/17/2019, 10:24 AM  Clinical Narrative: Patient transferred for tele monitoring after rapid response. TC Jane(dtr n law/HCPOA) [phone]-updated on SNF-[facility] awaiting medical stability-will need new covid 24-48hrs prior d/c. MD updated.     Expected Discharge Plan: [place] Barriers to Discharge: Continued Medical Work up  Expected Discharge Plan and Services Expected Discharge Plan: [place]   Discharge Planning Services: CM Consult Post Acute Care Choice: [place] Living arrangements for the past 2 months: Single Family Home                 DME Arranged: N/A DME Agency: NA       HH Arranged: NA HH Agency: NA         Social Determinants of Health (SDOH) Interventions    Readmission Risk Interventions No flowsheet data found.

## 2019-11-17 NOTE — Progress Notes (Signed)
Ardith Dark NP at bedside assessing pt.

## 2019-11-17 NOTE — Progress Notes (Signed)
   11/17/19 0419  Assess: MEWS Score  Temp 98.1 F (36.7 C)  BP (!) 164/99  Pulse Rate (!) 113  Level of Consciousness Alert  O2 Device Nasal Cannula  Assess: MEWS Score  MEWS Temp 0  MEWS Systolic 0  MEWS Pulse 2  MEWS RR 0  MEWS LOC 0  MEWS Score 2  MEWS Score Color Yellow  Assess: if the MEWS score is Yellow or Red  Were vital signs taken at a resting state? Yes  Focused Assessment Change from prior assessment (see assessment flowsheet)  Early Detection of Sepsis Score *See Row Information* Low  MEWS guidelines implemented *See Row Information* Yes  Treat  MEWS Interventions Administered prn meds/treatments;Other (Comment) (RRN called and provider on call at bedside assessing)  Escalate  MEWS: Escalate Yellow: discuss with charge nurse/RN and consider discussing with provider and RRT  Notify: Charge Nurse/RN  Name of Charge Nurse/RN Notified Laruth Bouchard  Date Charge Nurse/RN Notified 11/17/19  Time Charge Nurse/RN Notified [id number]  Notify: Provider  Provider Name/Title M. Sharlet Salina, NP  Date Provider Notified 11/17/19  Time Provider Notified [phone]  Notification Type Page  Notification Reason Change in status  Response See new orders  Date of Provider Response 11/17/19  Time of Provider Response [id number]  Notify: Rapid Response  Name of Rapid Response RN Notified Pamala Hurry, RN  Date Rapid Response Notified 11/17/19  Time Rapid Response Notified [id number]  Document  Patient Outcome Stabilized after interventions;Other (Comment) (pt waiting bed in tele for further monitoring)  Progress note created (see row info) Yes

## 2019-11-17 NOTE — Progress Notes (Signed)
  Echocardiogram 2D Echocardiogram has been performed.  [name] [name] 11/17/2019, 12:11 PM

## 2019-11-18 ENCOUNTER — Inpatient Hospital Stay (HOSPITAL_COMMUNITY): Payer: Medicare Other

## 2019-11-18 LAB — CBC
HCT: 28.2 % — ABNORMAL LOW (ref 36.0–46.0)
Hemoglobin: 9 g/dL — ABNORMAL LOW (ref 12.0–15.0)
MCH: 29.1 pg (ref 26.0–34.0)
MCHC: 31.9 g/dL (ref 30.0–36.0)
MCV: 91.3 fL (ref 80.0–100.0)
Platelets: 298 10*3/uL (ref 150–400)
RBC: 3.09 MIL/uL — ABNORMAL LOW (ref 3.87–5.11)
RDW: 13 % (ref 11.5–15.5)
WBC: 7.9 10*3/uL (ref 4.0–10.5)
nRBC: 0 % (ref 0.0–0.2)

## 2019-11-18 LAB — BASIC METABOLIC PANEL
Anion gap: 10 (ref 5–15)
BUN: 12 mg/dL (ref 8–23)
CO2: 21 mmol/L — ABNORMAL LOW (ref 22–32)
Calcium: 8.7 mg/dL — ABNORMAL LOW (ref 8.9–10.3)
Chloride: 107 mmol/L (ref 98–111)
Creatinine, Ser: 0.42 mg/dL — ABNORMAL LOW (ref 0.44–1.00)
GFR calc Af Amer: >60 mL/min (ref >60)
GFR calc non Af Amer: >60 mL/min (ref >60)
Glucose, Bld: 91 mg/dL (ref 70–99)
Potassium: 3.5 mmol/L (ref 3.5–5.1)
Sodium: 138 mmol/L (ref 135–145)

## 2019-11-18 LAB — MAGNESIUM: Magnesium: 2 mg/dL (ref 1.7–2.4)

## 2019-11-18 MED ORDER — SODIUM CHLORIDE 0.9 % IV SOLN
510.0000 mg | Freq: Once | INTRAVENOUS | Status: AC
  Administered 2019-11-18: 510 mg via INTRAVENOUS
  Filled 2019-11-18: qty 510

## 2019-11-18 MED ORDER — POTASSIUM CHLORIDE 20 MEQ PO PACK
40.0000 meq | PACK | Freq: Once | ORAL | Status: DC
  Filled 2019-11-18: qty 2

## 2019-11-18 MED ORDER — POTASSIUM CHLORIDE CRYS ER 20 MEQ PO TBCR
40.0000 meq | EXTENDED_RELEASE_TABLET | Freq: Once | ORAL | Status: AC
  Administered 2019-11-18: 40 meq via ORAL
  Filled 2019-11-18: qty 2

## 2019-11-18 MED ORDER — METOPROLOL TARTRATE 25 MG PO TABS
25.0000 mg | ORAL_TABLET | Freq: Two times a day (BID) | ORAL | Status: DC
  Administered 2019-11-18 – 2019-11-22 (×9): 25 mg via ORAL
  Filled 2019-11-18 (×9): qty 1

## 2019-11-18 MED ORDER — APIXABAN 2.5 MG PO TABS: 2.5000 mg | ORAL_TABLET | Freq: Two times a day (BID) | ORAL | 0 refills | Status: DC

## 2019-11-18 NOTE — Progress Notes (Signed)
Occupational Therapy Progress Note  Patient agreeable to getting up to chair for breakfast. Patient min A to elevate trunk to sitting. Attempt to don socks, increased difficulty getting over toes and reports mild dizziness with bending over, provide total A to don. Patient mod A with walker to transfer bedside commode with max cues for safety + sequencing. Patient able to thread mesh underwear seated with assist to pull up over hips in standing and min A for peri care for thoroughness. Patient mod A to stand pivot from commode to recliner, poor balance and difficulty utilizing UEs due to sore shoulders pt reports R is worse than L. Patient hopeful she will get to go to [facility] today.    11/18/19 1100  OT Visit Information  Last OT Received On 11/18/19  Assistance Needed +2 (chair follow)  History of Present Illness 86 year old female admitted after fall resulting in L femoral neck fracture. Patient s/p L hip hemi direct anterior approach 11/14/19. PMH compression fx 07/2019, OA, vertigo, RBBB  Precautions  Precautions Fall  Pain Assessment  Pain Assessment Faces  Faces Pain Scale 4  Pain Location L hip  Pain Descriptors / Indicators Sore  Pain Intervention(s) Monitored during session  Cognition  Arousal/Alertness Awake/alert  Behavior During Therapy WFL for tasks assessed/performed  Overall Cognitive Status Within Functional Limits for tasks assessed  ADL  Overall ADL's  Needs assistance/impaired  Grooming Wash/dry face;Wash/dry hands;Set up;Sitting  Lower Body Bathing Moderate assistance;Sit to/from stand;Sitting/lateral leans  Lower Body Bathing Details (indicate cue type and reason) seated on commode  Lower Body Dressing Moderate assistance;Sitting/lateral leans;Sit to/from stand  Lower Body Dressing Details (indicate cue type and reason) increased difficulty with donning socks, feels dizzy with prolonged bending over. able to don clean mesh underwear while seated on commode,  assist to pull up over hips  Toilet Transfer Moderate assistance;Cueing for safety;Cueing for sequencing;Stand-pivot;RW;BSC  Toileting- Clothing Manipulation and Hygiene Moderate assistance;Sit to/from stand;Sitting/lateral lean  Toileting - Clothing Manipulation Details (indicate cue type and reason) patient able to complete most of peri care seated on commode, min A for thoroughness in standing, mod A to pull udnerwear over hips due to posterior lean  Functional mobility during ADLs Moderate assistance;Cueing for safety;Cueing for sequencing;Rolling walker  Bed Mobility  Overal bed mobility Needs Assistance  Bed Mobility Supine to Sit  Supine to sit Min assist  General bed mobility comments min A to elevate trunk upright   Balance  Overall balance assessment Needs assistance;History of Falls  Sitting-balance support Feet supported  Sitting balance-Leahy Scale Fair  Sitting balance - Comments able to lift UEs to try and don socks without LOB  Standing balance support Bilateral upper extremity supported;During functional activity  Standing balance-Leahy Scale Poor  Standing balance comment BUE support and therapist assist  Transfers  Overall transfer level Needs assistance  Equipment used Rolling walker (2 wheeled)  Transfers Sit to/from Stand;Stand Pivot Transfers  Sit to Stand Mod assist  Stand pivot transfers Mod assist  General transfer comment mod A to stabilize, max cues for technique with walker  General Comments  General comments (skin integrity, edema, etc.) BP seated in chair after report of feeling "a little dizzy" 149/85   OT - End of Session  Equipment Utilized During Treatment Rolling walker  Activity Tolerance Patient tolerated treatment well  Patient left in chair;with call bell/phone within reach;with chair alarm set  Nurse Communication Mobility status  OT Assessment/Plan  OT Plan Discharge plan remains appropriate  OT Visit Diagnosis Other abnormalities of gait  and mobility (R26.89);History of falling (Z91.81);Muscle weakness (generalized) (M62.81);Pain  Pain - Right/Left Left  Pain - part of body Hip  OT Frequency (ACUTE ONLY) Min 2X/week  Follow Up Recommendations SNF  OT Equipment Other (comment) (defer to next venue)  AM-PAC OT "6 Clicks" Daily Activity Outcome Measure (Version 2)  Help from another person eating meals? 3  Help from another person taking care of personal grooming? 3  Help from another person toileting, which includes using toliet, bedpan, or urinal? 2  Help from another person bathing (including washing, rinsing, drying)? 2  Help from another person to put on and taking off regular upper body clothing? 3  Help from another person to put on and taking off regular lower body clothing? 2  6 Click Score 15  OT Goal Progression  Progress towards OT goals Progressing toward goals  Acute Rehab OT Goals  Patient Stated Goal Less pain. Regain independence  OT Goal Formulation With patient  Time For Goal Achievement 11/29/19  Potential to Achieve Goals Good  ADL Goals  Pt Will Perform Lower Body Dressing with min assist;with adaptive equipment;sit to/from stand;sitting/lateral leans  Pt Will Transfer to Toilet with min guard assist;ambulating;bedside commode (walker)  Pt Will Perform Toileting - Clothing Manipulation and hygiene with min guard assist;sitting/lateral leans;sit to/from stand  Additional ADL Goal #1 Patient will require min G assist with dynamic standing balance for 5 minutes in order to participate in self care tasks.  OT Time Calculation  OT Start Time (ACUTE ONLY) 0752  OT Stop Time (ACUTE ONLY) 0831  OT Time Calculation (min) 39 min  OT General Charges  $OT Visit 1 Visit  OT Treatments  $Self Care/Home Management  38-52 mins   Delbert Phenix OT OT pager: [phone]

## 2019-11-18 NOTE — Evaluation (Addendum)
Clinical/Bedside Swallow Evaluation Patient Details  Name: [name] [name] MRN: [id number] Date of Birth: 08/28/1922  Today's Date: 11/18/2019 Time: SLP Start Time (ACUTE ONLY): 24 SLP Stop Time (ACUTE ONLY): 0940 SLP Time Calculation (min) (ACUTE ONLY): 20 min  Past Medical History:  Past Medical History:  Diagnosis Date  . Arthritis   . Hip fracture [facility]) 2009   surgery  . History of depression   . History of gallstones   . History of IBS   . History of vertigo   . Hyperlipidemia   . Hypertension   . RBBB (right bundle branch block)    Past Surgical History:  Past Surgical History:  Procedure Laterality Date  . CHOLECYSTECTOMY  2008   Dr. Harlow Asa  . [place]   hysterectomy  . TONSILECTOMY, ADENOIDECTOMY, BILATERAL MYRINGOTOMY AND TUBES     age 29 tonsils and adnoids only  . TOTAL HIP ARTHROPLASTY  02/20/2008   fractured hip  . TOTAL HIP ARTHROPLASTY Left 11/14/2019   Procedure: LEFT HIP HEMIARTHROPLASTY ANTERIOR APPROACH;  Surgeon: Rod Can, MD;  Location: [facility];  Service: Orthopedics;  Laterality: Left;   HPI:  86yo female admitted 11/14/19 with fall at left hip pain. PMH: Right hip fx, HTN, HLD, depression, gall stones, IBS, vertigo, RBBB, HOH. Lungs CTA, CXR negative. Pt reports globus sensation, and exhibits frequent belching during meal   Assessment / Plan / Recommendation Clinical Impression   Pt was seen at bedside for swallow assessment. Pt was seated upright in recliner, exhibiting mild vomiting upon arrival of SLP. Pt reports globus sensation and exhibits frequent belching. CN exam unremarkable.   Pt's upper dentures were lying on her tray. No denture cup was available, so dentures were placed in a styrofoam cup and labelled as "DENTURES - DO NOT THROW AWAY" in several locations around the cup. ID stickers also placed on the cup.  Pt aware, RN informed.  Pt accepted trials of thin liquid via cup and straw, banana, and saltine cracker. No  obvious oral issues were noted, and there were no overt s/s aspiration on any texture. Pt reported globus sensation and frequently burped during PO trials. She states foods get "hung". Pt presentation and report raise suspicion for esophageal dysmotility. She does not have a history of GERD, and does not report taking a PPI, however, she is 86 years old and presyesophagus is highly likely. Recommend completion of a regular barium swallow (esophagram) to evaluate esophageal motility. SLP will follow up after results are available for education. In the interim, SLP provided written behavioral and dietary strategies for management of esophageal dysmotility and encouraged adherence to them. RN and MD informed.  Behavioral and Dietary Strategies for Management of Esophageal Dysmotility 1. Take reflux medications 30+ minutes before other po intake, in the morning 2. Begin meals with warm beverage 3. Eat smaller more frequent meals 4. Eat slowly, taking small bites and sips 5. Alternate solids and liquids 6. Avoid foods/liquids that increase acid production 7. Sit upright during and for 30+ minutes after meals to facilitate esophageal clearing  SLP Visit Diagnosis: Dysphagia, unspecified (R13.10)    Aspiration Risk  Mild aspiration risk;Risk for inadequate nutrition/hydration    Diet Recommendation Dysphagia 3 (Mech soft)   Liquid Administration via: Cup;Straw Medication Administration:  (per pt preference) Supervision: Patient able to self feed;Intermittent supervision to cue for compensatory strategies Compensations: Minimize environmental distractions;Small sips/bites;Slow rate;Other (Comment) (reflux precautions) Postural Changes: Seated upright at 90 degrees;Remain upright for at least 30  minutes after po intake    Other  Recommendations Recommended Consults: Consider esophageal assessment Oral Care Recommendations: Oral care BID   Follow up Recommendations Skilled Nursing facility       Frequency and Duration min 1 x/week  1 week       Prognosis Prognosis for Safe Diet Advancement: Good      Swallow Study   General Date of Onset: 11/14/19 HPI: 86yo female admitted 11/14/19 with fall at left hip pain. PMH: Right hip fx, HTN, HLD, depression, gall stones, IBS, vertigo, RBBB, HOH. Lungs CTA, CXR negative. Pt reports globus sensation, and exhibits frequent belching during meal Type of Study: Bedside Swallow Evaluation Previous Swallow Assessment: none Diet Prior to this Study: Dysphagia 3 (soft) Temperature Spikes Noted: No Respiratory Status: Room air History of Recent Intubation: No Behavior/Cognition: Cooperative;Alert;Pleasant mood Oral Cavity Assessment: Within Functional Limits Oral Care Completed by SLP: No Oral Cavity - Dentition: Dentures, top Vision: Functional for self-feeding Self-Feeding Abilities: Able to feed self Patient Positioning: Upright in chair Baseline Vocal Quality: Normal Volitional Cough: Strong Volitional Swallow: Able to elicit    Oral/Motor/Sensory Function Overall Oral Motor/Sensory Function: Within functional limits   Ice Chips Ice chips: Not tested   Thin Liquid Thin Liquid: Within functional limits Presentation: Cup;Self Fed;Straw    Nectar Thick Nectar Thick Liquid: Not tested   Honey Thick Honey Thick Liquid: Not tested   Puree Puree: Within functional limits Presentation: Self Fed;Spoon   Solid     Solid: Within functional limits Presentation: [place] B. Quentin Ore, [facility], [place] Speech Language Pathologist Office: [phone] Pager: [phone]  Shonna Chock 11/18/2019,9:51 AM

## 2019-11-18 NOTE — Progress Notes (Addendum)
Progress Note  Patient Name: [name] [name] Date of Encounter: 11/18/2019  [facility] Cardiologist: Candee Furbish, MD   Subjective   No chest pain or SOB sitting up in chair for breakfast   Inpatient Medications    Scheduled Meds: . acetaminophen  500 mg Oral Q6H  . cyanocobalamin  1,000 mcg Subcutaneous Daily  . docusate sodium  100 mg Oral BID  . enoxaparin (LOVENOX) injection  30 mg Subcutaneous Q24H  . metoprolol tartrate  12.5 mg Oral BID  . pantoprazole  40 mg Oral Q0600  . polyethylene glycol  17 g Oral Daily  . potassium chloride  40 mEq Oral Once   Continuous Infusions: . ferumoxytol    . methocarbamol (ROBAXIN) IV Stopped (11/14/19 1532)   PRN Meds: acetaminophen, haloperidol lactate, HYDROcodone-acetaminophen, HYDROcodone-acetaminophen, menthol-cetylpyridinium **OR** phenol, methocarbamol **OR** methocarbamol (ROBAXIN) IV, metoprolol tartrate, morphine injection, ondansetron **OR** ondansetron (ZOFRAN) IV   Vital Signs    Vitals:   11/17/19 1313 11/17/19 1814 11/17/19 2050 11/18/19 0507  BP: 122/72 (!) 137/59 (!) 170/84 139/61  Pulse: 77 77 92 65  Resp: 20 20 18 18   Temp: 97.9 F (36.6 C) 99.1 F (37.3 C) 98.8 F (37.1 C) 98.7 F (37.1 C)  TempSrc: Oral Oral Oral Oral  SpO2: 97% 96% 98% 95%  Weight:      Height:        Intake/Output Summary (Last 24 hours) at 11/18/2019 0812 Last data filed at 11/18/2019 0330 Gross per 24 hour  Intake 480 ml  Output --  Net 480 ml   Last 3 Weights 11/14/2019 05/13/2018 07/18/2014  Weight (lbs) 99 lb 3.3 oz 100 lb 119 lb 4 oz  Weight (kg) 45 kg 45.36 kg 54.091 kg      Telemetry    Bursts of SVT   - Personally Reviewed  ECG    No new - Personally Reviewed  Physical Exam   GEN: No acute distress.   Neck: No JVD up in chair Cardiac: RRR, occ rapid beats, no murmurs, rubs, or gallops.  Respiratory: few rhonchi to auscultation bilaterally. GI: Soft, nontender, non-distended  MS: No edema; No  deformity. Neuro:  Nonfocal  Psych: Normal affect   Labs    High Sensitivity Troponin:   Recent Labs  Lab 11/17/19 0312 11/17/19 0713  TROPONINIHS 8 9      Chemistry Recent Labs  Lab 11/16/19 0304 11/17/19 0302 11/18/19 0516  NA 142 137 138  K 4.1 3.6 3.5  CL 108 108 107  CO2 23 22 21*  GLUCOSE 111* 115* 91  BUN 9 10 12   CREATININE 0.56 0.41* 0.42*  CALCIUM 8.6* 8.2* 8.7*  GFRNONAA >60 >60 >60  GFRAA >60 >60 >60  ANIONGAP 11 7 10      Hematology Recent Labs  Lab 11/16/19 0304 11/17/19 0302 11/18/19 0516  WBC 9.2 9.1 7.9  RBC 3.57* 3.31* 3.09*  HGB 10.4* 9.7* 9.0*  HCT 33.7* 30.5* 28.2*  MCV 94.4 92.1 91.3  MCH 29.1 29.3 29.1  MCHC 30.9 31.8 31.9  RDW 12.9 13.0 13.0  PLT 226 250 298    BNPNo results for input(s): BNP, PROBNP in the last 168 hours.   DDimer No results for input(s): DDIMER in the last 168 hours.   Radiology    ECHOCARDIOGRAM COMPLETE  Result Date: 11/17/2019    ECHOCARDIOGRAM REPORT   Patient Name:   [name] [name] Date of Exam: 11/17/2019 Medical Rec #:  [id number]    Height:  60.0 in Accession #:    [id number]   Weight:       99.2 lb Date of Birth:  [birth year]    BSA:          1.386 m Patient Age:    86 years     BP:           147/73 mmHg Patient Gender: F            HR:           89 bpm. Exam Location:  Inpatient Procedure: 2D Echo, Cardiac Doppler and Color Doppler Indications:    I48.0 Paroxysmal atrial fibrillation  History:        Patient has no prior history of Echocardiogram examinations.                 Arrythmias:RBBB; Risk Factors:Hypertension and Dyslipidemia.  Sonographer:    Jonelle Sidle Dance Referring Phys: [place]  1. Left ventricular ejection fraction, by estimation, is 60 to 65%. The left ventricle has normal function. The left ventricle has no regional wall motion abnormalities. There is mild left ventricular hypertrophy. Left ventricular diastolic parameters are consistent with Grade I diastolic  dysfunction (impaired relaxation).  2. Right ventricular systolic function is normal. The right ventricular size is normal. There is normal pulmonary artery systolic pressure.  3. The mitral valve is abnormal. Mild mitral valve regurgitation.  4. The aortic valve is abnormal. Aortic valve regurgitation is not visualized. Mild aortic valve sclerosis is present, with no evidence of aortic valve stenosis.  5. The inferior vena cava is normal in size with greater than 50% respiratory variability, suggesting right atrial pressure of 3 mmHg. FINDINGS  Left Ventricle: Left ventricular ejection fraction, by estimation, is 60 to 65%. The left ventricle has normal function. The left ventricle has no regional wall motion abnormalities. The left ventricular internal cavity size was normal in size. There is  mild left ventricular hypertrophy. Left ventricular diastolic parameters are consistent with Grade I diastolic dysfunction (impaired relaxation). Right Ventricle: The right ventricular size is normal. No increase in right ventricular wall thickness. Right ventricular systolic function is normal. There is normal pulmonary artery systolic pressure. The tricuspid regurgitant velocity is 2.65 m/s, and  with an assumed right atrial pressure of 3 mmHg, the estimated right ventricular systolic pressure is 42.5 mmHg. Left Atrium: Left atrial size was normal in size. Right Atrium: Right atrial size was normal in size. Pericardium: There is no evidence of pericardial effusion. Mitral Valve: The mitral valve is abnormal. Mild mitral annular calcification. Mild mitral valve regurgitation. Tricuspid Valve: The tricuspid valve is grossly normal. Tricuspid valve regurgitation is mild. Aortic Valve: The aortic valve is abnormal. Aortic valve regurgitation is not visualized. Mild aortic valve sclerosis is present, with no evidence of aortic valve stenosis. Pulmonic Valve: The pulmonic valve was not well visualized. Pulmonic valve  regurgitation is not visualized. Aorta: The aortic root and ascending aorta are structurally normal, with no evidence of dilitation. Venous: The inferior vena cava is normal in size with greater than 50% respiratory variability, suggesting right atrial pressure of 3 mmHg. IAS/Shunts: The interatrial septum was not assessed.  LEFT VENTRICLE PLAX 2D LVIDd:         3.90 cm LVIDs:         2.60 cm LV PW:         0.90 cm LV IVS:        1.40 cm LVOT diam:  1.80 cm LV SV:         49 LV SV Index:   35 LVOT Area:     2.54 cm  RIGHT VENTRICLE          IVC RV Basal diam:  2.60 cm  IVC diam: 1.40 cm TAPSE (M-mode): 1.8 cm LEFT ATRIUM             Index       RIGHT ATRIUM          Index LA diam:        4.00 cm 2.89 cm/m  RA Area:     7.78 cm LA Vol (A2C):   49.5 ml 35.72 ml/m RA Volume:   11.70 ml 8.44 ml/m LA Vol (A4C):   59.3 ml 42.80 ml/m LA Biplane Vol: 53.9 ml 38.90 ml/m  AORTIC VALVE LVOT Vmax:   89.65 cm/s LVOT Vmean:  59.200 cm/s LVOT VTI:    0.192 m  AORTA Ao Root diam: 3.10 cm Ao Asc diam:  2.90 cm MITRAL VALVE                TRICUSPID VALVE MV Area (PHT): 3.65 cm     TR Peak grad:   28.1 mmHg MV Decel Time: 208 msec     TR Vmax:        265.00 cm/s MV E velocity: 100.00 cm/s MV A velocity: 98.10 cm/s   SHUNTS MV E/A ratio:  1.02         Systemic VTI:  0.19 m                             Systemic Diam: 1.80 cm Dorris Carnes MD Electronically signed by Dorris Carnes MD Signature Date/Time: 11/17/2019/4:44:49 PM    Final     Cardiac Studies   IMPRESSIONS    1. Left ventricular ejection fraction, by estimation, is 60 to 65%. The  left ventricle has normal function. The left ventricle has no regional  wall motion abnormalities. There is mild left ventricular hypertrophy.  Left ventricular diastolic parameters  are consistent with Grade I diastolic dysfunction (impaired relaxation).  2. Right ventricular systolic function is normal. The right ventricular  size is normal. There is normal pulmonary artery  systolic pressure.  3. The mitral valve is abnormal. Mild mitral valve regurgitation.  4. The aortic valve is abnormal. Aortic valve regurgitation is not  visualized. Mild aortic valve sclerosis is present, with no evidence of  aortic valve stenosis.  5. The inferior vena cava is normal in size with greater than 50%  respiratory variability, suggesting right atrial pressure of 3 mmHg.   FINDINGS  Left Ventricle: Left ventricular ejection fraction, by estimation, is 60  to 65%. The left ventricle has normal function. The left ventricle has no  regional wall motion abnormalities. The left ventricular internal cavity  size was normal in size. There is  mild left ventricular hypertrophy. Left ventricular diastolic parameters  are consistent with Grade I diastolic dysfunction (impaired relaxation).   Right Ventricle: The right ventricular size is normal. No increase in  right ventricular wall thickness. Right ventricular systolic function is  normal. There is normal pulmonary artery systolic pressure. The tricuspid  regurgitant velocity is 2.65 m/s, and  with an assumed right atrial pressure of 3 mmHg, the estimated right  ventricular systolic pressure is 46.9 mmHg.   Left Atrium: Left atrial size was normal in size.   Right Atrium:  Right atrial size was normal in size.   Pericardium: There is no evidence of pericardial effusion.   Mitral Valve: The mitral valve is abnormal. Mild mitral annular  calcification. Mild mitral valve regurgitation.   Tricuspid Valve: The tricuspid valve is grossly normal. Tricuspid valve  regurgitation is mild.   Aortic Valve: The aortic valve is abnormal. Aortic valve regurgitation is  not visualized. Mild aortic valve sclerosis is present, with no evidence  of aortic valve stenosis.   Pulmonic Valve: The pulmonic valve was not well visualized. Pulmonic valve  regurgitation is not visualized.   Aorta: The aortic root and ascending aorta are  structurally normal, with  no evidence of dilitation.   Venous: The inferior vena cava is normal in size with greater than 50%  respiratory variability, suggesting right atrial pressure of 3 mmHg.   IAS/Shunts: The interatrial septum was not assessed.   Patient Profile     86 y.o. female with a hx of HTN, HLD, and admitted after fall, and fx Lt hip now followed for atrial fib brief episode.    Assessment & Plan    1. A fib with RVR new onset. Now bursts of SVT will increase BB  2.  + chest pain associated with rapid HR and neg troponin, no ST changes on EKG.  Echo with normal EF 60-65%, no RWMA, mild LVH, G1DD and normal LA size.  With her age most likely CAD, but unless recurrent pain would hold on ischemic eval.    cha2DS2VAsc 4, with age, HTN female -- though short episodes of PAF.  Will add BB for now to prevent more episodes with thought to BB  3. fx Lt hip with Lefthip hemiarthroplasty, anterior approach.  POD # 4 per ortho 4. HTN was on amlodipine prior to admit held for lower BP but has improved.   BB for now.        For questions or updates, please contact [place] Please consult [email or web address] for contact info under        Signed, Cecilie Kicks, NP  11/18/2019, 8:12 AM    Personally seen and examined. Agree with above.   86 year old hip fracture fall brief atrial fibrillation episode.  Pleasant, alert, regular rate and rhythm Telemetry personally reviewed shows occasional SVT versus, likely paroxysmal atrial tachycardia.  A. fib with RVR Hip fracture Hypertension SVT  -Because of short episodes of A. fib, we will avoid anticoagulation especially postoperatively in the setting.  Obviously if atrial fibrillation returns, we would need to reconsider. -Agree with increasing beta-blocker.  This may help suppress bursts of SVT.  Ready from a cardiac perspective for discharge whenever orthopedics is comfortable.  Candee Furbish, MD

## 2019-11-18 NOTE — Plan of Care (Signed)
  Problem: Education: Goal: Knowledge of General Education information will improve Description: Including pain rating scale, medication(s)/side effects and non-pharmacologic comfort measures Outcome: Not Progressing   

## 2019-11-18 NOTE — Progress Notes (Signed)
    Subjective:  Patient reports pain as mild.  Denies N/V/CP/SOB. Being treated by cardiology for afib with RVR.  Objective:   VITALS:   Vitals:   11/17/19 1814 11/17/19 2050 11/18/19 0507 11/18/19 0930  BP: (!) 137/59 (!) 170/84 139/61   Pulse: 77 92 65 (!) 108  Resp: 20 18 18    Temp: 99.1 F (37.3 C) 98.8 F (37.1 C) 98.7 F (37.1 C)   TempSrc: Oral Oral Oral   SpO2: 96% 98% 95%   Weight:      Height:        NAD ABD soft Sensation intact distally Intact pulses distally Dorsiflexion/Plantar flexion intact Incision: dressing C/D/I Compartment soft   Lab Results  Component Value Date   WBC 7.9 11/18/2019   HGB 9.0 (L) 11/18/2019   HCT 28.2 (L) 11/18/2019   MCV 91.3 11/18/2019   PLT 298 11/18/2019   BMET    Component Value Date/Time   NA 138 11/18/2019 0516   K 3.5 11/18/2019 0516   CL 107 11/18/2019 0516   CO2 21 (L) 11/18/2019 0516   GLUCOSE 91 11/18/2019 0516   BUN 12 11/18/2019 0516   CREATININE 0.42 (L) 11/18/2019 0516   CALCIUM 8.7 (L) 11/18/2019 0516   GFRNONAA >60 11/18/2019 0516   GFRAA >60 11/18/2019 0516     Assessment/Plan: 4 Days Post-Op   Principal Problem:   Closed left hip fracture, initial encounter (HCC) Active Problems:   Hypokalemia   Pre-op evaluation   Prolonged QT interval   Noninfected skin tear of lower extremity, left, initial encounter   Goals of care, counseling/discussion   DNR (do not resuscitate)   Palliative care by specialist   Postoperative anemia due to acute blood loss   Essential hypertension   Fall at home   Anemia due to vitamin B12 deficiency   Dysphagia   New onset atrial fibrillation (HCC)   WBAT with walker DVT ppx: Lovenox, SCDs, TEDS PO pain control PT/OT Dispo: d/c on apixaban when medically ready, ok for dc from ortho standpoint   Hilton Cork Lamonte Hartt 11/18/2019, 1:09 PM   Rod Can, MD [phone] [place] is now [facility]  [place] [address],  [address], [place], [place] [address] Phone: [phone] [email or web address] Facebook  [employer]

## 2019-11-18 NOTE — Progress Notes (Signed)
PROGRESS NOTE    [name] [name]  EUM:[id number] DOB: [birth year] DOA: 11/14/2019 PCP: Lajean Manes, MD    Chief Complaint  Patient presents with  . Hip Injury    Brief Narrative:  Patient 86 year old lady who was admitted with mechanical fall while picking up trash can and sustained a right hip fracture status post repair 11/14/2019.  Seen by PT who are recommending SNF placement.   Assessment & Plan:   Principal Problem:   Closed left hip fracture, initial encounter [facility]) Active Problems:   Hypokalemia   Pre-op evaluation   Prolonged QT interval   Noninfected skin tear of lower extremity, left, initial encounter   Goals of care, counseling/discussion   DNR (do not resuscitate)   Palliative care by specialist   Postoperative anemia due to acute blood loss   Essential hypertension   Fall at home   Anemia due to vitamin B12 deficiency   Dysphagia   New onset atrial fibrillation (HCC)  1 left hip fracture status post left hip hemiarthroplasty 11/14/2019 per Dr. Lyla Glassing Secondary to mechanical fall.  Patient states adequate pain control at this time.  Continue current regimen of scheduled Tylenol, Norco as needed, IV morphine as needed severe pain.  Patient seen by PT/OT who are recommending SNF placement.  Orthopedics following.  2.  New onset A. Fib/?  Paroxysmal atrial tachycardia versus SVT Patient noted to go into atrial fibrillation overnight.  Patient with no prior history of atrial fibrillation.  Cardiac enzymes obtained negative.  2D echo ordered and EF of 60 to 65%, no wall motion abnormalities, grade 1 diastolic dysfunction, mild LVH, mild MVR, left atrial size is normal..  Patient seen in consultation by cardiology and patient started on low-dose beta-blocker to prevent patient from going back into A. fib with RVR.  Patient noted to have some bursts of SVT and what seems like paroxysmal atrial tachycardia per cardiology and as such beta-blocker dose increased.  Cardiology  not recommending anticoagulation at this time given short nature of patient's A. fib and inciting event of hip fracture.  Patient currently on aspirin for DVT prophylaxis per orthopedics for hip surgery.  Follow.  3.  Hypertension Patient noted to be soft early on during the hospitalization.  Norvasc was discontinued.  Patient currently on beta-blocker secondary to A. fib/paroxysmal atrial tachycardia.  Supportive care.  Follow.   4.  QTc prolongation Repeat EKG with resolution of QTC prolongation.  5.  Hypokalemia Repleted.  Potassium at 3.5.  Potassium packet 40 mEq p.o. x1.  Magnesium at 2.  Patient with new onset A. fib and as such we will try to keep potassium close to 4.  6.  Dehydration Improved with hydration.  IVF saline locked. Follow.   7.  Reactive leukocytosis Resolved.  8.  Postop acute blood loss anemia/anemia of chronic disease/vitamin B12 deficiency. Patient with no overt bleeding.  Hemoglobin currently at 9.0.  IV fluids have been saline locked.  Anemia panel with iron of 14, TIBC of 228, ferritin of 187, folate of 8.9, vitamin B12 of 159.  Continue vitamin B12 1000 MCG subcutaneously daily x6 days, then weekly x1 month, then monthly.  We will give a dose of IV Feraheme.  Will likely need oral iron supplementation on discharge. Follow.  9.  Dysphagia Patient with complaints of difficulty swallowing feels like food may be getting stuck however patient also with significant burping.  Continue PPI.  Patient denied specialist by speech therapy.  Diet downgraded to a dysphagia 3  diet.  Barium esophagram ordered.  Follow.     DVT prophylaxis: Per orthopedics/Lovenox Code Status: DNR Family Communication: Updated patient.  No family at bedside. Disposition:   Status is: Inpatient    Dispo: The patient is from: Home              Anticipated d/c is to: SNF              Anticipated d/c date is: 2 to 3 days.              Patient noted to have gone into atrial fibrillation  the night of 11/16/2019, currently being assessed by cardiology for A. fib.  Patient with some bouts of SVT and paroxysmal atrial tachycardia.  Patient with complaints of dysphagia.  Not stable for discharge.         Consultants:   Orthopedics: Dr. Lyla Glassing 11/14/2019  Palliative care: Vinie Sill, NP [id number]/54/9826  Cardiology: Dr. Marlou Porch 11/17/2019  Procedures:   Plain films of the left shoulder 11/14/2019  Plain films of the pelvis 11/14/2019  Chest x-ray 11/14/2019  Plain films of the left hip and pelvis 11/14/2019  Plain films of the right tib-fib 11/14/2019  Plain films of the pelvis 11/14/2019  Left hip hemiarthroplasty per Dr. Lyla Glassing 11/14/2019  2D echo 11/17/2019  Antimicrobials:   None   Subjective: Sitting up in chair.  States does not feel too well today.  Complaining of difficulty swallowing feels like food may get stuck and not going down.  Denies chest pain or shortness of breath.  Does endorse some palpitations.  Per cardiology telemetry with occasional SVT versus paroxysmal atrial tachycardia.  Patient being seen by speech therapy.   Objective: Vitals:   11/17/19 1814 11/17/19 2050 11/18/19 0507 11/18/19 0930  BP: (!) 137/59 (!) 170/84 139/61   Pulse: 77 92 65 (!) 108  Resp: 20 18 18    Temp: 99.1 F (37.3 C) 98.8 F (37.1 C) 98.7 F (37.1 C)   TempSrc: Oral Oral Oral   SpO2: 96% 98% 95%   Weight:      Height:        Intake/Output Summary (Last 24 hours) at 11/18/2019 1201 Last data filed at 11/18/2019 1100 Gross per 24 hour  Intake 600 ml  Output --  Net 600 ml   Filed Weights   11/14/19 0645  Weight: 45 kg    Examination:  General exam: NAD Respiratory system: Lungs clear to auscultation bilaterally.  No wheezes, no crackles, no rhonchi.  Normal respiratory effort.  Speaking in full sentences.  Cardiovascular system: Irregularly irregular.  No murmurs rubs or gallops.  No JVD.  No lower extremity edema.  Gastrointestinal system: Abdomen is  soft, nontender, nondistended, positive bowel sounds.  No rebound.  No guarding.  Central nervous system: Alert and oriented. No focal neurological deficits. Extremities: Symmetric 5 x 5 power. Skin: No rashes, lesions or ulcers Psychiatry: Judgement and insight appear normal. Mood & affect appropriate.     Data Reviewed: I have personally reviewed following labs and imaging studies  CBC: Recent Labs  Lab 11/14/19 0655 11/15/19 0320 11/16/19 0304 11/17/19 0302 11/18/19 0516  WBC 10.7* 8.7 9.2 9.1 7.9  NEUTROABS 9.1*  --   --   --   --   HGB 13.1 9.4* 10.4* 9.7* 9.0*  HCT 40.8 30.1* 33.7* 30.5* 28.2*  MCV 91.7 93.5 94.4 92.1 91.3  PLT 272 203 226 250 415    Basic Metabolic Panel: Recent Labs  Lab 11/14/19  0940 11/15/19 0320 11/16/19 0304 11/17/19 0302 11/17/19 0819 11/18/19 0516  NA 139 135 142 137  --  138  K 3.2* 4.4 4.1 3.6  --  3.5  CL 107 104 108 108  --  107  CO2 23 24 23 22   --  21*  GLUCOSE 135* 166* 111* 115*  --  91  BUN 10 10 9 10   --  12  CREATININE 0.48 0.65 0.56 0.41*  --  0.42*  CALCIUM 8.9 8.2* 8.6* 8.2*  --  8.7*  MG 1.9  --   --   --  2.2 2.0    GFR: Estimated Creatinine Clearance: 29.9 mL/min (A) (by C-G formula based on SCr of 0.42 mg/dL (L)).  Liver Function Tests: No results for input(s): AST, ALT, ALKPHOS, BILITOT, PROT, ALBUMIN in the last 168 hours.  CBG: No results for input(s): GLUCAP in the last 168 hours.   Recent Results (from the past 240 hour(s))  SARS Coronavirus 2 by RT PCR (hospital order, performed in [facility] hospital lab) Nasopharyngeal Nasopharyngeal Swab     Status: None   Collection Time: 11/14/19  7:44 AM   Specimen: Nasopharyngeal Swab  Result Value Ref Range Status   SARS Coronavirus 2 NEGATIVE NEGATIVE Final    Comment: (NOTE) SARS-CoV-2 target nucleic acids are NOT DETECTED.  The SARS-CoV-2 RNA is generally detectable in upper and lower respiratory specimens during the acute phase of infection. The  lowest concentration of SARS-CoV-2 viral copies this assay can detect is 250 copies / mL. A negative result does not preclude SARS-CoV-2 infection and should not be used as the sole basis for treatment or other patient management decisions.  A negative result may occur with improper specimen collection / handling, submission of specimen other than nasopharyngeal swab, presence of viral mutation(s) within the areas targeted by this assay, and inadequate number of viral copies (<250 copies / mL). A negative result must be combined with clinical observations, patient history, and epidemiological information.  Fact Sheet for Patients:   StrictlyIdeas.no  Fact Sheet for Healthcare Providers: BankingDealers.co.za  This test is not yet approved or  cleared by the [place] FDA and has been authorized for detection and/or diagnosis of SARS-CoV-2 by FDA under an Emergency Use Authorization (EUA).  This EUA will remain in effect (meaning this test can be used) for the duration of the COVID-19 declaration under Section 564(b)(1) of the Act, 21 U.S.C. section 360bbb-3(b)(1), unless the authorization is terminated or revoked sooner.  Performed at [facility], [place] [address], [place], [place] [address]   Surgical pcr screen     Status: None   Collection Time: 11/14/19 10:32 AM   Specimen: Nasal Mucosa; Nasal Swab  Result Value Ref Range Status   MRSA, PCR NEGATIVE NEGATIVE Final   Staphylococcus aureus NEGATIVE NEGATIVE Final    Comment: (NOTE) The Xpert SA Assay (FDA approved for NASAL specimens in patients 72 years of age and older), is one component of a comprehensive surveillance program. It is not intended to diagnose infection nor to guide or monitor treatment. Performed at [facility], [place] [address], [place],  [address]          Radiology Studies: ECHOCARDIOGRAM  COMPLETE  Result Date: 11/17/2019    ECHOCARDIOGRAM REPORT   Patient Name:   [name] [name] Date of Exam: 11/17/2019 Medical Rec #:  [id number]    Height:       60.0 in Accession #:  [id number]   Weight:       99.2 lb Date of Birth:  [birth year]    BSA:          1.386 m Patient Age:    89 years     BP:           147/73 mmHg Patient Gender: F            HR:           89 bpm. Exam Location:  Inpatient Procedure: 2D Echo, Cardiac Doppler and Color Doppler Indications:    I48.0 Paroxysmal atrial fibrillation  History:        Patient has no prior history of Echocardiogram examinations.                 Arrythmias:RBBB; Risk Factors:Hypertension and Dyslipidemia.  Sonographer:    Jonelle Sidle Dance Referring Phys: [place]  1. Left ventricular ejection fraction, by estimation, is 60 to 65%. The left ventricle has normal function. The left ventricle has no regional wall motion abnormalities. There is mild left ventricular hypertrophy. Left ventricular diastolic parameters are consistent with Grade I diastolic dysfunction (impaired relaxation).  2. Right ventricular systolic function is normal. The right ventricular size is normal. There is normal pulmonary artery systolic pressure.  3. The mitral valve is abnormal. Mild mitral valve regurgitation.  4. The aortic valve is abnormal. Aortic valve regurgitation is not visualized. Mild aortic valve sclerosis is present, with no evidence of aortic valve stenosis.  5. The inferior vena cava is normal in size with greater than 50% respiratory variability, suggesting right atrial pressure of 3 mmHg. FINDINGS  Left Ventricle: Left ventricular ejection fraction, by estimation, is 60 to 65%. The left ventricle has normal function. The left ventricle has no regional wall motion abnormalities. The left ventricular internal cavity size was normal in size. There is  mild left ventricular hypertrophy. Left ventricular diastolic parameters are consistent with Grade I  diastolic dysfunction (impaired relaxation). Right Ventricle: The right ventricular size is normal. No increase in right ventricular wall thickness. Right ventricular systolic function is normal. There is normal pulmonary artery systolic pressure. The tricuspid regurgitant velocity is 2.65 m/s, and  with an assumed right atrial pressure of 3 mmHg, the estimated right ventricular systolic pressure is 67.3 mmHg. Left Atrium: Left atrial size was normal in size. Right Atrium: Right atrial size was normal in size. Pericardium: There is no evidence of pericardial effusion. Mitral Valve: The mitral valve is abnormal. Mild mitral annular calcification. Mild mitral valve regurgitation. Tricuspid Valve: The tricuspid valve is grossly normal. Tricuspid valve regurgitation is mild. Aortic Valve: The aortic valve is abnormal. Aortic valve regurgitation is not visualized. Mild aortic valve sclerosis is present, with no evidence of aortic valve stenosis. Pulmonic Valve: The pulmonic valve was not well visualized. Pulmonic valve regurgitation is not visualized. Aorta: The aortic root and ascending aorta are structurally normal, with no evidence of dilitation. Venous: The inferior vena cava is normal in size with greater than 50% respiratory variability, suggesting right atrial pressure of 3 mmHg. IAS/Shunts: The interatrial septum was not assessed.  LEFT VENTRICLE PLAX 2D LVIDd:         3.90 cm LVIDs:         2.60 cm LV PW:         0.90 cm LV IVS:        1.40 cm LVOT diam:     1.80 cm LV SV:  49 LV SV Index:   35 LVOT Area:     2.54 cm  RIGHT VENTRICLE          IVC RV Basal diam:  2.60 cm  IVC diam: 1.40 cm TAPSE (M-mode): 1.8 cm LEFT ATRIUM             Index       RIGHT ATRIUM          Index LA diam:        4.00 cm 2.89 cm/m  RA Area:     7.78 cm LA Vol (A2C):   49.5 ml 35.72 ml/m RA Volume:   11.70 ml 8.44 ml/m LA Vol (A4C):   59.3 ml 42.80 ml/m LA Biplane Vol: 53.9 ml 38.90 ml/m  AORTIC VALVE LVOT Vmax:   89.65  cm/s LVOT Vmean:  59.200 cm/s LVOT VTI:    0.192 m  AORTA Ao Root diam: 3.10 cm Ao Asc diam:  2.90 cm MITRAL VALVE                TRICUSPID VALVE MV Area (PHT): 3.65 cm     TR Peak grad:   28.1 mmHg MV Decel Time: 208 msec     TR Vmax:        265.00 cm/s MV E velocity: 100.00 cm/s MV A velocity: 98.10 cm/s   SHUNTS MV E/A ratio:  1.02         Systemic VTI:  0.19 m                             Systemic Diam: 1.80 cm Dorris Carnes MD Electronically signed by Dorris Carnes MD Signature Date/Time: 11/17/2019/4:44:49 PM    Final         Scheduled Meds: . cyanocobalamin  1,000 mcg Subcutaneous Daily  . docusate sodium  100 mg Oral BID  . enoxaparin (LOVENOX) injection  30 mg Subcutaneous Q24H  . metoprolol tartrate  25 mg Oral BID  . pantoprazole  40 mg Oral Q0600  . polyethylene glycol  17 g Oral Daily  . potassium chloride  40 mEq Oral Once  . potassium chloride  40 mEq Oral Once   Continuous Infusions: . methocarbamol (ROBAXIN) IV Stopped (11/14/19 1532)     LOS: 4 days    Time spent: 35 minutes    Irine Seal, MD [facility]   To contact the attending provider between 7A-7P or the covering provider during after hours 7P-7A, please log into the web site [email or web address] and access using universal  password for that web site. If you do not have the password, please call the hospital operator.  11/18/2019, 12:01 PM

## 2019-11-18 NOTE — TOC Progression Note (Signed)
Transition of Care [facility]) - Progression Note    Patient Details  Name: [name] [name] MRN: [id number] Date of Birth: 05/18/1922  Transition of Care [facility]) CM/SW Contact  Jeri Jeanbaptiste, Juliann Pulse, RN Phone Number: 11/18/2019, 11:42 AM  Clinical Narrative: Not medically stable today.[facility] rep Ashley-not able to accept over weekend-(no one to admi)t,can accept Monday.Will need new covid.      Expected Discharge Plan: [place] Barriers to Discharge: Continued Medical Work up  Expected Discharge Plan and Services Expected Discharge Plan: [place]   Discharge Planning Services: CM Consult Post Acute Care Choice: [place] Living arrangements for the past 2 months: Single Family Home                 DME Arranged: N/A DME Agency: NA       HH Arranged: NA HH Agency: NA         Social Determinants of Health (SDOH) Interventions    Readmission Risk Interventions No flowsheet data found.

## 2019-11-19 LAB — BASIC METABOLIC PANEL
Anion gap: 13 (ref 5–15)
BUN: 11 mg/dL (ref 8–23)
CO2: 22 mmol/L (ref 22–32)
Calcium: 9.3 mg/dL (ref 8.9–10.3)
Chloride: 105 mmol/L (ref 98–111)
Creatinine, Ser: 0.51 mg/dL (ref 0.44–1.00)
GFR calc Af Amer: >60 mL/min (ref >60)
GFR calc non Af Amer: >60 mL/min (ref >60)
Glucose, Bld: 92 mg/dL (ref 70–99)
Potassium: 4.4 mmol/L (ref 3.5–5.1)
Sodium: 140 mmol/L (ref 135–145)

## 2019-11-19 LAB — CBC
HCT: 33.1 % — ABNORMAL LOW (ref 36.0–46.0)
Hemoglobin: 10.5 g/dL — ABNORMAL LOW (ref 12.0–15.0)
MCH: 29.4 pg (ref 26.0–34.0)
MCHC: 31.7 g/dL (ref 30.0–36.0)
MCV: 92.7 fL (ref 80.0–100.0)
Platelets: 373 10*3/uL (ref 150–400)
RBC: 3.57 MIL/uL — ABNORMAL LOW (ref 3.87–5.11)
RDW: 13 % (ref 11.5–15.5)
WBC: 9 10*3/uL (ref 4.0–10.5)
nRBC: 0 % (ref 0.0–0.2)

## 2019-11-19 MED ORDER — MELATONIN 3 MG PO TABS
3.0000 mg | ORAL_TABLET | Freq: Every evening | ORAL | Status: DC | PRN
  Administered 2019-11-19: 3 mg via ORAL
  Filled 2019-11-19: qty 1

## 2019-11-19 MED ORDER — HYDROXYZINE HCL 10 MG PO TABS
10.0000 mg | ORAL_TABLET | Freq: Three times a day (TID) | ORAL | Status: DC | PRN
  Administered 2019-11-19 – 2019-11-22 (×4): 10 mg via ORAL
  Filled 2019-11-19 (×5): qty 1

## 2019-11-19 NOTE — Progress Notes (Signed)
PROGRESS NOTE    [name] [name]  JGG:[id number] DOB: 04/30/1922 DOA: 11/14/2019 PCP: Lajean Manes, MD    Chief Complaint  Patient presents with  . Hip Injury    Brief Narrative:  Patient 86 year old lady who was admitted with mechanical fall while picking up trash can and sustained a right hip fracture status post repair 11/14/2019.  Seen by PT who are recommending SNF placement.   Assessment & Plan:   Principal Problem:   Closed left hip fracture, initial encounter [facility]) Active Problems:   Hypokalemia   Pre-op evaluation   Prolonged QT interval   Noninfected skin tear of lower extremity, left, initial encounter   Goals of care, counseling/discussion   DNR (do not resuscitate)   Palliative care by specialist   Postoperative anemia due to acute blood loss   Essential hypertension   Fall at home   Anemia due to vitamin B12 deficiency   Dysphagia   New onset atrial fibrillation (HCC)  1 left hip fracture status post left hip hemiarthroplasty 11/14/2019 per Dr. Lyla Glassing Secondary to mechanical fall.  Patient states adequate pain control at this time.  Continue current regimen of scheduled Tylenol, Norco as needed, IV morphine as needed severe pain.  Patient seen by PT/OT who are recommending SNF placement.  Orthopedics recommending Eliquis on discharge for DVT prophylaxis.  Orthopedics following.  2.  New onset A. Fib/?  Paroxysmal atrial tachycardia versus SVT Patient noted to go into atrial fibrillation overnight.  Patient with no prior history of atrial fibrillation.  Cardiac enzymes obtained negative.  2D echo ordered and EF of 60 to 65%, no wall motion abnormalities, grade 1 diastolic dysfunction, mild LVH, mild MVR, left atrial size is normal..  Patient seen in consultation by cardiology and patient started on low-dose beta-blocker to prevent patient from going back into A. fib with RVR.  Patient noted to have some bursts of SVT and what seems like paroxysmal atrial tachycardia  per cardiology and as such beta-blocker dose increased.  Cardiology not recommending anticoagulation at this time given short nature of patient's A. fib and inciting event of hip fracture.  Patient currently on aspirin for DVT prophylaxis per orthopedics for hip surgery.  Orthopedics recommending Eliquis on discharge for DVT prophylaxis.  Follow.  3.  Hypertension Patient noted to be soft early on during the hospitalization.  Norvasc discontinued.  Patient started on beta-blocker secondary to A. fib/paroxysmal atrial tachycardia.  Supportive care.  Follow.   4.  QTc prolongation Repeat EKG with resolution of QTC prolongation.  5.  Hypokalemia Repleted.  Potassium at 4.4.  Potassium packet 40 mEq p.o. x1.  Magnesium at 2.  Patient with new onset A. fib versus paroxysmal atrial tachycardia, and as such we will try to keep potassium close to 4.  6.  Dehydration Improved with hydration.  IV fluids saline lock.    7.  Reactive leukocytosis Resolved.  8.  Postop acute blood loss anemia/anemia of chronic disease/vitamin B12 deficiency. Patient with no overt bleeding.  Hemoglobin currently at 10.5.  IV fluids have been saline locked.  Anemia panel with iron of 14, TIBC of 228, ferritin of 187, folate of 8.9, vitamin B12 of 159.  Continue vitamin B12 1000 MCG subcutaneously daily x 5 days, then weekly x1 month, then monthly.  Status post IV Feraheme.  Will likely need oral iron supplementation on discharge. Follow.  9.  Dysphagia Patient with complaints of difficulty swallowing feels like food may be getting stuck however patient also with  significant burping.  Improving clinically.  Patient underwent a barium esophagram that showed an extremely tortuous distal esophagus presumably accounting for slow passage of food in the esophagus, no high-grade obstruction, moderate hiatal hernia, findings similar but mildly progressed from esophagram in 2016.  Diet modifications made per speech therapy.  Outpatient  follow-up.     DVT prophylaxis: Per orthopedics/Lovenox Code Status: DNR Family Communication: Updated patient.  No family at bedside. Disposition:   Status is: Inpatient    Dispo: The patient is from: Home              Anticipated d/c is to: SNF              Anticipated d/c date is: Monday, 11/21/2019              Patient noted to have gone into atrial fibrillation the night of 11/16/2019, has been assessed by cardiology for A. fib.  Some bouts of SVT and paroxysmal atrial tachycardia.  Dysphagia improving.  Not stable for discharge.        Consultants:   Orthopedics: Dr. Lyla Glassing 11/14/2019  Palliative care: Vinie Sill, NP [id number]/5277  Cardiology: Dr. Marlou Porch 11/17/2019  Procedures:   Plain films of the left shoulder 11/14/2019  Plain films of the pelvis 11/14/2019  Chest x-ray 11/14/2019  Plain films of the left hip and pelvis 11/14/2019  Plain films of the right tib-fib 11/14/2019  Plain films of the pelvis 11/14/2019  Left hip hemiarthroplasty per Dr. Lyla Glassing 11/14/2019  2D echo 11/17/2019  Barium esophagram 11/18/2019  Antimicrobials:   None   Subjective: Patient laying in bed.  Denies any chest pain or shortness of breath.  Feels swallowing has improved today.  Feeling better.  Appreciative of the care she is receiving.  Objective: Vitals:   11/18/19 0930 11/18/19 1524 11/18/19 2123 11/19/19 0504  BP:  (!) 124/45 (!) 113/51 (!) 167/76  Pulse: (!) 108 68 78 81  Resp:  14 16 19   Temp:  98.1 F (36.7 C) 99.6 F (37.6 C) 98.6 F (37 C)  TempSrc:  Oral Oral Oral  SpO2:  97% 96% 97%  Weight:      Height:        Intake/Output Summary (Last 24 hours) at 11/19/2019 1222 Last data filed at 11/19/2019 0654 Gross per 24 hour  Intake 240 ml  Output 500 ml  Net -260 ml   Filed Weights   11/14/19 0645  Weight: 45 kg    Examination:  General exam: NAD Respiratory system: CTA B.  No wheezes, no crackles, no rhonchi.  Normal respiratory effort.   Speaking in full sentences.   Cardiovascular system: Irregularly irregular.  No murmurs rubs or gallops.  No JVD.  No lower extremity edema.   Gastrointestinal system: Abdomen is soft, nontender, nondistended, positive bowel sounds.  No rebound.  No guarding.  Central nervous system: Alert and oriented. No focal neurological deficits. Extremities: Symmetric 5 x 5 power. Skin: No rashes, lesions or ulcers Psychiatry: Judgement and insight appear normal. Mood & affect appropriate.     Data Reviewed: I have personally reviewed following labs and imaging studies  CBC: Recent Labs  Lab 11/14/19 0655 11/14/19 0655 11/15/19 0320 11/16/19 0304 11/17/19 0302 11/18/19 0516 11/19/19 0602  WBC 10.7*   < > 8.7 9.2 9.1 7.9 9.0  NEUTROABS 9.1*  --   --   --   --   --   --   HGB 13.1   < > 9.4* 10.4* 9.7*  9.0* 10.5*  HCT 40.8   < > 30.1* 33.7* 30.5* 28.2* 33.1*  MCV 91.7   < > 93.5 94.4 92.1 91.3 92.7  PLT 272   < > 203 226 250 298 373   < > = values in this interval not displayed.    Basic Metabolic Panel: Recent Labs  Lab 11/14/19 0655 11/14/19 0655 11/15/19 0320 11/16/19 0304 11/17/19 0302 11/17/19 0819 11/18/19 0516 11/19/19 0602  NA 139   < > 135 142 137  --  138 140  K 3.2*   < > 4.4 4.1 3.6  --  3.5 4.4  CL 107   < > 104 108 108  --  107 105  CO2 23   < > 24 23 22   --  21* 22  GLUCOSE 135*   < > 166* 111* 115*  --  91 92  BUN 10   < > 10 9 10   --  12 11  CREATININE 0.48   < > 0.65 0.56 0.41*  --  0.42* 0.51  CALCIUM 8.9   < > 8.2* 8.6* 8.2*  --  8.7* 9.3  MG 1.9  --   --   --   --  2.2 2.0  --    < > = values in this interval not displayed.    GFR: Estimated Creatinine Clearance: 29.9 mL/min (by C-G formula based on SCr of 0.51 mg/dL).  Liver Function Tests: No results for input(s): AST, ALT, ALKPHOS, BILITOT, PROT, ALBUMIN in the last 168 hours.  CBG: No results for input(s): GLUCAP in the last 168 hours.   Recent Results (from the past 240 hour(s))  SARS  Coronavirus 2 by RT PCR (hospital order, performed in [facility] hospital lab) Nasopharyngeal Nasopharyngeal Swab     Status: None   Collection Time: 11/14/19  7:44 AM   Specimen: Nasopharyngeal Swab  Result Value Ref Range Status   SARS Coronavirus 2 NEGATIVE NEGATIVE Final    Comment: (NOTE) SARS-CoV-2 target nucleic acids are NOT DETECTED.  The SARS-CoV-2 RNA is generally detectable in upper and lower respiratory specimens during the acute phase of infection. The lowest concentration of SARS-CoV-2 viral copies this assay can detect is 250 copies / mL. A negative result does not preclude SARS-CoV-2 infection and should not be used as the sole basis for treatment or other patient management decisions.  A negative result may occur with improper specimen collection / handling, submission of specimen other than nasopharyngeal swab, presence of viral mutation(s) within the areas targeted by this assay, and inadequate number of viral copies (<250 copies / mL). A negative result must be combined with clinical observations, patient history, and epidemiological information.  Fact Sheet for Patients:   StrictlyIdeas.no  Fact Sheet for Healthcare Providers: BankingDealers.co.za  This test is not yet approved or  cleared by the [place] FDA and has been authorized for detection and/or diagnosis of SARS-CoV-2 by FDA under an Emergency Use Authorization (EUA).  This EUA will remain in effect (meaning this test can be used) for the duration of the COVID-19 declaration under Section 564(b)(1) of the Act, 21 U.S.C. section 360bbb-3(b)(1), unless the authorization is terminated or revoked sooner.  Performed at [facility], [place] [address] [place], [place] [address]   Surgical pcr screen     Status: None   Collection Time: 11/14/19 10:32 AM   Specimen: Nasal Mucosa; Nasal Swab  Result Value Ref Range Status   MRSA, PCR  NEGATIVE NEGATIVE Final  Staphylococcus aureus NEGATIVE NEGATIVE Final    Comment: (NOTE) The Xpert SA Assay (FDA approved for NASAL specimens in patients 19 years of age and older), is one component of a comprehensive surveillance program. It is not intended to diagnose infection nor to guide or monitor treatment. Performed at [facility], [place] [address] [place], [place] [address]          Radiology Studies: DG ESOPHAGUS W SINGLE CM (SOL OR THIN BA)  Result Date: 11/18/2019 CLINICAL DATA:  Feeling of food sticking in esophagus. EXAM: ESOPHOGRAM/BARIUM SWALLOW TECHNIQUE: Single contrast examination was performed using  thin barium. FLUOROSCOPY TIME:  Fluoroscopy Time:  1 minutes 12 seconds Radiation Exposure Index (if provided by the fluoroscopic device): 12 mGy Number of Acquired Spot Images: 7 COMPARISON:  Esophagram 07/10/2014 FINDINGS: Exam is limited due to patient's recent open reduction internal fixation a hip fracture. Oral contrast flowed readily through the esophagus into the stomach. The distal esophagus is extremely tortuous. This finding is similar to but slightly progressed from comparison exam 2016. There is a moderate size hiatal hernia. No high-grade obstruction.  No reflux demonstrated. IMPRESSION: 1. Extremely tortuous distal esophagus presumably accounts for feeling of slow passage of food in the esophagus. Recommend small portion instructions. 2. No high-grade obstruction. 3. Moderate hiatal hernia. 4. Findings similar to but mildly progressed from esophagram of 2016. Electronically Signed   By: Suzy Bouchard M.D.   On: 11/18/2019 14:50        Scheduled Meds: . cyanocobalamin  1,000 mcg Subcutaneous Daily  . docusate sodium  100 mg Oral BID  . enoxaparin (LOVENOX) injection  30 mg Subcutaneous Q24H  . metoprolol tartrate  25 mg Oral BID  . pantoprazole  40 mg Oral Q0600  . polyethylene glycol  17 g Oral Daily  . potassium chloride  40 mEq  Oral Once   Continuous Infusions: . methocarbamol (ROBAXIN) IV Stopped (11/14/19 1532)     LOS: 5 days    Time spent: 35 minutes    Irine Seal, MD [facility]   To contact the attending provider between 7A-7P or the covering provider during after hours 7P-7A, please log into the web site [email or web address] and access using universal [place] password for that web site. If you do not have the password, please call the hospital operator.  11/19/2019, 12:22 PM

## 2019-11-19 NOTE — Progress Notes (Signed)
  Speech Language Pathology Treatment: Dysphagia  Patient Details Name: [name] [name] MRN: [id number] DOB: [birth year] Today's Date: 11/19/2019 Time: 7255-0016 SLP Time Calculation (min) (ACUTE ONLY): 8 min  Assessment / Plan / Recommendation Clinical Impression  Patient seen for f/u education after barium swallow completed previous date. Patient able to recall results of barium swallow which indicated a tortuous esophagus and hiatal hernia likely contributing to complaints of globus with po intake. Patient able to verbalize compensatory strategies taught during evaluation yesterday with minimal cueing. Additional education provided regarding general esophageal precautions, handouts provided. No further SLP needs indicated as patient without overt indication of an oropharyngeal dysphagia or aspiration on exam and can carry out precautions independently.    HPI HPI: 86yo female admitted 11/14/19 with fall at left hip pain. PMH: Right hip fx, HTN, HLD, depression, gall stones, IBS, vertigo, RBBB, HOH. Lungs CTA, CXR negative. Pt reports globus sensation, and exhibits frequent belching during meal      SLP Plan  All goals met       Recommendations  Diet recommendations: Dysphagia 3 (mechanical soft);Thin liquid Liquids provided via: Cup;Straw Medication Administration: Whole meds with liquid Supervision: Patient able to self feed Compensations: Slow rate;Small sips/bites Postural Changes and/or Swallow Maneuvers: Seated upright 90 degrees;Upright 30-60 min after meal                Oral Care Recommendations: Oral care BID Follow up Recommendations: None SLP Visit Diagnosis: Dysphagia, unspecified (R13.10) Plan: All goals met       GO              [name] Rainwater MA, CCC-SLP    [name] [name] 11/19/2019, 3:49 PM

## 2019-11-19 NOTE — Progress Notes (Signed)
CCMD reported 20 beat run of SVT. Patient asleep.   MD made aware.  Evening dose of lopressor given early. Will continue to monitor closely.

## 2019-11-19 NOTE — Progress Notes (Addendum)
Physical Therapy Treatment Patient Details Name: [name] [name] MRN: [id number] DOB: 1922/05/19 Today's Date: 11/19/2019    History of Present Illness 86 year old female admitted after fall resulting in L femoral neck fracture. Patient s/p L hip hemi direct anterior approach 11/14/19. PMH compression fx 07/2019, OA, vertigo, RBBB    PT Comments    Pt tolerates therapeutic exercise this session with cues for form, LLE AROM ~75% due to pain. Pt motivated and encouraged to get out of bed and attempt ambulation. Upon rising, pt c/o LLE pain, "sore" R shoulder and "bum" R leg, but able to maintain static standing for 90 sec before requesting to return to sitting. Pt tolerates slow. Shuffling steps at bedside over to chair, cues for sequencing and hand placement, and min A to steady and assist throughout steps. Pt tolerates remaining up in chair with BLE elevated at EOS. HR max noted to be 113 bpm with mobility without complaints. Patient will benefit from continued physical therapy in hospital and recommendations below to increase strength, balance, endurance for safe ADLs and gait.   Follow Up Recommendations  SNF     Equipment Recommendations  None recommended by PT    Recommendations for Other Services       Precautions / Restrictions Precautions Precautions: Fall Restrictions Weight Bearing Restrictions: No    Mobility  Bed Mobility Overal bed mobility: Needs Assistance Bed Mobility: Supine to Sit  Supine to sit: Min assist  General bed mobility comments: min A to bring LLE over to EOB and fully upright trunk  Transfers Overall transfer level: Needs assistance Equipment used: Rolling walker (2 wheeled) Transfers: Sit to/from [facility] Sit to Stand: Mod assist Stand pivot transfers: Mod assist  General transfer comment: mod A to power up and steady, cues for hand placement, mod A to steady with pivoting over to chair and cues for  sequencing  Ambulation/Gait Ambulation/Gait assistance: Min assist   Assistive device: Rolling walker (2 wheeled) Gait Pattern/deviations: Trunk flexed;Antalgic;Shuffle Gait velocity: decreased   General Gait Details: limited to shuffling, painful sidesteps at bedside, heavy dependence on RW for steadying and decreasing LLE WB, min A to steady with transfer   Stairs             Wheelchair Mobility    Modified Rankin (Stroke Patients Only)       Balance Overall balance assessment: Needs assistance;History of Falls Sitting-balance support: Feet supported Sitting balance-Leahy Scale: Fair Sitting balance - Comments: seated EOB   Standing balance support: Bilateral upper extremity supported;During functional activity Standing balance-Leahy Scale: Poor Standing balance comment: reliant on RW and therapist assist     Cognition Arousal/Alertness: Awake/alert Behavior During Therapy: WFL for tasks assessed/performed Overall Cognitive Status: Within Functional Limits for tasks assessed       Exercises General Exercises - Lower Extremity Ankle Circles/Pumps: AROM;Both;20 reps;Supine Quad Sets: AROM;Left;10 reps;Supine Long Arc Quad: AROM;Both;10 reps;Seated Heel Slides: AROM;Left;10 reps;Supine Hip ABduction/ADduction: AROM;Left;5 reps;Supine    General Comments        Pertinent Vitals/Pain Pain Assessment: Faces Faces Pain Scale: Hurts a little bit Pain Location: L hip with movement Pain Descriptors / Indicators: Aching;Grimacing Pain Intervention(s): Limited activity within patient's tolerance;Monitored during session;Repositioned    Home Living                      Prior Function            PT Goals (current goals can now be found in the  care plan section) Acute Rehab PT Goals Patient Stated Goal: Less pain. Regain independence PT Goal Formulation: With patient/family Time For Goal Achievement: 11/29/19 Potential to Achieve Goals:  Fair Progress towards PT goals: Progressing toward goals    Frequency    Min 3X/week      PT Plan Current plan remains appropriate    Co-evaluation              AM-PAC PT "6 Clicks" Mobility   Outcome Measure  Help needed turning from your back to your side while in a flat bed without using bedrails?: A Little Help needed moving from lying on your back to sitting on the side of a flat bed without using bedrails?: A Little Help needed moving to and from a bed to a chair (including a wheelchair)?: A Lot Help needed standing up from a chair using your arms (e.g., wheelchair or bedside chair)?: A Lot Help needed to walk in hospital room?: A Lot Help needed climbing 3-5 steps with a railing? : Total 6 Click Score: 13    End of Session Equipment Utilized During Treatment: Gait belt Activity Tolerance: Patient limited by fatigue;Patient limited by pain Patient left: in chair;with call bell/phone within reach;with chair alarm set Nurse Communication: Mobility status PT Visit Diagnosis: Muscle weakness (generalized) (M62.81);History of falling (Z91.81);Other abnormalities of gait and mobility (R26.89);Pain Pain - Right/Left: Left Pain - part of body: Hip     Time: 9767-3419 PT Time Calculation (min) (ACUTE ONLY): 25 min  Charges:  $Therapeutic Exercise: 8-22 mins $Therapeutic Activity: 8-22 mins                      Tori Azlan Hanway PT, DPT 11/19/19, 10:19 AM

## 2019-11-20 LAB — HEMOGLOBIN AND HEMATOCRIT, BLOOD
HCT: 27.9 % — ABNORMAL LOW (ref 36.0–46.0)
Hemoglobin: 9 g/dL — ABNORMAL LOW (ref 12.0–15.0)

## 2019-11-20 LAB — BASIC METABOLIC PANEL
Anion gap: 9 (ref 5–15)
BUN: 11 mg/dL (ref 8–23)
CO2: 22 mmol/L (ref 22–32)
Calcium: 8.8 mg/dL — ABNORMAL LOW (ref 8.9–10.3)
Chloride: 106 mmol/L (ref 98–111)
Creatinine, Ser: 0.52 mg/dL (ref 0.44–1.00)
GFR calc Af Amer: >60 mL/min (ref >60)
GFR calc non Af Amer: >60 mL/min (ref >60)
Glucose, Bld: 98 mg/dL (ref 70–99)
Potassium: 3.5 mmol/L (ref 3.5–5.1)
Sodium: 137 mmol/L (ref 135–145)

## 2019-11-20 MED ORDER — POTASSIUM CHLORIDE 20 MEQ PO PACK
40.0000 meq | PACK | Freq: Once | ORAL | Status: AC
  Administered 2019-11-20: 40 meq via ORAL
  Filled 2019-11-20: qty 2

## 2019-11-20 MED ORDER — LIP MEDEX EX OINT
TOPICAL_OINTMENT | CUTANEOUS | Status: AC
  Administered 2019-11-20: 1
  Filled 2019-11-20: qty 7

## 2019-11-20 NOTE — Plan of Care (Signed)

## 2019-11-20 NOTE — Progress Notes (Signed)
PROGRESS NOTE    [name] [name]  XTG:[id number] DOB: [birth year] DOA: 11/14/2019 PCP: Lajean Manes, MD    Chief Complaint  Patient presents with  . Hip Injury    Brief Narrative:  Patient 86 year old lady who was admitted with mechanical fall while picking up trash can and sustained a right hip fracture status post repair 11/14/2019.  Seen by PT who are recommending SNF placement.   Assessment & Plan:   Principal Problem:   Closed left hip fracture, initial encounter [facility]) Active Problems:   Hypokalemia   Pre-op evaluation   Prolonged QT interval   Noninfected skin tear of lower extremity, left, initial encounter   Goals of care, counseling/discussion   DNR (do not resuscitate)   Palliative care by specialist   Postoperative anemia due to acute blood loss   Essential hypertension   Fall at home   Anemia due to vitamin B12 deficiency   Dysphagia   New onset atrial fibrillation (HCC)  1 left hip fracture status post left hip hemiarthroplasty 11/14/2019 per Dr. Lyla Glassing Secondary to mechanical fall.  Patient states adequate pain control at this time.  Continue current regimen of scheduled Tylenol, Norco as needed, IV morphine as needed severe pain.  Patient seen by PT/OT who are recommending SNF placement.  Orthopedics recommending Eliquis on discharge for DVT prophylaxis.  Orthopedics following.  2.  New onset A. Fib/?  Paroxysmal atrial tachycardia versus SVT Patient noted to go into atrial fibrillation overnight.  Patient with no prior history of atrial fibrillation.  Cardiac enzymes obtained negative.  2D echo ordered and EF of 60 to 65%, no wall motion abnormalities, grade 1 diastolic dysfunction, mild LVH, mild MVR, left atrial size is normal..  Patient seen in consultation by cardiology and patient started on low-dose beta-blocker to prevent patient from going back into A. fib with RVR.  Patient noted to have some bursts of SVT and what seems like paroxysmal atrial tachycardia  per cardiology and as such beta-blocker dose increased.  Patient noted to have a 20 beat run of SVT the evening of 11/19/2018.  Lopressor dose given early.  Cardiology not recommending anticoagulation at this time given short nature of patient's A. fib and inciting event of hip fracture.  Patient currently on aspirin for DVT prophylaxis per orthopedics for hip surgery.  Orthopedics recommending Eliquis on discharge for DVT prophylaxis.  Follow.  3.  Hypertension Patient noted to be soft early on during the hospitalization.  Norvasc discontinued.  Patient started on beta-blocker secondary to A. fib/paroxysmal atrial tachycardia.  Supportive care.  Follow.   4.  QTc prolongation Repeat EKG with resolution of QTC prolongation.  5.  Hypokalemia Repleted.  Potassium at 3.5.  Potassium packet 40 mEq p.o. x1 as patient noted to have some runs of SVT yesterday.  Magnesium at 2.  Patient with new onset A. fib versus paroxysmal atrial tachycardia, and as such we will try to keep potassium close to 4.  6.  Dehydration Improved with hydration.  IV fluids saline lock.    7.  Reactive leukocytosis Resolved.  8.  Postop acute blood loss anemia/anemia of chronic disease/vitamin B12 deficiency. Patient with no overt bleeding.  Hemoglobin currently at 9.  IV fluids have been saline locked.  Anemia panel with iron of 14, TIBC of 228, ferritin of 187, folate of 8.9, vitamin B12 of 159.  Continue vitamin B12 1000 MCG subcutaneously daily x 4 days, then weekly x1 month, then monthly.  Status post IV Feraheme.  Will likely need oral iron supplementation on discharge. Follow.  9.  Dysphagia secondary to tortuous esophagus Patient with complaints of difficulty swallowing feels like food may be getting stuck however patient also with significant burping.  Improving clinically.  Patient underwent a barium esophagram that showed an extremely tortuous distal esophagus presumably accounting for slow passage of food in the  esophagus, no high-grade obstruction, moderate hiatal hernia, findings similar but mildly progressed from esophagram in 2016.  Diet modifications made per speech therapy.  Outpatient follow-up.     DVT prophylaxis: Per orthopedics/Lovenox Code Status: DNR Family Communication: Updated patient.  No family at bedside. Disposition:   Status is: Inpatient    Dispo: The patient is from: Home              Anticipated d/c is to: SNF              Anticipated d/c date is: Monday, 11/21/2019              Patient noted to have gone into atrial fibrillation the night of 11/16/2019, has been assessed by cardiology for A. fib.  Some bouts of SVT and paroxysmal atrial tachycardia.  Dysphagia improving.  Not stable for discharge.        Consultants:   Orthopedics: Dr. Lyla Glassing 11/14/2019  Palliative care: Vinie Sill, NP [id number]  Cardiology: Dr. Marlou Porch 11/17/2019  Procedures:   Plain films of the left shoulder 11/14/2019  Plain films of the pelvis 11/14/2019  Chest x-ray 11/14/2019  Plain films of the left hip and pelvis 11/14/2019  Plain films of the right tib-fib 11/14/2019  Plain films of the pelvis 11/14/2019  Left hip hemiarthroplasty per Dr. Lyla Glassing 11/14/2019  2D echo 11/17/2019  Barium esophagram 11/18/2019  Antimicrobials:   None   Subjective: Patient in bed.  States does not feel too well today.  Denies chest pain or shortness of breath.  Complains of back pain.  Complaining of some nausea.   Patient noted yesterday evening to have a twenty beat run of SVT however remained asymptomatic and was sleeping.  Lopressor dose given early.  Patient denies any significant difficulty swallowing today and states has some intermittent episodes of difficulty swallowing that has been ongoing.  Tolerating diet today.  Objective: Vitals:   11/19/19 2014 11/20/19 0529 11/20/19 1013 11/20/19 1014  BP: (!) 177/72 140/82 131/77   Pulse: 96 78 98 92  Resp: 18 12    Temp: 99.6 F (37.6  C) 98.2 F (36.8 C)    TempSrc: Oral Oral    SpO2: 94% 97% (!) 83% 90%  Weight:      Height:        Intake/Output Summary (Last 24 hours) at 11/20/2019 1043 Last data filed at 11/19/2019 1800 Gross per 24 hour  Intake 240 ml  Output --  Net 240 ml   Filed Weights   11/14/19 0645  Weight: 45 kg    Examination:  General exam: No acute distress. Respiratory system: Lungs clear to quotation bilaterally.  No wheezes, no crackles, no rhonchi.  Normal respiratory effort.  Speaking in full sentences.     Cardiovascular system: Irregularly irregular.  No murmurs rubs or gallops.  No JVD.  No lower extremity edema.   Gastrointestinal system: Abdomen is nontender, nondistended, soft, positive bowel sounds.  No rebound.  No guarding.  Central nervous system: Alert and oriented. No focal neurological deficits. Extremities: Symmetric 5 x 5 power. Skin: No rashes, lesions or ulcers Psychiatry: Judgement and insight  appear normal. Mood & affect appropriate.     Data Reviewed: I have personally reviewed following labs and imaging studies  CBC: Recent Labs  Lab 11/14/19 0655 11/14/19 0655 11/15/19 0320 11/15/19 0320 11/16/19 0304 11/17/19 0302 11/18/19 0516 11/19/19 0602 11/20/19 0518  WBC 10.7*   < > 8.7  --  9.2 9.1 7.9 9.0  --   NEUTROABS 9.1*  --   --   --   --   --   --   --   --   HGB 13.1   < > 9.4*   < > 10.4* 9.7* 9.0* 10.5* 9.0*  HCT 40.8   < > 30.1*   < > 33.7* 30.5* 28.2* 33.1* 27.9*  MCV 91.7   < > 93.5  --  94.4 92.1 91.3 92.7  --   PLT 272   < > 203  --  226 250 298 373  --    < > = values in this interval not displayed.    Basic Metabolic Panel: Recent Labs  Lab 11/14/19 0655 11/15/19 0320 11/16/19 0304 11/17/19 0302 11/17/19 6160 11/18/19 0516 11/19/19 0602 11/20/19 0518  NA 139   < > 142 137  --  138 140 137  K 3.2*   < > 4.1 3.6  --  3.5 4.4 3.5  CL 107   < > 108 108  --  107 105 106  CO2 23   < > 23 22  --  21* 22 22  GLUCOSE 135*   < > 111*  115*  --  91 92 98  BUN 10   < > 9 10  --  12 11 11   CREATININE 0.48   < > 0.56 0.41*  --  0.42* 0.51 0.52  CALCIUM 8.9   < > 8.6* 8.2*  --  8.7* 9.3 8.8*  MG 1.9  --   --   --  2.2 2.0  --   --    < > = values in this interval not displayed.    GFR: Estimated Creatinine Clearance: 29.9 mL/min (by C-G formula based on SCr of 0.52 mg/dL).  Liver Function Tests: No results for input(s): AST, ALT, ALKPHOS, BILITOT, PROT, ALBUMIN in the last 168 hours.  CBG: No results for input(s): GLUCAP in the last 168 hours.   Recent Results (from the past 240 hour(s))  SARS Coronavirus 2 by RT PCR (hospital order, performed in [facility] hospital lab) Nasopharyngeal Nasopharyngeal Swab     Status: None   Collection Time: 11/14/19  7:44 AM   Specimen: Nasopharyngeal Swab  Result Value Ref Range Status   SARS Coronavirus 2 NEGATIVE NEGATIVE Final    Comment: (NOTE) SARS-CoV-2 target nucleic acids are NOT DETECTED.  The SARS-CoV-2 RNA is generally detectable in upper and lower respiratory specimens during the acute phase of infection. The lowest concentration of SARS-CoV-2 viral copies this assay can detect is 250 copies / mL. A negative result does not preclude SARS-CoV-2 infection and should not be used as the sole basis for treatment or other patient management decisions.  A negative result may occur with improper specimen collection / handling, submission of specimen other than nasopharyngeal swab, presence of viral mutation(s) within the areas targeted by this assay, and inadequate number of viral copies (<250 copies / mL). A negative result must be combined with clinical observations, patient history, and epidemiological information.  Fact Sheet for Patients:   StrictlyIdeas.no  Fact Sheet for Healthcare Providers: BankingDealers.co.za  This  test is not yet approved or  cleared by the [place] and has been authorized for  detection and/or diagnosis of SARS-CoV-2 by FDA under an Emergency Use Authorization (EUA).  This EUA will remain in effect (meaning this test can be used) for the duration of the COVID-19 declaration under Section 564(b)(1) of the Act, 21 U.S.C. section 360bbb-3(b)(1), unless the authorization is terminated or revoked sooner.  Performed at [facility], [place] [address], [place], [place] [address]   Surgical pcr screen     Status: None   Collection Time: 11/14/19 10:32 AM   Specimen: Nasal Mucosa; Nasal Swab  Result Value Ref Range Status   MRSA, PCR NEGATIVE NEGATIVE Final   Staphylococcus aureus NEGATIVE NEGATIVE Final    Comment: (NOTE) The Xpert SA Assay (FDA approved for NASAL specimens in patients 8 years of age and older), is one component of a comprehensive surveillance program. It is not intended to diagnose infection nor to guide or monitor treatment. Performed at [facility], [place] [address], [place], [place] [address]          Radiology Studies: DG ESOPHAGUS W SINGLE CM (SOL OR THIN BA)  Result Date: 11/18/2019 CLINICAL DATA:  Feeling of food sticking in esophagus. EXAM: ESOPHOGRAM/BARIUM SWALLOW TECHNIQUE: Single contrast examination was performed using  thin barium. FLUOROSCOPY TIME:  Fluoroscopy Time:  1 minutes 12 seconds Radiation Exposure Index (if provided by the fluoroscopic device): 12 mGy Number of Acquired Spot Images: 7 COMPARISON:  Esophagram 07/10/2014 FINDINGS: Exam is limited due to patient's recent open reduction internal fixation a hip fracture. Oral contrast flowed readily through the esophagus into the stomach. The distal esophagus is extremely tortuous. This finding is similar to but slightly progressed from comparison exam 2016. There is a moderate size hiatal hernia. No high-grade obstruction.  No reflux demonstrated. IMPRESSION: 1. Extremely tortuous distal esophagus presumably accounts for feeling of slow  passage of food in the esophagus. Recommend small portion instructions. 2. No high-grade obstruction. 3. Moderate hiatal hernia. 4. Findings similar to but mildly progressed from esophagram of 2016. Electronically Signed   By: Suzy Bouchard M.D.   On: 11/18/2019 14:50        Scheduled Meds: . cyanocobalamin  1,000 mcg Subcutaneous Daily  . docusate sodium  100 mg Oral BID  . enoxaparin (LOVENOX) injection  30 mg Subcutaneous Q24H  . metoprolol tartrate  25 mg Oral BID  . pantoprazole  40 mg Oral Q0600  . polyethylene glycol  17 g Oral Daily   Continuous Infusions: . methocarbamol (ROBAXIN) IV Stopped (11/14/19 1532)     LOS: 6 days    Time spent: 35 minutes    Irine Seal, MD [facility]   To contact the attending provider between 7A-7P or the covering provider during after hours 7P-7A, please log into the web site [email or web address] and access using universal [place] password for that web site. If you do not have the password, please call the hospital operator.  11/20/2019, 10:43 AM

## 2019-11-20 NOTE — Progress Notes (Signed)
Subjective: Interval History: none..   Objective: Vital signs in last 24 hours: Temp:  [97.8 F (36.6 C)-99.6 F (37.6 C)] 98.2 F (36.8 C) (08/22 0529) Pulse Rate:  [78-101] 78 (08/22 0529) Resp:  [12-18] 12 (08/22 0529) BP: (121-177)/(50-82) 140/82 (08/22 0529) SpO2:  [94 %-97 %] 97 % (08/22 0529)  Intake/Output from previous day: 08/21 0701 - 08/22 0700 In: 240 [P.O.:240] Out: -  Intake/Output this shift: No intake/output data recorded.  Incision/Wound:Dressing c/d/i. No DVT  Lab Results: Recent Labs    11/18/19 0516 11/18/19 0516 11/19/19 0602 11/20/19 0518  WBC 7.9  --  9.0  --   HGB 9.0*   < > 10.5* 9.0*  HCT 28.2*   < > 33.1* 27.9*  PLT 298  --  373  --    < > = values in this interval not displayed.   BMET Recent Labs    11/19/19 0602 11/20/19 0518  NA 140 137  K 4.4 3.5  CL 105 106  CO2 22 22  GLUCOSE 92 98  BUN 11 11  CREATININE 0.51 0.52  CALCIUM 9.3 8.8*    Studies/Results: DG Chest 1 View  Result Date: 11/14/2019 CLINICAL DATA:  Fall.  Severe left hip pain. EXAM: CHEST  1 VIEW COMPARISON:  Chest x-ray dated January 26, 2010. FINDINGS: The heart size and mediastinal contours are within normal limits. Normal pulmonary vascularity. Insert elevation of the right hemidiaphragm. No acute osseous abnormality. 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 IMPRESSION: 1. No active disease. Electronically Signed   By: Titus Dubin M.D.   On: 11/14/2019 08:07   DG Tibia/Fibula Right  Result Date: 11/14/2019 CLINICAL DATA:  Suspected hip fracture.  RIGHT lower leg pain. EXAM: RIGHT TIBIA AND FIBULA - 2 VIEW COMPARISON:  None FINDINGS: Osteopenia. Degenerative changes about the RIGHT knee incidentally noted, incompletely imaged. No signs of acute fracture or dislocation. IMPRESSION: 1. No signs of acute fracture or dislocation. 2. Osteopenia and degenerative changes. Electronically Signed   By: Zetta Bills M.D.   On:  11/14/2019 08:01   Pelvis Portable  Result Date: 11/14/2019 CLINICAL DATA:  Status post left hip arthroplasty. EXAM: PORTABLE PELVIS 1-2 VIEWS COMPARISON:  Left hip radiographs and intraoperative fluoroscopic images from 11/14/2019 FINDINGS: Sequelae of left hip hemiarthroplasty are again identified with postoperative gas in the surrounding soft tissues and skin staples in place. The prosthetic femoral head is approximated with the native acetabulum on this single image. No acute fracture is identified. Remote right femoral ORIF is again noted. IMPRESSION: Left hip hemiarthroplasty as above. Electronically Signed   By: Logan Bores M.D.   On: 11/14/2019 14:33   DG Shoulder Left Port  Result Date: 11/14/2019 CLINICAL DATA:  Left shoulder pain after fall. EXAM: LEFT SHOULDER COMPARISON:  None. FINDINGS: No acute fracture or dislocation. Old healed left fifth and sixth rib fractures. Mild acromioclavicular degenerative changes with marginal spurring. Osteopenia. Soft tissues are unremarkable. IMPRESSION: 1. No acute osseous abnormality. Electronically Signed   By: Titus Dubin M.D.   On: 11/14/2019 14:31   DG C-Arm 1-60 Min-No Report  Result Date: 11/14/2019 Fluoroscopy was utilized by the requesting physician.  No radiographic interpretation.   ECHOCARDIOGRAM COMPLETE  Result Date: 11/17/2019    ECHOCARDIOGRAM REPORT   Patient Name:   [name] [name] Date of Exam: 11/17/2019 Medical Rec #:  [id number]    Height:       60.0 in Accession #:  [id number]   Weight:       99.2 lb Date of Birth:  1923-01-28    BSA:          1.386 m Patient Age:    86 years     BP:           147/73 mmHg Patient Gender: F            HR:           89 bpm. Exam Location:  Inpatient Procedure: 2D Echo, Cardiac Doppler and Color Doppler Indications:    I48.0 Paroxysmal atrial fibrillation  History:        Patient has no prior history of Echocardiogram examinations.                 Arrythmias:RBBB; Risk Factors:Hypertension and  Dyslipidemia.  Sonographer:    Jonelle Sidle Dance Referring Phys: [place]  1. Left ventricular ejection fraction, by estimation, is 60 to 65%. The left ventricle has normal function. The left ventricle has no regional wall motion abnormalities. There is mild left ventricular hypertrophy. Left ventricular diastolic parameters are consistent with Grade I diastolic dysfunction (impaired relaxation).  2. Right ventricular systolic function is normal. The right ventricular size is normal. There is normal pulmonary artery systolic pressure.  3. The mitral valve is abnormal. Mild mitral valve regurgitation.  4. The aortic valve is abnormal. Aortic valve regurgitation is not visualized. Mild aortic valve sclerosis is present, with no evidence of aortic valve stenosis.  5. The inferior vena cava is normal in size with greater than 50% respiratory variability, suggesting right atrial pressure of 3 mmHg. FINDINGS  Left Ventricle: Left ventricular ejection fraction, by estimation, is 60 to 65%. The left ventricle has normal function. The left ventricle has no regional wall motion abnormalities. The left ventricular internal cavity size was normal in size. There is  mild left ventricular hypertrophy. Left ventricular diastolic parameters are consistent with Grade I diastolic dysfunction (impaired relaxation). Right Ventricle: The right ventricular size is normal. No increase in right ventricular wall thickness. Right ventricular systolic function is normal. There is normal pulmonary artery systolic pressure. The tricuspid regurgitant velocity is 2.65 m/s, and  with an assumed right atrial pressure of 3 mmHg, the estimated right ventricular systolic pressure is 93.8 mmHg. Left Atrium: Left atrial size was normal in size. Right Atrium: Right atrial size was normal in size. Pericardium: There is no evidence of pericardial effusion. Mitral Valve: The mitral valve is abnormal. Mild mitral annular calcification.  Mild mitral valve regurgitation. Tricuspid Valve: The tricuspid valve is grossly normal. Tricuspid valve regurgitation is mild. Aortic Valve: The aortic valve is abnormal. Aortic valve regurgitation is not visualized. Mild aortic valve sclerosis is present, with no evidence of aortic valve stenosis. Pulmonic Valve: The pulmonic valve was not well visualized. Pulmonic valve regurgitation is not visualized. Aorta: The aortic root and ascending aorta are structurally normal, with no evidence of dilitation. Venous: The inferior vena cava is normal in size with greater than 50% respiratory variability, suggesting right atrial pressure of 3 mmHg. IAS/Shunts: The interatrial septum was not assessed.  LEFT VENTRICLE PLAX 2D LVIDd:         3.90 cm LVIDs:         2.60 cm LV PW:         0.90 cm LV IVS:        1.40 cm LVOT diam:     1.80 cm LV SV:  49 LV SV Index:   35 LVOT Area:     2.54 cm  RIGHT VENTRICLE          IVC RV Basal diam:  2.60 cm  IVC diam: 1.40 cm TAPSE (M-mode): 1.8 cm LEFT ATRIUM             Index       RIGHT ATRIUM          Index LA diam:        4.00 cm 2.89 cm/m  RA Area:     7.78 cm LA Vol (A2C):   49.5 ml 35.72 ml/m RA Volume:   11.70 ml 8.44 ml/m LA Vol (A4C):   59.3 ml 42.80 ml/m LA Biplane Vol: 53.9 ml 38.90 ml/m  AORTIC VALVE LVOT Vmax:   89.65 cm/s LVOT Vmean:  59.200 cm/s LVOT VTI:    0.192 m  AORTA Ao Root diam: 3.10 cm Ao Asc diam:  2.90 cm MITRAL VALVE                TRICUSPID VALVE MV Area (PHT): 3.65 cm     TR Peak grad:   28.1 mmHg MV Decel Time: 208 msec     TR Vmax:        265.00 cm/s MV E velocity: 100.00 cm/s MV A velocity: 98.10 cm/s   SHUNTS MV E/A ratio:  1.02         Systemic VTI:  0.19 m                             Systemic Diam: 1.80 cm Dorris Carnes MD Electronically signed by Dorris Carnes MD Signature Date/Time: 11/17/2019/4:44:49 PM    Final    DG HIP OPERATIVE UNILAT W OR W/O PELVIS LEFT  Result Date: 11/14/2019 CLINICAL DATA:  Elective surgery EXAM: OPERATIVE LEFT  HIP (WITH PELVIS IF PERFORMED) 2 VIEWS TECHNIQUE: Fluoroscopic spot image(s) were submitted for interpretation post-operatively. COMPARISON:  11/14/2019 FLUOROSCOPY TIME 0 minutes 10 seconds Dose: 0.8511 mGy Images: 2 FINDINGS: Osseous demineralization. LEFT hip prosthesis. No acute fracture, dislocation, or bone destruction. IMPRESSION: LEFT hip prosthesis without acute complication. Electronically Signed   By: Lavonia Dana M.D.   On: 11/14/2019 13:25   DG Hip Unilat W or Wo Pelvis 2-3 Views Left  Result Date: 11/14/2019 CLINICAL DATA:  Suspected hip fracture patient from home with un witnessed fall complaining of severe LEFT hip pain EXAM: DG HIP (WITH OR WITHOUT PELVIS) 2-3V LEFT COMPARISON:  May 13, 2018 FINDINGS: Signs of displaced and angulated fracture with varus deformity of the LEFT femoral neck. Superior migration of the LEFT femur. Femoral head remains within the acetabulum. Evidence of prior RIGHT femoral ORIF. Some anterior displacement of distal femur relative to femoral head on the cross-table lateral. Evidence of fracture, previous fracture of the RIGHT inferior pubic ramus. IMPRESSION: 1. Signs of displaced and angulated fracture of the LEFT femoral neck. 2. Prior RIGHT femoral ORIF. 3. Signs of prior fracture of the RIGHT inferior pubic ramus. Electronically Signed   By: Zetta Bills M.D.   On: 11/14/2019 07:52   DG ESOPHAGUS W SINGLE CM (SOL OR THIN BA)  Result Date: 11/18/2019 CLINICAL DATA:  Feeling of food sticking in esophagus. EXAM: ESOPHOGRAM/BARIUM SWALLOW TECHNIQUE: Single contrast examination was performed using  thin barium. FLUOROSCOPY TIME:  Fluoroscopy Time:  1 minutes 12 seconds Radiation Exposure Index (if provided by the fluoroscopic device): 12 mGy Number of Acquired  Spot Images: 7 COMPARISON:  Esophagram 07/10/2014 FINDINGS: Exam is limited due to patient's recent open reduction internal fixation a hip fracture. Oral contrast flowed readily through the esophagus  into the stomach. The distal esophagus is extremely tortuous. This finding is similar to but slightly progressed from comparison exam 2016. There is a moderate size hiatal hernia. No high-grade obstruction.  No reflux demonstrated. IMPRESSION: 1. Extremely tortuous distal esophagus presumably accounts for feeling of slow passage of food in the esophagus. Recommend small portion instructions. 2. No high-grade obstruction. 3. Moderate hiatal hernia. 4. Findings similar to but mildly progressed from esophagram of 2016. Electronically Signed   By: Suzy Bouchard M.D.   On: 11/18/2019 14:50   Anti-infectives: Anti-infectives (From admission, onward)   Start     Dose/Rate Route Frequency Ordered Stop   11/14/19 1005  ceFAZolin (ANCEF) 2-4 GM/100ML-% IVPB       Note to Pharmacy: Marchia Meiers   : cabinet override      11/14/19 1005 11/14/19 1201   11/14/19 1000  ceFAZolin (ANCEF) IVPB 2g/100 mL premix        2 g 200 mL/hr over 30 Minutes Intravenous On call to O.R. 11/14/19 0959 11/14/19 1130      Assessment/Plan: s/p Procedure(s): LEFT HIP HEMIARTHROPLASTY ANTERIOR APPROACH (Left) Doing well POD #1  OOB  PO   LOS: 6 days   Johnn Hai 11/20/2019, 8:39 AM

## 2019-11-21 LAB — BASIC METABOLIC PANEL
Anion gap: 12 (ref 5–15)
BUN: 10 mg/dL (ref 8–23)
CO2: 25 mmol/L (ref 22–32)
Calcium: 9.5 mg/dL (ref 8.9–10.3)
Chloride: 105 mmol/L (ref 98–111)
Creatinine, Ser: 0.54 mg/dL (ref 0.44–1.00)
GFR calc Af Amer: >60 mL/min (ref >60)
GFR calc non Af Amer: >60 mL/min (ref >60)
Glucose, Bld: 97 mg/dL (ref 70–99)
Potassium: 4.1 mmol/L (ref 3.5–5.1)
Sodium: 142 mmol/L (ref 135–145)

## 2019-11-21 LAB — SARS CORONAVIRUS 2 BY RT PCR (HOSPITAL ORDER, PERFORMED IN ~~LOC~~ HOSPITAL LAB): SARS Coronavirus 2: NEGATIVE

## 2019-11-21 LAB — HEMOGLOBIN AND HEMATOCRIT, BLOOD
HCT: 32.5 % — ABNORMAL LOW (ref 36.0–46.0)
Hemoglobin: 9.9 g/dL — ABNORMAL LOW (ref 12.0–15.0)

## 2019-11-21 MED ORDER — APIXABAN 2.5 MG PO TABS: 2.5000 mg | ORAL_TABLET | Freq: Two times a day (BID) | ORAL | Status: DC

## 2019-11-21 NOTE — Progress Notes (Signed)
PROGRESS NOTE    [name] [name]  SAY:[id number] DOB: 04-Dec-1922 DOA: 11/14/2019 PCP: Lajean Manes, MD    Chief Complaint  Patient presents with  . Hip Injury    Brief Narrative:  Patient 86 year old lady who was admitted with mechanical fall while picking up trash can and sustained a right hip fracture status post repair 11/14/2019.  Seen by PT who are recommending SNF placement.   Assessment & Plan:   Principal Problem:   Closed left hip fracture, initial encounter [facility]) Active Problems:   Hypokalemia   Pre-op evaluation   Prolonged QT interval   Noninfected skin tear of lower extremity, left, initial encounter   Goals of care, counseling/discussion   DNR (do not resuscitate)   Palliative care by specialist   Postoperative anemia due to acute blood loss   Essential hypertension   Fall at home   Anemia due to vitamin B12 deficiency   Dysphagia   New onset atrial fibrillation (HCC)  1 left hip fracture status post left hip hemiarthroplasty 11/14/2019 per Dr. Lyla Glassing Secondary to mechanical fall.  Patient states adequate pain control at this time.  Continue current regimen of scheduled Tylenol, Norco as needed, IV morphine as needed severe pain.  Patient seen by PT/OT who are recommending SNF placement.  Orthopedics recommending Eliquis on discharge for DVT prophylaxis.  Orthopedics following.  2.  New onset A. Fib/?  Paroxysmal atrial tachycardia versus SVT Patient noted to go into atrial fibrillation overnight.  Patient with no prior history of atrial fibrillation.  Cardiac enzymes obtained negative.  2D echo ordered and EF of 60 to 65%, no wall motion abnormalities, grade 1 diastolic dysfunction, mild LVH, mild MVR, left atrial size is normal..  Patient seen in consultation by cardiology and patient started on low-dose beta-blocker to prevent patient from going back into A. fib with RVR.  Patient noted to have some bursts of SVT and what seems like paroxysmal atrial tachycardia  per cardiology and as such beta-blocker dose increased.  Patient noted to have a 20 beat run of SVT the evening of 11/19/2018.  Lopressor dose given early.  No further runs of SVT noted.  Cardiology not recommending anticoagulation at this time given short nature of patient's A. fib and inciting event of hip fracture.  Patient currently on aspirin for DVT prophylaxis per orthopedics for hip surgery.  Orthopedics recommending Eliquis on discharge for DVT prophylaxis.  Follow.  3.  Hypertension Patient noted to be soft early on during the hospitalization.  Norvasc discontinued.  Patient started on beta-blocker secondary to A. fib/paroxysmal atrial tachycardia.  Supportive care.  Follow.   4.  QTc prolongation Repeat EKG with resolution of QTC prolongation.  5.  Hypokalemia Repleted.  Potassium at 4.1.  Potassium packet 40 mEq p.o. x1 as patient noted to have some runs of SVT on 11/19/2019.  Magnesium at 2.  Patient with new onset A. fib versus paroxysmal atrial tachycardia, and as such we will try to keep potassium close to 4.  6.  Dehydration Hydrated with IV fluids.  Fluids have been saline lock.  Follow.    7.  Reactive leukocytosis Resolved.  8.  Postop acute blood loss anemia/anemia of chronic disease/vitamin B12 deficiency. Patient with no overt bleeding.  Hemoglobin currently at 9.9  IV fluids have been saline locked.  Anemia panel with iron of 14, TIBC of 228, ferritin of 187, folate of 8.9, vitamin B12 of 159.  Continue vitamin B12 1000 MCG subcutaneously daily x 3 days,  then weekly x1 month, then monthly.  Status post IV Feraheme.  Will likely need oral iron supplementation on discharge. Follow.  9.  Dysphagia secondary to tortuous esophagus Patient with complaints of difficulty swallowing feels like food may be getting stuck however patient also with significant burping.  Improving clinically.  Patient underwent a barium esophagram that showed an extremely tortuous distal esophagus  presumably accounting for slow passage of food in the esophagus, no high-grade obstruction, moderate hiatal hernia, findings similar but mildly progressed from esophagram in 2016.  Diet modifications made per speech therapy.  Patient with a bout of nausea and emesis this morning.  Will have speech therapy reassess to see if any modifications need to be made in patient's diet.  Will likely need outpatient speech following.  Outpatient follow-up.     DVT prophylaxis: Per orthopedics/Lovenox Code Status: DNR Family Communication: Updated patient and daughter-in-law at bedside. Disposition:   Status is: Inpatient    Dispo: The patient is from: Home              Anticipated d/c is to: SNF              Anticipated d/c date is: 11/22/2019              Patient noted to have gone into atrial fibrillation the night of 11/16/2019, has been assessed by cardiology for A. fib.  Some bouts of SVT and paroxysmal atrial tachycardia.  Dysphagia improving.  Not stable for discharge.        Consultants:   Orthopedics: Dr. Lyla Glassing 11/14/2019  Palliative care: Vinie Sill, NP 7/35/3299  Cardiology: Dr. Marlou Porch 11/17/2019  Procedures:   Plain films of the left shoulder 11/14/2019  Plain films of the pelvis 11/14/2019  Chest x-ray 11/14/2019  Plain films of the left hip and pelvis 11/14/2019  Plain films of the right tib-fib 11/14/2019  Plain films of the pelvis 11/14/2019  Left hip hemiarthroplasty per Dr. Lyla Glassing 11/14/2019  2D echo 11/17/2019  Barium esophagram 11/18/2019  Antimicrobials:   None   Subjective: Patient sitting up in chair.  Noted to have a bout of emesis and nausea this morning with toes noted.  Patient denies any chest pain or shortness of breath.  No abdominal pain.    Objective: Vitals:   11/20/19 1220 11/20/19 2121 11/21/19 0528 11/21/19 1356  BP: (!) 113/53 (!) 141/67 (!) 124/57 (!) 102/57  Pulse: 72 78 61 97  Resp: 17 15 14 18   Temp: 97.7 F (36.5 C) 98.2 F  (36.8 C) 98.1 F (36.7 C) 98.4 F (36.9 C)  TempSrc: Oral Oral Oral Oral  SpO2: 100% 96% 97% 96%  Weight:      Height:        Intake/Output Summary (Last 24 hours) at 11/21/2019 1619 Last data filed at 11/21/2019 0500 Gross per 24 hour  Intake --  Output 400 ml  Net -400 ml   Filed Weights   11/14/19 0645  Weight: 45 kg    Examination:  General exam: NAD Respiratory system: CTAB.  No wheezes, no crackles, no rhonchi.  Normal respiratory effort.  Speaking in full sentences.   Cardiovascular system: Irregularly irregular.  No murmurs rubs or gallops.  No JVD.  No lower extremity edema.   Gastrointestinal system: Abdomen is soft, nontender, nondistended, positive bowel sounds.  No rebound.  No guarding.  Central nervous system: Alert and oriented. No focal neurological deficits. Extremities: Symmetric 5 x 5 power. Skin: No rashes, lesions or ulcers Psychiatry:  Judgement and insight appear normal. Mood & affect appropriate.     Data Reviewed: I have personally reviewed following labs and imaging studies  CBC: Recent Labs  Lab 11/15/19 0320 11/15/19 0320 11/16/19 0304 11/16/19 0304 11/17/19 0302 11/18/19 0516 11/19/19 0602 11/20/19 0518 11/21/19 0600  WBC 8.7  --  9.2  --  9.1 7.9 9.0  --   --   HGB 9.4*   < > 10.4*   < > 9.7* 9.0* 10.5* 9.0* 9.9*  HCT 30.1*   < > 33.7*   < > 30.5* 28.2* 33.1* 27.9* 32.5*  MCV 93.5  --  94.4  --  92.1 91.3 92.7  --   --   PLT 203  --  226  --  250 298 373  --   --    < > = values in this interval not displayed.    Basic Metabolic Panel: Recent Labs  Lab 11/17/19 0302 11/17/19 0819 11/18/19 0516 11/19/19 0602 11/20/19 0518 11/21/19 0600  NA 137  --  138 140 137 142  K 3.6  --  3.5 4.4 3.5 4.1  CL 108  --  107 105 106 105  CO2 22  --  21* 22 22 25   GLUCOSE 115*  --  91 92 98 97  BUN 10  --  12 11 11 10   CREATININE 0.41*  --  0.42* 0.51 0.52 0.54  CALCIUM 8.2*  --  8.7* 9.3 8.8* 9.5  MG  --  2.2 2.0  --   --   --      GFR: Estimated Creatinine Clearance: 29.9 mL/min (by C-G formula based on SCr of 0.54 mg/dL).  Liver Function Tests: No results for input(s): AST, ALT, ALKPHOS, BILITOT, PROT, ALBUMIN in the last 168 hours.  CBG: No results for input(s): GLUCAP in the last 168 hours.   Recent Results (from the past 240 hour(s))  SARS Coronavirus 2 by RT PCR (hospital order, performed in [facility] hospital lab) Nasopharyngeal Nasopharyngeal Swab     Status: None   Collection Time: 11/14/19  7:44 AM   Specimen: Nasopharyngeal Swab  Result Value Ref Range Status   SARS Coronavirus 2 NEGATIVE NEGATIVE Final    Comment: (NOTE) SARS-CoV-2 target nucleic acids are NOT DETECTED.  The SARS-CoV-2 RNA is generally detectable in upper and lower respiratory specimens during the acute phase of infection. The lowest concentration of SARS-CoV-2 viral copies this assay can detect is 250 copies / mL. A negative result does not preclude SARS-CoV-2 infection and should not be used as the sole basis for treatment or other patient management decisions.  A negative result may occur with improper specimen collection / handling, submission of specimen other than nasopharyngeal swab, presence of viral mutation(s) within the areas targeted by this assay, and inadequate number of viral copies (<250 copies / mL). A negative result must be combined with clinical observations, patient history, and epidemiological information.  Fact Sheet for Patients:   StrictlyIdeas.no  Fact Sheet for Healthcare Providers: BankingDealers.co.za  This test is not yet approved or  cleared by the [place] FDA and has been authorized for detection and/or diagnosis of SARS-CoV-2 by FDA under an Emergency Use Authorization (EUA).  This EUA will remain in effect (meaning this test can be used) for the duration of the COVID-19 declaration under Section 564(b)(1) of the Act, 21  U.S.C. section 360bbb-3(b)(1), unless the authorization is terminated or revoked sooner.  Performed at [facility], [place]  [address] [place], [place] [address]   Surgical pcr screen     Status: None   Collection Time: 11/14/19 10:32 AM   Specimen: Nasal Mucosa; Nasal Swab  Result Value Ref Range Status   MRSA, PCR NEGATIVE NEGATIVE Final   Staphylococcus aureus NEGATIVE NEGATIVE Final    Comment: (NOTE) The Xpert SA Assay (FDA approved for NASAL specimens in patients 39 years of age and older), is one component of a comprehensive surveillance program. It is not intended to diagnose infection nor to guide or monitor treatment. Performed at [facility], [place] [address] [place], [place] [address]   SARS Coronavirus 2 by RT PCR (hospital order, performed in [facility] hospital lab) Nasopharyngeal Nasopharyngeal Swab     Status: None   Collection Time: 11/21/19  1:10 PM   Specimen: Nasopharyngeal Swab  Result Value Ref Range Status   SARS Coronavirus 2 NEGATIVE NEGATIVE Final    Comment: (NOTE) SARS-CoV-2 target nucleic acids are NOT DETECTED.  The SARS-CoV-2 RNA is generally detectable in upper and lower respiratory specimens during the acute phase of infection. The lowest concentration of SARS-CoV-2 viral copies this assay can detect is 250 copies / mL. A negative result does not preclude SARS-CoV-2 infection and should not be used as the sole basis for treatment or other patient management decisions.  A negative result may occur with improper specimen collection / handling, submission of specimen other than nasopharyngeal swab, presence of viral mutation(s) within the areas targeted by this assay, and inadequate number of viral copies (<250 copies / mL). A negative result must be combined with clinical observations, patient history, and epidemiological information.  Fact Sheet for Patients:    StrictlyIdeas.no  Fact Sheet for Healthcare Providers: BankingDealers.co.za  This test is not yet approved or  cleared by the [place] FDA and has been authorized for detection and/or diagnosis of SARS-CoV-2 by FDA under an Emergency Use Authorization (EUA).  This EUA will remain in effect (meaning this test can be used) for the duration of the COVID-19 declaration under Section 564(b)(1) of the Act, 21 U.S.C. section 360bbb-3(b)(1), unless the authorization is terminated or revoked sooner.  Performed at [facility], [place] [address], [place], [place] [address]          Radiology Studies: No results found.      Scheduled Meds: . [START ON 11/22/2019] apixaban  2.5 mg Oral BID  . cyanocobalamin  1,000 mcg Subcutaneous Daily  . docusate sodium  100 mg Oral BID  . enoxaparin (LOVENOX) injection  30 mg Subcutaneous Q24H  . metoprolol tartrate  25 mg Oral BID  . pantoprazole  40 mg Oral Q0600  . polyethylene glycol  17 g Oral Daily   Continuous Infusions: . methocarbamol (ROBAXIN) IV Stopped (11/14/19 1532)     LOS: 7 days    Time spent: 35 minutes    Irine Seal, MD [facility]   To contact the attending provider between 7A-7P or the covering provider during after hours 7P-7A, please log into the web site [email or web address] and access using universal [place] password for that web site. If you do not have the password, please call the hospital operator.  11/21/2019, 4:19 PM

## 2019-11-21 NOTE — TOC Progression Note (Addendum)
Transition of Care [facility]) - Progression Note    Patient Details  Name: [name] [name] MRN: [id number] Date of Birth: 1922-08-26  [facility] of [facility]) CM/SW Contact  Coy Rochford, Juliann Pulse, RN Phone Number: 11/21/2019, 9:50 AM  Clinical Narrative:  [facility], will need d/c summary by 2p on day of d/c, covid ordered.  Expected Discharge Plan: [place] Barriers to Discharge: Continued Medical Work up  Expected Discharge Plan and Services Expected Discharge Plan: [place]   Discharge Planning Services: CM Consult Post Acute Care Choice: [place] Living arrangements for the past 2 months: Single Family Home                 DME Arranged: N/A DME Agency: NA       HH Arranged: NA HH Agency: NA         Social Determinants of Health (SDOH) Interventions    Readmission Risk Interventions No flowsheet data found.

## 2019-11-21 NOTE — Progress Notes (Signed)
Patient had emesis episodes. Patient stated a piece of food got stuck and she feels like there is more. Patient was able to get up several pieces of food that resembles toast she was having for breakfast. Zofran was given. Patient is continuing to bring up thick clear phlegm and states she is feeling better. Will continue to monitor.

## 2019-11-21 NOTE — Care Management Important Message (Signed)
Important Message  Patient Details IM Letter given to the Patient Name: [name] [name] MRN: [id number] Date of Birth: 1923/01/25   Medicare Important Message Given:  Yes     Kerin Salen 11/21/2019, 10:57 AM

## 2019-11-21 NOTE — Discharge Instructions (Signed)
Dr. Rod Can Joint Replacement Specialist [facility] [address] [place], [place] [address] [phone]   TOTAL HIP REPLACEMENT POSTOPERATIVE DIRECTIONS    Hip Rehabilitation, Guidelines Following Surgery   WEIGHT BEARING Weight bearing as tolerated with assist device (walker, cane, etc) as directed, use it as long as suggested by your surgeon or therapist, typically at least 4-6 weeks.  The results of a hip operation are greatly improved after range of motion and muscle strengthening exercises. Follow all safety measures which are given to protect your hip. If any of these exercises cause increased pain or swelling in your joint, decrease the amount until you are comfortable again. Then slowly increase the exercises. Call your caregiver if you have problems or questions.   HOME CARE INSTRUCTIONS  Most of the following instructions are designed to prevent the dislocation of your new hip.  Remove items at home which could result in a fall. This includes throw rugs or furniture in walking pathways.  Continue medications as instructed at time of discharge.  You may have some home medications which will be placed on hold until you complete the course of blood thinner medication.  You may start showering once you are discharged home. Do not remove your dressing. Do not put on socks or shoes without following the instructions of your caregivers.   Sit on chairs with arms. Use the chair arms to help push yourself up when arising.  Arrange for the use of a toilet seat elevator so you are not sitting low.   Walk with walker as instructed.  You may resume a sexual relationship in one month or when given the OK by your caregiver.  Use walker as long as suggested by your caregivers.  You may put full weight on your legs and walk as much as is comfortable. Avoid periods of inactivity such as sitting longer than an hour when not asleep. This helps prevent  blood clots.  You may return to work once you are cleared by Engineer, production.  Do not drive a car for 6 weeks or until released by your surgeon.  Do not drive while taking narcotics.  Wear elastic stockings for two weeks following surgery during the day but you may remove then at night.  Make sure you keep all of your appointments after your operation with all of your doctors and caregivers. You should call the office at the above phone number and make an appointment for approximately two weeks after the date of your surgery. Please pick up a stool softener and laxative for home use as long as you are requiring pain medications.  ICE to the affected hip every three hours for 30 minutes at a time and then as needed for pain and swelling. Continue to use ice on the hip for pain and swelling from surgery. You may notice swelling that will progress down to the foot and ankle.  This is normal after surgery.  Elevate the leg when you are not up walking on it.   It is important for you to complete the blood thinner medication as prescribed by your doctor.  Continue to use the breathing machine which will help keep your temperature down.  It is common for your temperature to cycle up and down following surgery, especially at night when you are not up moving around and exerting yourself.  The breathing machine keeps your lungs expanded and your temperature down.  RANGE OF MOTION AND STRENGTHENING EXERCISES  These exercises  are designed to help you keep full movement of your hip joint. Follow your caregiver's or physical therapist's instructions. Perform all exercises about fifteen times, three times per day or as directed. Exercise both hips, even if you have had only one joint replacement. These exercises can be done on a training (exercise) mat, on the floor, on a table or on a bed. Use whatever works the best and is most comfortable for you. Use music or television while you are exercising so that the exercises  are a pleasant break in your day. This will make your life better with the exercises acting as a break in routine you can look forward to.  Lying on your back, slowly slide your foot toward your buttocks, raising your knee up off the floor. Then slowly slide your foot back down until your leg is straight again.  Lying on your back spread your legs as far apart as you can without causing discomfort.  Lying on your side, raise your upper leg and foot straight up from the floor as far as is comfortable. Slowly lower the leg and repeat.  Lying on your back, tighten up the muscle in the front of your thigh (quadriceps muscles). You can do this by keeping your leg straight and trying to raise your heel off the floor. This helps strengthen the largest muscle supporting your knee.  Lying on your back, tighten up the muscles of your buttocks both with the legs straight and with the knee bent at a comfortable angle while keeping your heel on the floor.   SKILLED REHAB INSTRUCTIONS: If the patient is transferred to a skilled rehab facility following release from the hospital, a list of the current medications will be sent to the facility for the patient to continue.  When discharged from the skilled rehab facility, please have the facility set up the patient's [place] prior to being released. Also, the skilled facility will be responsible for providing the patient with their medications at time of release from the facility to include their pain medication and their blood thinner medication. If the patient is still at the rehab facility at time of the two week follow up appointment, the skilled rehab facility will also need to assist the patient in arranging follow up appointment in our office and any transportation needs.  MAKE SURE YOU:  Understand these instructions.  Will watch your condition.  Will get help right away if you are not doing well or get worse.  Pick up stool softner and  laxative for home use following surgery while on pain medications. Do not remove your dressing. The dressing is waterproof--it is OK to take showers. Continue to use ice for pain and swelling after surgery. Do not use any lotions or creams on the incision until instructed by your surgeon. Total Hip Protocol.    ------------------------------------------------------------------------------------------------------------------------------------------------------------------------------------------------------------------------    Information on my medicine - ELIQUIS (apixaban)  This medication education was reviewed with me or my healthcare representative as part of my discharge preparation.  The pharmacist that spoke with me during my hospital stay was:  Penni Homans, Student-PharmD  Why was Eliquis prescribed for you? Eliquis was prescribed for you to reduce the risk of a blood clot forming that can cause a stroke if you have a medical condition called atrial fibrillation (a type of irregular heartbeat).  What do You need to know about Eliquis ? Take your Eliquis TWICE DAILY - one tablet in the morning and one tablet  in the evening with or without food. If you have difficulty swallowing the tablet whole please discuss with your pharmacist how to take the medication safely.  Take Eliquis exactly as prescribed by your doctor and DO NOT stop taking Eliquis without talking to the doctor who prescribed the medication.  Stopping may increase your risk of developing a stroke.  Refill your prescription before you run out.  After discharge, you should have regular check-up appointments with your healthcare provider that is prescribing your Eliquis.  In the future your dose may need to be changed if your kidney function or weight changes by a significant amount or as you get older.  What do you do if you miss a dose? If you miss a dose, take it as soon as you remember on the same day and  resume taking twice daily.  Do not take more than one dose of ELIQUIS at the same time to make up a missed dose.  Important Safety Information A possible side effect of Eliquis is bleeding. You should call your healthcare provider right away if you experience any of the following: ? Bleeding from an injury or your nose that does not stop. ? Unusual colored urine (red or dark brown) or unusual colored stools (red or black). ? Unusual bruising for unknown reasons. ? A serious fall or if you hit your head (even if there is no bleeding).  Some medicines may interact with Eliquis and might increase your risk of bleeding or clotting while on Eliquis. To help avoid this, consult your healthcare provider or pharmacist prior to using any new prescription or non-prescription medications, including herbals, vitamins, non-steroidal anti-inflammatory drugs (NSAIDs) and supplements.  This website has more information on Eliquis (apixaban): http://www.eliquis.com/eliquis/home

## 2019-11-21 NOTE — Plan of Care (Signed)
  Problem: Education: Goal: Knowledge of General Education information will improve Description: Including pain rating scale, medication(s)/side effects and non-pharmacologic comfort measures Outcome: Progressing   Problem: Health Behavior/Discharge Planning: Goal: Ability to manage health-related needs will improve Outcome: Progressing   Problem: Clinical Measurements: Goal: Ability to maintain clinical measurements within normal limits will improve Outcome: Progressing Goal: Will remain free from infection Outcome: Progressing Goal: Diagnostic test results will improve Outcome: Progressing Goal: Respiratory complications will improve Outcome: Progressing Goal: Cardiovascular complication will be avoided Outcome: Progressing   Problem: Elimination: Goal: Will not experience complications related to urinary retention Outcome: Progressing   Problem: Pain Managment: Goal: General experience of comfort will improve Outcome: Progressing   Problem: Safety: Goal: Ability to remain free from injury will improve Outcome: Progressing   Problem: Skin Integrity: Goal: Risk for impaired skin integrity will decrease Outcome: Progressing

## 2019-11-21 NOTE — Progress Notes (Signed)
Physical Therapy Treatment Patient Details Name: [name] [name] MRN: [id number] DOB: 10/22/1922 Today's Date: 11/21/2019    History of Present Illness 86 year old female admitted after fall resulting in L femoral neck fracture. Patient s/p L hip hemi direct anterior approach 11/14/19. PMH compression fx 07/2019, OA, vertigo, RBBB    PT Comments    Progressing slowly with mobility. Mobility remains limited by pain. Continue to recommend [facility] for rehab.    Follow Up Recommendations  SNF     Equipment Recommendations       Recommendations for Other Services       Precautions / Restrictions Precautions Precautions: Fall Restrictions Weight Bearing Restrictions: No    Mobility  Bed Mobility Overal bed mobility: Needs Assistance Bed Mobility: Supine to Sit     Supine to sit: Mod assist     General bed mobility comments: Assist for trunk and to scoot to EOB. Increased time. Cues for technique.  Transfers Overall transfer level: Needs assistance Equipment used: Rolling walker (2 wheeled) Transfers: Sit to/from Stand Sit to Stand: Mod assist Stand pivot transfers: Min assist       General transfer comment: Assist to rise, stabilize, control descent. Cues for safety, technique, hand placement. Stand pivot, bed to bsc.  Ambulation/Gait Ambulation/Gait assistance: Min assist;+2 safety/equipment Gait Distance (Feet): 5 Feet Assistive device: Rolling walker (2 wheeled) Gait Pattern/deviations: Trunk flexed;Antalgic;Shuffle     General Gait Details: Assist to stabilize pt throughout short distance. Cues for safety, posture, proper use of RW. Distance limited by pain.   Stairs             Wheelchair Mobility    Modified Rankin (Stroke Patients Only)       Balance Overall balance assessment: Needs assistance;History of Falls         Standing balance support: Bilateral upper extremity supported Standing balance-Leahy Scale: Poor                               Cognition Arousal/Alertness: Awake/alert Behavior During Therapy: WFL for tasks assessed/performed Overall Cognitive Status: Within Functional Limits for tasks assessed                                        Exercises      General Comments        Pertinent Vitals/Pain Pain Assessment: 0-10 Faces Pain Scale: Hurts even more Pain Location: L hip with activity; R shoulder Pain Descriptors / Indicators: Grimacing;Aching;Sharp Pain Intervention(s): Limited activity within patient's tolerance;Monitored during session;Repositioned    Home Living                      Prior Function            PT Goals (current goals can now be found in the care plan section) Progress towards PT goals: Progressing toward goals    Frequency    Min 3X/week      PT Plan Current plan remains appropriate    Co-evaluation              AM-PAC PT "6 Clicks" Mobility   Outcome Measure  Help needed turning from your back to your side while in a flat bed without using bedrails?: A Little Help needed moving from lying on your back to sitting on the side of a flat  bed without using bedrails?: A Lot Help needed moving to and from a bed to a chair (including a wheelchair)?: A Lot Help needed standing up from a chair using your arms (e.g., wheelchair or bedside chair)?: A Lot Help needed to walk in hospital room?: A Lot Help needed climbing 3-5 steps with a railing? : Total 6 Click Score: 12    End of Session Equipment Utilized During Treatment: Gait belt Activity Tolerance: Patient limited by fatigue;Patient limited by pain Patient left: in chair;with call bell/phone within reach;with chair alarm set   PT Visit Diagnosis: Muscle weakness (generalized) (M62.81);History of falling (Z91.81);Other abnormalities of gait and mobility (R26.89);Pain Pain - Right/Left: Left Pain - part of body: Hip (+ R shoulder)     Time: 5830-9407 PT Time Calculation  (min) (ACUTE ONLY): 26 min  Charges:  $Gait Training: 8-22 mins $Therapeutic Activity: 8-22 mins                        Doreatha Massed, PT Acute Rehabilitation  Office: [phone] Pager: [phone]

## 2019-11-22 DIAGNOSIS — M810 Age-related osteoporosis without current pathological fracture: Secondary | ICD-10-CM | POA: Diagnosis not present

## 2019-11-22 DIAGNOSIS — R195 Other fecal abnormalities: Secondary | ICD-10-CM | POA: Diagnosis not present

## 2019-11-22 DIAGNOSIS — R279 Unspecified lack of coordination: Secondary | ICD-10-CM | POA: Diagnosis not present

## 2019-11-22 DIAGNOSIS — R41841 Cognitive communication deficit: Secondary | ICD-10-CM | POA: Diagnosis not present

## 2019-11-22 DIAGNOSIS — R9431 Abnormal electrocardiogram [ECG] [EKG]: Secondary | ICD-10-CM | POA: Diagnosis not present

## 2019-11-22 DIAGNOSIS — S72032D Displaced midcervical fracture of left femur, subsequent encounter for closed fracture with routine healing: Secondary | ICD-10-CM | POA: Diagnosis not present

## 2019-11-22 DIAGNOSIS — Y9389 Activity, other specified: Secondary | ICD-10-CM | POA: Diagnosis not present

## 2019-11-22 DIAGNOSIS — D62 Acute posthemorrhagic anemia: Secondary | ICD-10-CM | POA: Diagnosis not present

## 2019-11-22 DIAGNOSIS — D513 Other dietary vitamin B12 deficiency anemia: Secondary | ICD-10-CM | POA: Diagnosis not present

## 2019-11-22 DIAGNOSIS — E43 Unspecified severe protein-calorie malnutrition: Secondary | ICD-10-CM | POA: Diagnosis not present

## 2019-11-22 DIAGNOSIS — Z9181 History of falling: Secondary | ICD-10-CM | POA: Diagnosis not present

## 2019-11-22 DIAGNOSIS — R1310 Dysphagia, unspecified: Secondary | ICD-10-CM | POA: Diagnosis not present

## 2019-11-22 DIAGNOSIS — D509 Iron deficiency anemia, unspecified: Secondary | ICD-10-CM | POA: Diagnosis not present

## 2019-11-22 DIAGNOSIS — S81812A Laceration without foreign body, left lower leg, initial encounter: Secondary | ICD-10-CM | POA: Diagnosis not present

## 2019-11-22 DIAGNOSIS — G3184 Mild cognitive impairment, so stated: Secondary | ICD-10-CM | POA: Diagnosis not present

## 2019-11-22 DIAGNOSIS — M1990 Unspecified osteoarthritis, unspecified site: Secondary | ICD-10-CM | POA: Diagnosis not present

## 2019-11-22 DIAGNOSIS — M25552 Pain in left hip: Secondary | ICD-10-CM | POA: Diagnosis not present

## 2019-11-22 DIAGNOSIS — Z66 Do not resuscitate: Secondary | ICD-10-CM | POA: Diagnosis not present

## 2019-11-22 DIAGNOSIS — I4510 Unspecified right bundle-branch block: Secondary | ICD-10-CM | POA: Diagnosis not present

## 2019-11-22 DIAGNOSIS — F29 Unspecified psychosis not due to a substance or known physiological condition: Secondary | ICD-10-CM | POA: Diagnosis not present

## 2019-11-22 DIAGNOSIS — K589 Irritable bowel syndrome without diarrhea: Secondary | ICD-10-CM | POA: Diagnosis not present

## 2019-11-22 DIAGNOSIS — Z743 Need for continuous supervision: Secondary | ICD-10-CM | POA: Diagnosis not present

## 2019-11-22 DIAGNOSIS — I471 Supraventricular tachycardia: Secondary | ICD-10-CM | POA: Diagnosis not present

## 2019-11-22 DIAGNOSIS — R2681 Unsteadiness on feet: Secondary | ICD-10-CM | POA: Diagnosis not present

## 2019-11-22 DIAGNOSIS — Z7189 Other specified counseling: Secondary | ICD-10-CM | POA: Diagnosis not present

## 2019-11-22 DIAGNOSIS — M6281 Muscle weakness (generalized): Secondary | ICD-10-CM | POA: Diagnosis not present

## 2019-11-22 DIAGNOSIS — S72002D Fracture of unspecified part of neck of left femur, subsequent encounter for closed fracture with routine healing: Secondary | ICD-10-CM | POA: Diagnosis not present

## 2019-11-22 DIAGNOSIS — R5381 Other malaise: Secondary | ICD-10-CM | POA: Diagnosis not present

## 2019-11-22 DIAGNOSIS — R634 Abnormal weight loss: Secondary | ICD-10-CM | POA: Diagnosis not present

## 2019-11-22 DIAGNOSIS — I959 Hypotension, unspecified: Secondary | ICD-10-CM | POA: Diagnosis not present

## 2019-11-22 DIAGNOSIS — J069 Acute upper respiratory infection, unspecified: Secondary | ICD-10-CM | POA: Diagnosis not present

## 2019-11-22 DIAGNOSIS — Z23 Encounter for immunization: Secondary | ICD-10-CM | POA: Diagnosis not present

## 2019-11-22 DIAGNOSIS — D519 Vitamin B12 deficiency anemia, unspecified: Secondary | ICD-10-CM | POA: Diagnosis not present

## 2019-11-22 DIAGNOSIS — F321 Major depressive disorder, single episode, moderate: Secondary | ICD-10-CM | POA: Diagnosis not present

## 2019-11-22 DIAGNOSIS — Y92019 Unspecified place in single-family (private) house as the place of occurrence of the external cause: Secondary | ICD-10-CM | POA: Diagnosis not present

## 2019-11-22 DIAGNOSIS — I69828 Other speech and language deficits following other cerebrovascular disease: Secondary | ICD-10-CM | POA: Diagnosis not present

## 2019-11-22 DIAGNOSIS — R2689 Other abnormalities of gait and mobility: Secondary | ICD-10-CM | POA: Diagnosis not present

## 2019-11-22 DIAGNOSIS — R42 Dizziness and giddiness: Secondary | ICD-10-CM | POA: Diagnosis not present

## 2019-11-22 DIAGNOSIS — S72002A Fracture of unspecified part of neck of left femur, initial encounter for closed fracture: Secondary | ICD-10-CM | POA: Diagnosis not present

## 2019-11-22 DIAGNOSIS — S81811D Laceration without foreign body, right lower leg, subsequent encounter: Secondary | ICD-10-CM | POA: Diagnosis not present

## 2019-11-22 DIAGNOSIS — W010XXA Fall on same level from slipping, tripping and stumbling without subsequent striking against object, initial encounter: Secondary | ICD-10-CM | POA: Diagnosis not present

## 2019-11-22 DIAGNOSIS — I4891 Unspecified atrial fibrillation: Secondary | ICD-10-CM | POA: Diagnosis not present

## 2019-11-22 DIAGNOSIS — E785 Hyperlipidemia, unspecified: Secondary | ICD-10-CM | POA: Diagnosis not present

## 2019-11-22 DIAGNOSIS — E876 Hypokalemia: Secondary | ICD-10-CM | POA: Diagnosis not present

## 2019-11-22 DIAGNOSIS — Z96642 Presence of left artificial hip joint: Secondary | ICD-10-CM | POA: Diagnosis not present

## 2019-11-22 DIAGNOSIS — I10 Essential (primary) hypertension: Secondary | ICD-10-CM | POA: Diagnosis not present

## 2019-11-22 DIAGNOSIS — K224 Dyskinesia of esophagus: Secondary | ICD-10-CM | POA: Diagnosis not present

## 2019-11-22 DIAGNOSIS — F411 Generalized anxiety disorder: Secondary | ICD-10-CM | POA: Diagnosis not present

## 2019-11-22 LAB — BASIC METABOLIC PANEL
Anion gap: 11 (ref 5–15)
BUN: 11 mg/dL (ref 8–23)
CO2: 26 mmol/L (ref 22–32)
Calcium: 9.7 mg/dL (ref 8.9–10.3)
Chloride: 102 mmol/L (ref 98–111)
Creatinine, Ser: 0.65 mg/dL (ref 0.44–1.00)
GFR calc Af Amer: >60 mL/min (ref >60)
GFR calc non Af Amer: >60 mL/min (ref >60)
Glucose, Bld: 106 mg/dL — ABNORMAL HIGH (ref 70–99)
Potassium: 4.7 mmol/L (ref 3.5–5.1)
Sodium: 139 mmol/L (ref 135–145)

## 2019-11-22 LAB — MAGNESIUM: Magnesium: 2.2 mg/dL (ref 1.7–2.4)

## 2019-11-22 MED ORDER — APIXABAN 2.5 MG PO TABS: 2.5000 mg | ORAL_TABLET | Freq: Two times a day (BID) | ORAL | Status: DC

## 2019-11-22 MED ORDER — PANTOPRAZOLE SODIUM 40 MG PO TBEC
40.0000 mg | DELAYED_RELEASE_TABLET | Freq: Every day | ORAL | 1 refills | Status: DC
Start: 2019-11-23 — End: 2020-01-02

## 2019-11-22 MED ORDER — HYDROXYZINE HCL 10 MG PO TABS: 10.0000 mg | ORAL_TABLET | Freq: Three times a day (TID) | ORAL | 0 refills | Status: DC | PRN

## 2019-11-22 MED ORDER — POLYETHYLENE GLYCOL 3350 17 G PO PACK: 17.0000 g | PACK | Freq: Every day | ORAL | 0 refills | Status: AC

## 2019-11-22 MED ORDER — DOCUSATE SODIUM 100 MG PO CAPS: 100.0000 mg | ORAL_CAPSULE | Freq: Two times a day (BID) | ORAL | 0 refills | Status: AC

## 2019-11-22 MED ORDER — POLYSACCHARIDE IRON COMPLEX 150 MG PO CAPS: 150.0000 mg | ORAL_CAPSULE | Freq: Every day | ORAL | Status: DC

## 2019-11-22 MED ORDER — VITAMIN B-12 1000 MCG PO TABS: 1000.0000 ug | ORAL_TABLET | Freq: Every day | ORAL | Status: AC

## 2019-11-22 MED ORDER — METOPROLOL TARTRATE 25 MG PO TABS
25.0000 mg | ORAL_TABLET | Freq: Two times a day (BID) | ORAL | 1 refills | Status: DC
Start: 2019-11-22 — End: 2020-01-02

## 2019-11-22 MED ORDER — MELATONIN 3 MG PO TABS: 3.0000 mg | ORAL_TABLET | Freq: Every evening | ORAL | 0 refills | Status: AC | PRN

## 2019-11-22 NOTE — Discharge Summary (Signed)
Physician Discharge Summary  [name] [name] QIH:[id number] DOB: [birth year] DOA: 11/14/2019  PCP: Lajean Manes, MD  Admit date: 11/14/2019 Discharge date: 11/22/2019  Time spent: 55 minutes  Recommendations for Outpatient Follow-up:  1. Follow-up with MD at skilled nursing facility.  Patient will need a basic metabolic profile, magnesium, CBC done in 1 week to follow-up on electrolytes, renal function and H&H.  Patient will need speech therapy to follow-up at SNF. 2. Follow-up with Dr. Marlou Porch, cardiology on 12/12/2019.  On follow-up A. fib versus SVT versus paroxysmal atrial fibrillation need to be reassessed.  Anticoagulation also need to be reassessed as orthopedics had recommended Eliquis on discharge for DVT prophylaxis. 3. Follow-up with Dr. Lyla Glassing, orthopedics in 2 weeks.   Discharge Diagnoses:  Principal Problem:   Closed left hip fracture, initial encounter ([place]) Active Problems:   Hypokalemia   Pre-op evaluation   Prolonged QT interval   Noninfected skin tear of lower extremity, left, initial encounter   Goals of care, counseling/discussion   DNR (do not resuscitate)   Palliative care by specialist   Postoperative anemia due to acute blood loss   Essential hypertension   Fall at home   Anemia due to vitamin B12 deficiency   Dysphagia   New onset atrial fibrillation [facility])   Discharge Condition: Stable and improved  Diet recommendation: Dysphagia 3 diet  Filed Weights   11/14/19 0645  Weight: 45 kg    History of present illness:  HPI per Dr. Aileen Fass [name] [name] is a 86 y.o. female past medical history of right hip fracture status post repair more than 10 years ago, hypertension hyperlipidemia lives at her house with assistance she relates she thinks she tripped and fell and landed on her left hip after that she started having severe left-sided pain worse with movement.  She denies any prodromal symptoms.  Has any chest pain shortness of breath nausea vomiting,  diarrhea or fever.  In the ED: Found tachycardic normotensive satting 100% on room air, the medic with a white count of 10.7 with a left shift a left hip displaced fracture.  Hospital Course:  1 left hip fracture status post left hip hemiarthroplasty 11/14/2019 per Dr. Lyla Glassing Secondary to mechanical fall.  Patient seen in consultation by orthopedics.  Patient subsequently underwent left hip hemiarthroplasty 11/14/2019 without any complications.  Patient initially placed on scheduled Tylenol, Norco as well as IV morphine as needed for severe pain.  Patient seen by PT OT who recommended SNF placement.  Orthopedics recommended Eliquis on discharge for DVT prophylaxis.  Outpatient follow-up with orthopedics.    2.  New onset A. Fib/?  Paroxysmal atrial tachycardia versus SVT Patient noted to go into atrial fibrillation early on during the hospitalization.  Patient with no prior history of atrial fibrillation.  Cardiac enzymes obtained negative.  2D echo ordered and EF of 60 to 65%, no wall motion abnormalities, grade 1 diastolic dysfunction, mild LVH, mild MVR, left atrial size is normal..  Patient seen in consultation by cardiology and patient started on low-dose beta-blocker to prevent patient from going back into A. fib with RVR.  Patient noted to have some bursts of SVT and what seems like paroxysmal atrial tachycardia per cardiology and as such beta-blocker dose increased.  Patient noted to have a 20 beat run of SVT the evening of 11/19/2018.  Lopressor dose given early.  No further runs of SVT noted.  Cardiology not recommending anticoagulation at this time given short nature of patient's A. fib  and inciting event of hip fracture.  Patient was on aspirin for DVT prophylaxis per orthopedics for hip surgery.  Orthopedics recommended Eliquis on discharge for DVT prophylaxis.  Outpatient follow-up with cardiology.  3.  Hypertension Patient noted to be soft early on during the hospitalization.  Norvasc  discontinued.  Patient started on beta-blocker secondary to A. fib/paroxysmal atrial tachycardia.    Patient will be discharged on beta-blocker.  Outpatient follow-up.    4.  QTc prolongation Repeat EKG with resolution of QTC prolongation.  5.  Hypokalemia Repleted.  Potassium at 4.7 by day of discharge.  Patient noted to have some runs of SVT on 11/19/2019.  Magnesium was kept at 2.  Patient with new onset A. fib versus paroxysmal atrial tachycardia in the/potassium was kept around 4 and magnesium at 2..  Patient followed by cardiology.  6.  Dehydration Hydrated with IV fluids.    Patient was euvolemic by day of discharge.   7.  Reactive leukocytosis Resolved.  8.  Postop acute blood loss anemia/anemia of chronic disease/vitamin B12 deficiency. Patient with no overt bleeding.  Hemoglobin stabilized at 9.9.  Anemia panel with iron of 14, TIBC of 228, ferritin of 187, folate of 8.9, vitamin B12 of 159.    Patient was started on vitamin B12 1000 MCG subcutaneous daily during the hospitalization will be discharged on oral vitamin B12 supplementation.  Patient status post IV Feraheme during the hospitalization.  Patient will be discharged on oral iron supplementation.  Outpatient follow-up.  Marland Kitchen  9.  Dysphagia secondary to tortuous esophagus Patient with complaints of difficulty swallowing feels like food may be getting stuck however patient also with significant burping.  Improving clinically.  Patient underwent a barium esophagram that showed an extremely tortuous distal esophagus presumably accounting for slow passage of food in the esophagus, no high-grade obstruction, moderate hiatal hernia, findings similar but mildly progressed from esophagram in 2016.  Diet modifications made per speech therapy.   Patient was discharged on a dysphagia 3 diet.  Patient will need speech therapy to follow her her at skilled nursing facility.  Outpatient follow-up.    Procedures:  Plain films of the left  shoulder 11/14/2019  Plain films of the pelvis 11/14/2019  Chest x-ray 11/14/2019  Plain films of the left hip and pelvis 11/14/2019  Plain films of the right tib-fib 11/14/2019  Plain films of the pelvis 11/14/2019  Left hip hemiarthroplasty per Dr. Lyla Glassing 11/14/2019  2D echo 11/17/2019  Barium esophagram 11/18/2019  Consultations:  Orthopedics: Dr. Lyla Glassing 11/14/2019  Palliative care: Vinie Sill, NP [id number]/7846  Cardiology: Dr. Marlou Porch 11/17/2019  Discharge Exam: Vitals:   11/22/19 0557 11/22/19 1333  BP: (!) 155/78 (!) 99/56  Pulse: 76 (!) 58  Resp: 18 14  Temp: 98.3 F (36.8 C) 98.9 F (37.2 C)  SpO2: 96% 99%    General: NAD Cardiovascular: RRR Respiratory: CTAB  Discharge Instructions   Discharge Instructions    Diet general   Complete by: As directed    Dysphagia 3 diet   Discharge wound care:   Complete by: As directed    As above   Increase activity slowly   Complete by: As directed      Allergies as of 11/22/2019      Reactions   Codeine Nausea And Vomiting   Crestor [rosuvastatin Calcium]    myalgias   Escitalopram Oxalate    Abdominal problem   Ezetimibe-simvastatin    myalgias      Medication List  STOP taking these medications   amLODipine 2.5 MG tablet Commonly known as: NORVASC     TAKE these medications   acetaminophen 500 MG tablet Commonly known as: TYLENOL Take 500 mg by mouth every 6 (six) hours as needed for moderate pain.   apixaban 2.5 MG Tabs tablet Commonly known as: ELIQUIS Take 1 tablet (2.5 mg total) by mouth 2 (two) times daily.   docusate sodium 100 MG capsule Commonly known as: COLACE Take 1 capsule (100 mg total) by mouth 2 (two) times daily.   HYDROcodone-acetaminophen 5-325 MG tablet Commonly known as: NORCO/VICODIN Take 1 tablet by mouth every 4 (four) hours as needed for up to 7 days for moderate pain (pain score 4-6). What changed:   how much to take  when to take this  reasons to take this    hydrOXYzine 10 MG tablet Commonly known as: ATARAX/VISTARIL Take 1 tablet (10 mg total) by mouth 3 (three) times daily as needed for itching, anxiety or nausea.   iron polysaccharides 150 MG capsule Commonly known as: Nu-Iron Take 1 capsule (150 mg total) by mouth daily.   melatonin 3 MG Tabs tablet Take 1 tablet (3 mg total) by mouth at bedtime as needed (INSOMNIA).   metoprolol tartrate 25 MG tablet Commonly known as: LOPRESSOR Take 1 tablet (25 mg total) by mouth 2 (two) times daily.   ondansetron 4 MG tablet Commonly known as: ZOFRAN Take 4 mg by mouth 2 (two) times daily as needed for nausea/vomiting.   pantoprazole 40 MG tablet Commonly known as: PROTONIX Take 1 tablet (40 mg total) by mouth daily at 6 (six) AM. Start taking on: November 23, 2019   polyethylene glycol 17 g packet Commonly known as: MIRALAX / GLYCOLAX Take 17 g by mouth daily. Start taking on: November 23, 2019   vitamin B-12 1000 MCG tablet Commonly known as: CYANOCOBALAMIN Take 1 tablet (1,000 mcg total) by mouth daily.            Discharge Care Instructions  (From admission, onward)         Start     Ordered   11/22/19 0000  Discharge wound care:       Comments: As above   11/22/19 1404         Allergies  Allergen Reactions  . Codeine Nausea And Vomiting  . Crestor [Rosuvastatin Calcium]     myalgias  . Escitalopram Oxalate     Abdominal problem  . Ezetimibe-Simvastatin     myalgias    Contact information for follow-up providers    Swinteck, Aaron Edelman, MD. Schedule an appointment as soon as possible for a visit in 2 weeks.   Specialty: Orthopedic Surgery Why: For wound re-check Contact information: [address] [place] [address] [phone]        Jerline Pain, MD Follow up on 12/12/2019.   Specialty: Cardiology Why: at 9:15 AM with his NP Truitt Merle  Contact information: [address] [address] [phone]             Contact information for after-discharge care    Destination    [facility] Preferred SNF .   Service: Skilled Nursing Contact information: [address] [place] [phone]                   The results of significant diagnostics from this hospitalization (including imaging, microbiology, ancillary and laboratory) are listed below  for reference.    Significant Diagnostic Studies: DG Chest 1 View  Result Date: 11/14/2019 CLINICAL DATA:  Fall.  Severe left hip pain. EXAM: CHEST  1 VIEW COMPARISON:  Chest x-ray dated January 26, 2010. FINDINGS: The heart size and mediastinal contours are within normal limits. Normal pulmonary vascularity. Insert elevation of the right hemidiaphragm. No acute osseous abnormality. 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 IMPRESSION: 1. No active disease. Electronically Signed   By: Titus Dubin M.D.   On: 11/14/2019 08:07   DG Tibia/Fibula Right  Result Date: 11/14/2019 CLINICAL DATA:  Suspected hip fracture.  RIGHT lower leg pain. EXAM: RIGHT TIBIA AND FIBULA - 2 VIEW COMPARISON:  None FINDINGS: Osteopenia. Degenerative changes about the RIGHT knee incidentally noted, incompletely imaged. No signs of acute fracture or dislocation. IMPRESSION: 1. No signs of acute fracture or dislocation. 2. Osteopenia and degenerative changes. Electronically Signed   By: Zetta Bills M.D.   On: 11/14/2019 08:01   Pelvis Portable  Result Date: 11/14/2019 CLINICAL DATA:  Status post left hip arthroplasty. EXAM: PORTABLE PELVIS 1-2 VIEWS COMPARISON:  Left hip radiographs and intraoperative fluoroscopic images from 11/14/2019 FINDINGS: Sequelae of left hip hemiarthroplasty are again identified with postoperative gas in the surrounding soft tissues and skin staples in place. The prosthetic femoral head is approximated with the native acetabulum on this single image. No acute  fracture is identified. Remote right femoral ORIF is again noted. IMPRESSION: Left hip hemiarthroplasty as above. Electronically Signed   By: Logan Bores M.D.   On: 11/14/2019 14:33   DG Shoulder Left Port  Result Date: 11/14/2019 CLINICAL DATA:  Left shoulder pain after fall. EXAM: LEFT SHOULDER COMPARISON:  None. FINDINGS: No acute fracture or dislocation. Old healed left fifth and sixth rib fractures. Mild acromioclavicular degenerative changes with marginal spurring. Osteopenia. Soft tissues are unremarkable. IMPRESSION: 1. No acute osseous abnormality. Electronically Signed   By: Titus Dubin M.D.   On: 11/14/2019 14:31   DG C-Arm 1-60 Min-No Report  Result Date: 11/14/2019 Fluoroscopy was utilized by the requesting physician.  No radiographic interpretation.   ECHOCARDIOGRAM COMPLETE  Result Date: 11/17/2019    ECHOCARDIOGRAM REPORT   Patient Name:   [name] [name] Date of Exam: 11/17/2019 Medical Rec #:  [id number]    Height:       60.0 in Accession #:    [id number]   Weight:       99.2 lb Date of Birth:  09-27-1922    BSA:          1.386 m Patient Age:    86 years     BP:           147/73 mmHg Patient Gender: F            HR:           89 bpm. Exam Location:  Inpatient Procedure: 2D Echo, Cardiac Doppler and Color Doppler Indications:    I48.0 Paroxysmal atrial fibrillation  History:        Patient has no prior history of Echocardiogram examinations.                 Arrythmias:RBBB; Risk Factors:Hypertension and Dyslipidemia.  Sonographer:    Jonelle Sidle Dance Referring Phys: [place]  1. Left ventricular ejection fraction, by estimation, is 60 to 65%. The left ventricle has normal function. The left ventricle has no regional wall motion abnormalities. There is mild left ventricular hypertrophy.  Left ventricular diastolic parameters are consistent with Grade I diastolic dysfunction (impaired relaxation).  2. Right ventricular systolic function is normal. The right  ventricular size is normal. There is normal pulmonary artery systolic pressure.  3. The mitral valve is abnormal. Mild mitral valve regurgitation.  4. The aortic valve is abnormal. Aortic valve regurgitation is not visualized. Mild aortic valve sclerosis is present, with no evidence of aortic valve stenosis.  5. The inferior vena cava is normal in size with greater than 50% respiratory variability, suggesting right atrial pressure of 3 mmHg. FINDINGS  Left Ventricle: Left ventricular ejection fraction, by estimation, is 60 to 65%. The left ventricle has normal function. The left ventricle has no regional wall motion abnormalities. The left ventricular internal cavity size was normal in size. There is  mild left ventricular hypertrophy. Left ventricular diastolic parameters are consistent with Grade I diastolic dysfunction (impaired relaxation). Right Ventricle: The right ventricular size is normal. No increase in right ventricular wall thickness. Right ventricular systolic function is normal. There is normal pulmonary artery systolic pressure. The tricuspid regurgitant velocity is 2.65 m/s, and  with an assumed right atrial pressure of 3 mmHg, the estimated right ventricular systolic pressure is 54.6 mmHg. Left Atrium: Left atrial size was normal in size. Right Atrium: Right atrial size was normal in size. Pericardium: There is no evidence of pericardial effusion. Mitral Valve: The mitral valve is abnormal. Mild mitral annular calcification. Mild mitral valve regurgitation. Tricuspid Valve: The tricuspid valve is grossly normal. Tricuspid valve regurgitation is mild. Aortic Valve: The aortic valve is abnormal. Aortic valve regurgitation is not visualized. Mild aortic valve sclerosis is present, with no evidence of aortic valve stenosis. Pulmonic Valve: The pulmonic valve was not well visualized. Pulmonic valve regurgitation is not visualized. Aorta: The aortic root and ascending aorta are structurally normal, with  no evidence of dilitation. Venous: The inferior vena cava is normal in size with greater than 50% respiratory variability, suggesting right atrial pressure of 3 mmHg. IAS/Shunts: The interatrial septum was not assessed.  LEFT VENTRICLE PLAX 2D LVIDd:         3.90 cm LVIDs:         2.60 cm LV PW:         0.90 cm LV IVS:        1.40 cm LVOT diam:     1.80 cm LV SV:         49 LV SV Index:   35 LVOT Area:     2.54 cm  RIGHT VENTRICLE          IVC RV Basal diam:  2.60 cm  IVC diam: 1.40 cm TAPSE (M-mode): 1.8 cm LEFT ATRIUM             Index       RIGHT ATRIUM          Index LA diam:        4.00 cm 2.89 cm/m  RA Area:     7.78 cm LA Vol (A2C):   49.5 ml 35.72 ml/m RA Volume:   11.70 ml 8.44 ml/m LA Vol (A4C):   59.3 ml 42.80 ml/m LA Biplane Vol: 53.9 ml 38.90 ml/m  AORTIC VALVE LVOT Vmax:   89.65 cm/s LVOT Vmean:  59.200 cm/s LVOT VTI:    0.192 m  AORTA Ao Root diam: 3.10 cm Ao Asc diam:  2.90 cm MITRAL VALVE                TRICUSPID  VALVE MV Area (PHT): 3.65 cm     TR Peak grad:   28.1 mmHg MV Decel Time: 208 msec     TR Vmax:        265.00 cm/s MV E velocity: 100.00 cm/s MV A velocity: 98.10 cm/s   SHUNTS MV E/A ratio:  1.02         Systemic VTI:  0.19 m                             Systemic Diam: 1.80 cm Dorris Carnes MD Electronically signed by Dorris Carnes MD Signature Date/Time: 11/17/2019/4:44:49 PM    Final    DG HIP OPERATIVE UNILAT W OR W/O PELVIS LEFT  Result Date: 11/14/2019 CLINICAL DATA:  Elective surgery EXAM: OPERATIVE LEFT HIP (WITH PELVIS IF PERFORMED) 2 VIEWS TECHNIQUE: Fluoroscopic spot image(s) were submitted for interpretation post-operatively. COMPARISON:  11/14/2019 FLUOROSCOPY TIME 0 minutes 10 seconds Dose: 0.8511 mGy Images: 2 FINDINGS: Osseous demineralization. LEFT hip prosthesis. No acute fracture, dislocation, or bone destruction. IMPRESSION: LEFT hip prosthesis without acute complication. Electronically Signed   By: Lavonia Dana M.D.   On: 11/14/2019 13:25   DG Hip Unilat W or Wo  Pelvis 2-3 Views Left  Result Date: 11/14/2019 CLINICAL DATA:  Suspected hip fracture patient from home with un witnessed fall complaining of severe LEFT hip pain EXAM: DG HIP (WITH OR WITHOUT PELVIS) 2-3V LEFT COMPARISON:  May 13, 2018 FINDINGS: Signs of displaced and angulated fracture with varus deformity of the LEFT femoral neck. Superior migration of the LEFT femur. Femoral head remains within the acetabulum. Evidence of prior RIGHT femoral ORIF. Some anterior displacement of distal femur relative to femoral head on the cross-table lateral. Evidence of fracture, previous fracture of the RIGHT inferior pubic ramus. IMPRESSION: 1. Signs of displaced and angulated fracture of the LEFT femoral neck. 2. Prior RIGHT femoral ORIF. 3. Signs of prior fracture of the RIGHT inferior pubic ramus. Electronically Signed   By: Zetta Bills M.D.   On: 11/14/2019 07:52   DG ESOPHAGUS W SINGLE CM (SOL OR THIN BA)  Result Date: 11/18/2019 CLINICAL DATA:  Feeling of food sticking in esophagus. EXAM: ESOPHOGRAM/BARIUM SWALLOW TECHNIQUE: Single contrast examination was performed using  thin barium. FLUOROSCOPY TIME:  Fluoroscopy Time:  1 minutes 12 seconds Radiation Exposure Index (if provided by the fluoroscopic device): 12 mGy Number of Acquired Spot Images: 7 COMPARISON:  Esophagram 07/10/2014 FINDINGS: Exam is limited due to patient's recent open reduction internal fixation a hip fracture. Oral contrast flowed readily through the esophagus into the stomach. The distal esophagus is extremely tortuous. This finding is similar to but slightly progressed from comparison exam 2016. There is a moderate size hiatal hernia. No high-grade obstruction.  No reflux demonstrated. IMPRESSION: 1. Extremely tortuous distal esophagus presumably accounts for feeling of slow passage of food in the esophagus. Recommend small portion instructions. 2. No high-grade obstruction. 3. Moderate hiatal hernia. 4. Findings similar to but mildly  progressed from esophagram of 2016. Electronically Signed   By: Suzy Bouchard M.D.   On: 11/18/2019 14:50    Microbiology: Recent Results (from the past 240 hour(s))  SARS Coronavirus 2 by RT PCR (hospital order, performed in [facility] hospital lab) Nasopharyngeal Nasopharyngeal Swab     Status: None   Collection Time: 11/14/19  7:44 AM   Specimen: Nasopharyngeal Swab  Result Value Ref Range Status   SARS Coronavirus 2 NEGATIVE NEGATIVE Final  Comment: (NOTE) SARS-CoV-2 target nucleic acids are NOT DETECTED.  The SARS-CoV-2 RNA is generally detectable in upper and lower respiratory specimens during the acute phase of infection. The lowest concentration of SARS-CoV-2 viral copies this assay can detect is 250 copies / mL. A negative result does not preclude SARS-CoV-2 infection and should not be used as the sole basis for treatment or other patient management decisions.  A negative result may occur with improper specimen collection / handling, submission of specimen other than nasopharyngeal swab, presence of viral mutation(s) within the areas targeted by this assay, and inadequate number of viral copies (<250 copies / mL). A negative result must be combined with clinical observations, patient history, and epidemiological information.  Fact Sheet for Patients:   StrictlyIdeas.no  Fact Sheet for Healthcare Providers: BankingDealers.co.za  This test is not yet approved or  cleared by the [place] FDA and has been authorized for detection and/or diagnosis of SARS-CoV-2 by FDA under an Emergency Use Authorization (EUA).  This EUA will remain in effect (meaning this test can be used) for the duration of the COVID-19 declaration under Section 564(b)(1) of the Act, 21 U.S.C. section 360bbb-3(b)(1), unless the authorization is terminated or revoked sooner.  Performed at [facility], [place] [address], [place], [place] [address]   Surgical pcr screen     Status: None   Collection Time: 11/14/19 10:32 AM   Specimen: Nasal Mucosa; Nasal Swab  Result Value Ref Range Status   MRSA, PCR NEGATIVE NEGATIVE Final   Staphylococcus aureus NEGATIVE NEGATIVE Final    Comment: (NOTE) The Xpert SA Assay (FDA approved for NASAL specimens in patients 53 years of age and older), is one component of a comprehensive surveillance program. It is not intended to diagnose infection nor to guide or monitor treatment. Performed at [facility] [place] [address], [place], [place] [address]   SARS Coronavirus 2 by RT PCR (hospital order, performed in [facility] hospital lab) Nasopharyngeal Nasopharyngeal Swab     Status: None   Collection Time: 11/21/19  1:10 PM   Specimen: Nasopharyngeal Swab  Result Value Ref Range Status   SARS Coronavirus 2 NEGATIVE NEGATIVE Final    Comment: (NOTE) SARS-CoV-2 target nucleic acids are NOT DETECTED.  The SARS-CoV-2 RNA is generally detectable in upper and lower respiratory specimens during the acute phase of infection. The lowest concentration of SARS-CoV-2 viral copies this assay can detect is 250 copies / mL. A negative result does not preclude SARS-CoV-2 infection and should not be used as the sole basis for treatment or other patient management decisions.  A negative result may occur with improper specimen collection / handling, submission of specimen other than nasopharyngeal swab, presence of viral mutation(s) within the areas targeted by this assay, and inadequate number of viral copies (<250 copies / mL). A negative result must be combined with clinical observations, patient history, and epidemiological information.  Fact Sheet for Patients:   StrictlyIdeas.no  Fact Sheet for Healthcare Providers: BankingDealers.co.za  This test is not yet approved or  cleared by the [place] FDA  and has been authorized for detection and/or diagnosis of SARS-CoV-2 by FDA under an Emergency Use Authorization (EUA).  This EUA will remain in effect (meaning this test can be used) for the duration of the COVID-19 declaration under Section 564(b)(1) of the Act, 21 U.S.C. section 360bbb-3(b)(1), unless the authorization is terminated or revoked sooner.  Performed at [facility], [place] [address] [place],  [place] [address]      Labs: Basic Metabolic Panel: Recent Labs  Lab 11/17/19 0302 11/17/19 0819 11/18/19 0516 11/19/19 0602 11/20/19 0518 11/21/19 0600 11/22/19 0554  NA   < >  --  138 140 137 142 139  K   < >  --  3.5 4.4 3.5 4.1 4.7  CL   < >  --  107 105 106 105 102  CO2   < >  --  21* 22 22 25 26   GLUCOSE   < >  --  91 92 98 97 106*  BUN   < >  --  12 11 11 10 11   CREATININE   < >  --  0.42* 0.51 0.52 0.54 0.65  CALCIUM   < >  --  8.7* 9.3 8.8* 9.5 9.7  MG  --  2.2 2.0  --   --   --  2.2   < > = values in this interval not displayed.   Liver Function Tests: No results for input(s): AST, ALT, ALKPHOS, BILITOT, PROT, ALBUMIN in the last 168 hours. No results for input(s): LIPASE, AMYLASE in the last 168 hours. No results for input(s): AMMONIA in the last 168 hours. CBC: Recent Labs  Lab 11/16/19 0304 11/16/19 0304 11/17/19 0302 11/18/19 0516 11/19/19 0602 11/20/19 0518 11/21/19 0600  WBC 9.2  --  9.1 7.9 9.0  --   --   HGB 10.4*   < > 9.7* 9.0* 10.5* 9.0* 9.9*  HCT 33.7*   < > 30.5* 28.2* 33.1* 27.9* 32.5*  MCV 94.4  --  92.1 91.3 92.7  --   --   PLT 226  --  250 298 373  --   --    < > = values in this interval not displayed.   Cardiac Enzymes: No results for input(s): CKTOTAL, CKMB, CKMBINDEX, TROPONINI in the last 168 hours. BNP: BNP (last 3 results) No results for input(s): BNP in the last 8760 hours.  ProBNP (last 3 results) No results for input(s): PROBNP in the last 8760 hours.  CBG: No results for input(s): GLUCAP in  the last 168 hours.     Signed:  Irine Seal MD.  [facility] 11/22/2019, 2:09 PM

## 2019-11-22 NOTE — TOC Transition Note (Addendum)
Transition of Care [facility]) - CM/SW Discharge Note   Patient Details  Name: [name] [name] MRN: [id number] Date of Birth: 12-Jul-1922  Transition of [facility]) CM/SW Contact:  Dessa Phi, RN Phone Number: 11/22/2019, 2:21 PM   Clinical Narrative: d/c summary faxed to [facility]. Going to rm#[phone], nsg call report tel#[phone]. [facility] for transport for 3:30p pick up.No further CM needs.    Final next level of care: Skilled Nursing Facility Barriers to Discharge: No Barriers Identified   Patient Goals and CMS Choice Patient states their goals for this hospitalization and ongoing recovery are:: to go to rehab CMS Medicare.gov Compare Post Acute Care list provided to:: Patient Choice offered to / list presented to : Patient  Discharge Placement PASRR number recieved: 11/21/19            Patient chooses bed at: [facility] Patient to be transferred to facility by: [place] Name of family member notified: Opal Sidles dtr [phone] Patient and family notified of of transfer: 11/22/19  Discharge Plan and Services   Discharge Planning Services: CM Consult Post Acute Care Choice: [place]          DME Arranged: N/A DME Agency: NA       HH Arranged: NA HH Agency: NA        Social Determinants of Health (SDOH) Interventions     Readmission Risk Interventions No flowsheet data found.

## 2019-11-22 NOTE — Evaluation (Signed)
Clinical/Bedside Swallow Evaluation Patient Details  Name: [name] [name] MRN: [id number] Date of Birth: December 06, 1922  Today's Date: 11/22/2019 Time: SLP Start Time (ACUTE ONLY): 1250 SLP Stop Time (ACUTE ONLY): 1317 SLP Time Calculation (min) (ACUTE ONLY): 27 min  Past Medical History:  Past Medical History:  Diagnosis Date  . Arthritis   . Hip fracture [facility]) 2009   surgery  . History of depression   . History of gallstones   . History of IBS   . History of vertigo   . Hyperlipidemia   . Hypertension   . RBBB (right bundle branch block)    Past Surgical History:  Past Surgical History:  Procedure Laterality Date  . CHOLECYSTECTOMY  2008   Dr. Harlow Asa  . [place]   hysterectomy  . TONSILECTOMY, ADENOIDECTOMY, BILATERAL MYRINGOTOMY AND TUBES     age 16 tonsils and adnoids only  . TOTAL HIP ARTHROPLASTY  02/20/2008   fractured hip  . TOTAL HIP ARTHROPLASTY Left 11/14/2019   Procedure: LEFT HIP HEMIARTHROPLASTY ANTERIOR APPROACH;  Surgeon: Rod Can, MD;  Location: [facility];  Service: Orthopedics;  Laterality: Left;   HPI: 86yo female admitted 11/14/19 with fall at left hip pain. PMH: Right hip fx, HTN, HLD, depression, gall stones, IBS, vertigo, RBBB, HOH. Lungs CTA, CXR negative. Pt reports globus sensation, and exhibits frequent belching during meal that has gone on for several years - worsening in the last six months.      Assessment / Plan / Recommendation Clinical Impression  Pt reports h/o chronic problems swallowing that has worsened in the last six months and now exacerbated by her current medical illness.    SLP reviewed esophageal dysmotility compensation strategies with pt using teach back - pt reported "I do that" to nearly every strategy reviewed.  She was observed taking a pill with water/miralax and next with applesauce - followed by more applesauce and then liquids.  Audible swallow with belching observed with pt reporting sensation of pill  lodging in pharynx.  More liquid or applesauce consumption helpful to clear sensation of stasis and no indication of aspiration with po.     RN present states yesterday pt coughed and expectorated secretions after eating toast with food consumed within the secretions.  Pt admits she has issues with some solid food prior to admission= and states toast is one of those problem foods.    She does admit to decreased intake due to her dysphagia, stating "It's not worth the trouble".  Pt does better with warm liquids per her statement- likely due to her dysmotility stating it helps her muscles to "relax".  She drinks coffee frequently.     Given liquid supplements are easier for pt to consume *but she dislikes Ensure* , recommend consider Boost Breeze.  Pt consumed several boluses of Boost Breeze and stated she didn't mind it. Encouraged pt to consume enough liquid supplement and water to allow adequate nutrition and hydration due to her needing to recover from hip fx and surgery.    Recommend dietician consult at pt's facility to help her maximize her nutrition.   Continue diet, start all intake with liquids, alternate solids and liquids, eat small frequent meals, medication with puree *start and follow with liquids.  Provided all information in writing for pt and reviewed orally using teach back.  Hopefully pt's swallow function will return to baseline with ongoing recovery from current medical issue. Advised pt to keep her expectoration and coughing ability strong for airway  protection.   Thanks.  SLP Visit Diagnosis: Dysphagia, unspecified (R13.10)    Aspiration Risk  Mild aspiration risk;Risk for inadequate nutrition/hydration    Diet Recommendation Dysphagia 3 (Mech soft);Thin liquid   Liquid Administration via: Straw;Cup Medication Administration: Whole meds with liquid (with puree - start and follow with liquids) Supervision: Patient able to self feed;Intermittent supervision to cue for  compensatory strategies Compensations: Minimize environmental distractions;Small sips/bites;Slow rate;Other (Comment) (warm liquids, small frequent meals, reflux precautions) Postural Changes: Seated upright at 90 degrees;Remain upright for at least 30 minutes after po intake    Other  Recommendations Oral Care Recommendations: Oral care BID   Follow up Recommendations Skilled Nursing facility      Frequency and Duration min 1 x/week  1 week       Prognosis Prognosis for Safe Diet Advancement: Fair Barriers to Reach Goals: Time post onset      Swallow Study   General      Oral/Motor/Sensory Function Overall Oral Motor/Sensory Function: Within functional limits   Ice Chips Ice chips: Not tested   Thin Liquid Presentation: Cup;Self Fed;Straw Other Comments: multiple swallows and belching    Nectar Thick Nectar Thick Liquid: Not tested   Honey Thick Honey Thick Liquid: Not tested   Puree Puree: Within functional limits Presentation: Self Fed;Spoon   Solid     Other Comments: pt observed taking her pills given by RN with thin and with applesauce, c/o sensation of pill lodging in proximal esophagus needing more liquids to clear      Macario Golds 11/22/2019,5:38 PM    Kathleen Lime, MS [place] [facility] [phone]

## 2019-11-22 NOTE — Progress Notes (Signed)
Repeat clinical swallow evaluation completed. Full report to follow.  Pt reports h/o chronic problems swallowing that has worsened in the last six months and now exacerbated by her current medical illness.  SLP reviewed esophageal dysmotility strategies with pt using teach back - pt reported "I do that" to nearly every strategy reviewed.  She was observed taking a pill with water/miralax and next with applesauce - followed by more applesauce and then liquids.  Audible swallow with belching observed with pt reporting sensation of pill lodging in pharynx.  More liquid or applesauce consumption helpful to clear sensation of stasis and no indication of aspiration with po.    RN present states yesterday pt coughed and expectorated secretions after eating toast with food consumed within the secretions.  Pt admits she has issues with some solid food prior to admission= and states toast is one of those problem foods.  She does admit to decreased intake due to her dysphagia, stating "It's not worth the trouble".  Pt does better with warm liquids per her statement- likely due to her dysmotility. She drinks coffee frequently.    Given liquid supplements are easier for pt to consume *but she dislikes Ensure* , recommend consider Boost Breeze.  Pt consumed several boluses of Boost Breeze and stated she didn't mind it. Encouraged pt to consume enough liquid supplement and water to allow adequate nutrition and hydration due to her needing to recover from hip fx and surgery.     Recommend dietician consult at pt's facility to help her maximize her nutrition. Continue diet, start all intake with liquids, alternate solids and liquids, eat small frequent meals, medication with puree *start and follow with liquids.  Provided all information in writing for pt and reviewed orally using teach back.  Hopefully pt's swallow function will return to baseline with ongoing recovery from current medical issue. Advised pt to keep her  expectoration and coughing ability strong for airway protection.   Thanks.    Kathleen Lime, MS [place] [facility] [phone]

## 2019-11-23 ENCOUNTER — Encounter: Payer: Self-pay | Admitting: Family

## 2019-11-23 ENCOUNTER — Non-Acute Institutional Stay (SKILLED_NURSING_FACILITY): Payer: Medicare Other | Admitting: Family

## 2019-11-23 ENCOUNTER — Other Ambulatory Visit: Payer: Self-pay | Admitting: Family

## 2019-11-23 DIAGNOSIS — R195 Other fecal abnormalities: Secondary | ICD-10-CM | POA: Diagnosis not present

## 2019-11-23 DIAGNOSIS — S81811D Laceration without foreign body, right lower leg, subsequent encounter: Secondary | ICD-10-CM

## 2019-11-23 DIAGNOSIS — M25552 Pain in left hip: Secondary | ICD-10-CM | POA: Diagnosis not present

## 2019-11-23 MED ORDER — HYDROCODONE-ACETAMINOPHEN 5-325 MG PO TABS
1.0000 | ORAL_TABLET | ORAL | 0 refills | Status: DC | PRN
Start: 2019-11-23 — End: 2019-12-08

## 2019-11-23 NOTE — Progress Notes (Signed)
Location:    [place].   Nursing Home Room Number: 109-P Place of Service:  SNF (31) Provider:  Marlowe Sax, NP    Patient Care Team: Lajean Manes, MD as PCP - General (Internal Medicine) Jerline Pain, MD as PCP - Cardiology (Cardiology)  Extended Emergency Contact Information Primary Emergency Contact: Justice,Jane Work Phone: [phone] Mobile Phone: [phone] Relation: Other Preferred language: English Interpreter needed? No Secondary Emergency Contact: [place] [employer] Phone: [phone] Relation: Relative  Code Status:  DNR Goals of care: Advanced Directive information Advanced Directives 11/23/2019  Does Patient Have a Medical Advance Directive? Yes  Type of Advance Directive Out of facility DNR (pink MOST or yellow form)  Does patient want to make changes to medical advance directive? No - Patient declined  Would patient like information on creating a medical advance directive? -     Chief Complaint  Patient presents with  . Acute Visit    left hip pain.    HPI:  Pt is a 86 y.o. female seen today for an acute visit for left hip pain.Facility Nurse states send for Norco 5/325 mg tablet one by mouth every 4 hrs as needed.Patient states left hip pain worst with movement.states pain medication reliefs pain. Facility Nurse states patient has had loose stool but has colace,Miralax scheduled daily.I'Ve discussed with Nurse to hold stool softeners if patient has loose stool.    Past Medical History:  Diagnosis Date  . Arthritis   . Hip fracture [facility]) 2009   surgery  . History of depression   . History of gallstones   . History of IBS   . History of vertigo   . Hyperlipidemia   . Hypertension   . RBBB (right bundle branch block)    Past Surgical History:  Procedure Laterality Date  . CHOLECYSTECTOMY  2008   Dr. Harlow Asa  . [place]   hysterectomy  . TONSILECTOMY, ADENOIDECTOMY,  BILATERAL MYRINGOTOMY AND TUBES     age 78 tonsils and adnoids only  . TOTAL HIP ARTHROPLASTY  02/20/2008   fractured hip  . TOTAL HIP ARTHROPLASTY Left 11/14/2019   Procedure: LEFT HIP HEMIARTHROPLASTY ANTERIOR APPROACH;  Surgeon: Rod Can, MD;  Location: [facility];  Service: Orthopedics;  Laterality: Left;    Allergies  Allergen Reactions  . Codeine Nausea And Vomiting  . Crestor [Rosuvastatin Calcium]     myalgias  . Escitalopram Oxalate     Abdominal problem  . Ezetimibe-Simvastatin     myalgias    Allergies as of 11/23/2019      Reactions   Codeine Nausea And Vomiting   Crestor [rosuvastatin Calcium]    myalgias   Escitalopram Oxalate    Abdominal problem   Ezetimibe-simvastatin    myalgias      Medication List       Accurate as of November 23, 2019  4:07 PM. If you have any questions, ask your nurse or doctor.        acetaminophen 500 MG tablet Commonly known as: TYLENOL Take 500 mg by mouth every 6 (six) hours as needed for moderate pain.   apixaban 2.5 MG Tabs tablet Commonly known as: ELIQUIS Take 1 tablet (2.5 mg total) by mouth 2 (two) times daily.   BISACODYL LAXATIVE RE Place 10 mg rectally as needed.   docusate sodium 100 MG capsule Commonly known as: COLACE Take 1 capsule (100 mg total) by mouth 2 (two) times daily.  HYDROcodone-acetaminophen 5-325 MG tablet Commonly known as: NORCO/VICODIN Take 1 tablet by mouth every 4 (four) hours as needed for moderate pain.   hydrOXYzine 10 MG tablet Commonly known as: ATARAX/VISTARIL Take 1 tablet (10 mg total) by mouth 3 (three) times daily as needed for itching, anxiety or nausea.   iron polysaccharides 150 MG capsule Commonly known as: Nu-Iron Take 1 capsule (150 mg total) by mouth daily.   melatonin 3 MG Tabs tablet Take 1 tablet (3 mg total) by mouth at bedtime as needed (INSOMNIA).   metoprolol tartrate 25 MG tablet Commonly known as: LOPRESSOR Take 1 tablet (25 mg total) by mouth 2  (two) times daily.   MILK OF MAGNESIA PO Take 30 mLs by mouth.   ondansetron 4 MG tablet Commonly known as: ZOFRAN Take 4 mg by mouth 2 (two) times daily as needed for nausea/vomiting.   pantoprazole 40 MG tablet Commonly known as: PROTONIX Take 1 tablet (40 mg total) by mouth daily at 6 (six) AM.   polyethylene glycol 17 g packet Commonly known as: MIRALAX / GLYCOLAX Take 17 g by mouth daily.   RA SALINE ENEMA RE Place rectally as needed.   vitamin B-12 1000 MCG tablet Commonly known as: CYANOCOBALAMIN Take 1 tablet (1,000 mcg total) by mouth daily.       Review of Systems  Constitutional: Negative for appetite change, chills, fatigue and fever.  Respiratory: Negative for cough, chest tightness, shortness of breath and wheezing.   Cardiovascular: Negative for chest pain, palpitations and leg swelling.  Gastrointestinal: Negative for abdominal distention, abdominal pain, constipation, diarrhea and vomiting.       Loose on stool softeners   Musculoskeletal: Positive for arthralgias and gait problem.       Left hip pain status post hip replacement  Skin: Negative for color change, pallor and rash.       Left hip surgical incision   Neurological: Negative for dizziness, speech difficulty, weakness, light-headedness, numbness and headaches.  Psychiatric/Behavioral: Negative for agitation, confusion and sleep disturbance. The patient is not nervous/anxious.     Immunization History  Administered Date(s) Administered  . Influenza, High Dose Seasonal PF 01/05/2018, 12/23/2018  . Tdap 11/14/2019  . Unspecified SARS-COV-2 Vaccination 04/01/2019, 05/02/2019   Pertinent  Health Maintenance Due  Topic Date Due  . DEXA SCAN  Never done  . PNA vac Low Risk Adult (1 of 2 - PCV13) Never done  . INFLUENZA VACCINE  10/30/2019   No flowsheet data found.     Vitals:   11/23/19 1432  BP: 123/70  Pulse: 70  Resp: 18  Temp: (!) 97.2 F (36.2 C)  Weight: 100 lb 12.8 oz (45.7  kg)  Height: 5' (1.524 m)   Body mass index is 19.69 kg/m. Physical Exam Vitals and nursing note reviewed.  Constitutional:      General: She is not in acute distress.    Appearance: She is normal weight. She is not ill-appearing.  HENT:     Head: Normocephalic.     Nose: Nose normal. No congestion or rhinorrhea.  Eyes:     General: No scleral icterus.       Right eye: No discharge.        Left eye: No discharge.     Extraocular Movements: Extraocular movements intact.     Conjunctiva/sclera: Conjunctivae normal.     Pupils: Pupils are equal, round, and reactive to light.  Cardiovascular:     Rate and Rhythm: Normal rate and regular rhythm.  Pulses: Normal pulses.     Heart sounds: Normal heart sounds.  Pulmonary:     Effort: Pulmonary effort is normal. No respiratory distress.     Breath sounds: Normal breath sounds. No wheezing, rhonchi or rales.  Chest:     Chest wall: No tenderness.  Abdominal:     General: Bowel sounds are normal. There is no distension.     Palpations: Abdomen is soft. There is no mass.     Tenderness: There is no abdominal tenderness. There is no right CVA tenderness, left CVA tenderness, guarding or rebound.  Musculoskeletal:        General: No swelling or tenderness.     Right lower leg: No edema.     Left lower leg: No edema.     Comments: Unsteady gait   Skin:    General: Skin is warm.     Coloration: Skin is not pale.     Findings: No erythema or rash.     Comments: - Right lateral leg skin tear without any signs of infection   -  Left hip surgical incision Aquacel dressing dry,clean and intact surrounding skin without any erythema.   Neurological:     Mental Status: She is alert and oriented to person, place, and time.     Cranial Nerves: No cranial nerve deficit.     Sensory: No sensory deficit.     Motor: No weakness.     Gait: Gait abnormal.  Psychiatric:        Mood and Affect: Mood normal.        Speech: Speech normal.         Behavior: Behavior normal.        Thought Content: Thought content normal.        Judgment: Judgment normal.     Labs reviewed: Recent Labs    11/17/19 0302 11/17/19 0819 11/18/19 0516 11/19/19 0602 11/20/19 0518 11/21/19 0600 11/22/19 0554  NA   < >  --  138   < > 137 142 139  K   < >  --  3.5   < > 3.5 4.1 4.7  CL   < >  --  107   < > 106 105 102  CO2   < >  --  21*   < > 22 25 26   GLUCOSE   < >  --  91   < > 98 97 106*  BUN   < >  --  12   < > 11 10 11   CREATININE   < >  --  0.42*   < > 0.52 0.54 0.65  CALCIUM   < >  --  8.7*   < > 8.8* 9.5 9.7  MG  --  2.2 2.0  --   --   --  2.2   < > = values in this interval not displayed.    Recent Labs    11/14/19 0655 11/15/19 0320 11/17/19 0302 11/17/19 0302 11/18/19 0516 11/18/19 0516 11/19/19 0602 11/20/19 0518 11/21/19 0600  WBC 10.7*   < > 9.1  --  7.9  --  9.0  --   --   NEUTROABS 9.1*  --   --   --   --   --   --   --   --   HGB 13.1   < > 9.7*   < > 9.0*   < > 10.5* 9.0* 9.9*  HCT 40.8   < > 30.5*   < >  28.2*   < > 33.1* 27.9* 32.5*  MCV 91.7   < > 92.1  --  91.3  --  92.7  --   --   PLT 272   < > 250  --  298  --  373  --   --    < > = values in this interval not displayed.   Lab Results  Component Value Date   TSH 1.810 11/17/2019   Lab Results  Component Value Date   HGBA1C (H) 01/27/2010    5.9 (NOTE)                                                                       According to the ADA Clinical Practice Recommendations for 2011, when HbA1c is used as a screening test:   >=6.5%   Diagnostic of Diabetes Mellitus           (if abnormal result  is confirmed)  5.7-6.4%   Increased risk of developing Diabetes Mellitus  References:Diagnosis and Classification of Diabetes Mellitus,Diabetes QAST,4196,22([facility] 1):S62-S69 and Standards of Medical Care in         Diabetes - 2011,Diabetes XQJJ,9417,40  (Suppl 1):S11-S61.   Lab Results  Component Value Date   CHOL  12/17/2006    127        ATP III  CLASSIFICATION:  <200     mg/dL   Desirable  200-239  mg/dL   Borderline High  >=240    mg/dL   High   HDL 47 12/17/2006   LDLCALC  12/17/2006    65        Total Cholesterol/HDL:CHD Risk Coronary Heart Disease Risk Table                     Men   Women  1/2 Average Risk   3.4   3.3   TRIG 75 12/17/2006   CHOLHDL 2.7 12/17/2006    Significant Diagnostic Results in last 30 days:  DG Chest 1 View  Result Date: 11/14/2019 CLINICAL DATA:  Fall.  Severe left hip pain. EXAM: CHEST  1 VIEW COMPARISON:  Chest x-ray dated January 26, 2010. FINDINGS: The heart size and mediastinal contours are within normal limits. Normal pulmonary vascularity. Insert elevation of the right hemidiaphragm. No acute osseous abnormality. 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 IMPRESSION: 1. No active disease. Electronically Signed   By: Titus Dubin M.D.   On: 11/14/2019 08:07   DG Tibia/Fibula Right  Result Date: 11/14/2019 CLINICAL DATA:  Suspected hip fracture.  RIGHT lower leg pain. EXAM: RIGHT TIBIA AND FIBULA - 2 VIEW COMPARISON:  None FINDINGS: Osteopenia. Degenerative changes about the RIGHT knee incidentally noted, incompletely imaged. No signs of acute fracture or dislocation. IMPRESSION: 1. No signs of acute fracture or dislocation. 2. Osteopenia and degenerative changes. Electronically Signed   By: Zetta Bills M.D.   On: 11/14/2019 08:01   Pelvis Portable  Result Date: 11/14/2019 CLINICAL DATA:  Status post left hip arthroplasty. EXAM: PORTABLE PELVIS 1-2 VIEWS COMPARISON:  Left hip radiographs and intraoperative fluoroscopic images from 11/14/2019 FINDINGS: Sequelae of left hip hemiarthroplasty are again identified with postoperative gas in the surrounding soft tissues and skin staples  in place. The prosthetic femoral head is approximated with the native acetabulum on this single image. No acute fracture is identified. Remote right femoral ORIF is again noted.  IMPRESSION: Left hip hemiarthroplasty as above. Electronically Signed   By: Logan Bores M.D.   On: 11/14/2019 14:33   DG Shoulder Left Port  Result Date: 11/14/2019 CLINICAL DATA:  Left shoulder pain after fall. EXAM: LEFT SHOULDER COMPARISON:  None. FINDINGS: No acute fracture or dislocation. Old healed left fifth and sixth rib fractures. Mild acromioclavicular degenerative changes with marginal spurring. Osteopenia. Soft tissues are unremarkable. IMPRESSION: 1. No acute osseous abnormality. Electronically Signed   By: Titus Dubin M.D.   On: 11/14/2019 14:31   DG C-Arm 1-60 Min-No Report  Result Date: 11/14/2019 Fluoroscopy was utilized by the requesting physician.  No radiographic interpretation.   ECHOCARDIOGRAM COMPLETE  Result Date: 11/17/2019    ECHOCARDIOGRAM REPORT   Patient Name:   [name] [name] Date of Exam: 11/17/2019 Medical Rec #:  [id number]    Height:       60.0 in Accession #:    [id number]   Weight:       99.2 lb Date of Birth:  05-13-1922    BSA:          1.386 m Patient Age:    89 years     BP:           147/73 mmHg Patient Gender: F            HR:           89 bpm. Exam Location:  Inpatient Procedure: 2D Echo, Cardiac Doppler and Color Doppler Indications:    I48.0 Paroxysmal atrial fibrillation  History:        Patient has no prior history of Echocardiogram examinations.                 Arrythmias:RBBB; Risk Factors:Hypertension and Dyslipidemia.  Sonographer:    Jonelle Sidle Dance Referring Phys: [place]  1. Left ventricular ejection fraction, by estimation, is 60 to 65%. The left ventricle has normal function. The left ventricle has no regional wall motion abnormalities. There is mild left ventricular hypertrophy. Left ventricular diastolic parameters are consistent with Grade I diastolic dysfunction (impaired relaxation).  2. Right ventricular systolic function is normal. The right ventricular size is normal. There is normal pulmonary artery systolic  pressure.  3. The mitral valve is abnormal. Mild mitral valve regurgitation.  4. The aortic valve is abnormal. Aortic valve regurgitation is not visualized. Mild aortic valve sclerosis is present, with no evidence of aortic valve stenosis.  5. The inferior vena cava is normal in size with greater than 50% respiratory variability, suggesting right atrial pressure of 3 mmHg. FINDINGS  Left Ventricle: Left ventricular ejection fraction, by estimation, is 60 to 65%. The left ventricle has normal function. The left ventricle has no regional wall motion abnormalities. The left ventricular internal cavity size was normal in size. There is  mild left ventricular hypertrophy. Left ventricular diastolic parameters are consistent with Grade I diastolic dysfunction (impaired relaxation). Right Ventricle: The right ventricular size is normal. No increase in right ventricular wall thickness. Right ventricular systolic function is normal. There is normal pulmonary artery systolic pressure. The tricuspid regurgitant velocity is 2.65 m/s, and  with an assumed right atrial pressure of 3 mmHg, the estimated right ventricular systolic pressure is 73.4 mmHg. Left Atrium: Left atrial size was normal in size. Right Atrium: Right atrial size  was normal in size. Pericardium: There is no evidence of pericardial effusion. Mitral Valve: The mitral valve is abnormal. Mild mitral annular calcification. Mild mitral valve regurgitation. Tricuspid Valve: The tricuspid valve is grossly normal. Tricuspid valve regurgitation is mild. Aortic Valve: The aortic valve is abnormal. Aortic valve regurgitation is not visualized. Mild aortic valve sclerosis is present, with no evidence of aortic valve stenosis. Pulmonic Valve: The pulmonic valve was not well visualized. Pulmonic valve regurgitation is not visualized. Aorta: The aortic root and ascending aorta are structurally normal, with no evidence of dilitation. Venous: The inferior vena cava is normal in  size with greater than 50% respiratory variability, suggesting right atrial pressure of 3 mmHg. IAS/Shunts: The interatrial septum was not assessed.  LEFT VENTRICLE PLAX 2D LVIDd:         3.90 cm LVIDs:         2.60 cm LV PW:         0.90 cm LV IVS:        1.40 cm LVOT diam:     1.80 cm LV SV:         49 LV SV Index:   35 LVOT Area:     2.54 cm  RIGHT VENTRICLE          IVC RV Basal diam:  2.60 cm  IVC diam: 1.40 cm TAPSE (M-mode): 1.8 cm LEFT ATRIUM             Index       RIGHT ATRIUM          Index LA diam:        4.00 cm 2.89 cm/m  RA Area:     7.78 cm LA Vol (A2C):   49.5 ml 35.72 ml/m RA Volume:   11.70 ml 8.44 ml/m LA Vol (A4C):   59.3 ml 42.80 ml/m LA Biplane Vol: 53.9 ml 38.90 ml/m  AORTIC VALVE LVOT Vmax:   89.65 cm/s LVOT Vmean:  59.200 cm/s LVOT VTI:    0.192 m  AORTA Ao Root diam: 3.10 cm Ao Asc diam:  2.90 cm MITRAL VALVE                TRICUSPID VALVE MV Area (PHT): 3.65 cm     TR Peak grad:   28.1 mmHg MV Decel Time: 208 msec     TR Vmax:        265.00 cm/s MV E velocity: 100.00 cm/s MV A velocity: 98.10 cm/s   SHUNTS MV E/A ratio:  1.02         Systemic VTI:  0.19 m                             Systemic Diam: 1.80 cm Dorris Carnes MD Electronically signed by Dorris Carnes MD Signature Date/Time: 11/17/2019/4:44:49 PM    Final    DG HIP OPERATIVE UNILAT W OR W/O PELVIS LEFT  Result Date: 11/14/2019 CLINICAL DATA:  Elective surgery EXAM: OPERATIVE LEFT HIP (WITH PELVIS IF PERFORMED) 2 VIEWS TECHNIQUE: Fluoroscopic spot image(s) were submitted for interpretation post-operatively. COMPARISON:  11/14/2019 FLUOROSCOPY TIME 0 minutes 10 seconds Dose: 0.8511 mGy Images: 2 FINDINGS: Osseous demineralization. LEFT hip prosthesis. No acute fracture, dislocation, or bone destruction. IMPRESSION: LEFT hip prosthesis without acute complication. Electronically Signed   By: Lavonia Dana M.D.   On: 11/14/2019 13:25   DG Hip Unilat W or Wo Pelvis 2-3 Views Left  Result Date: 11/14/2019 CLINICAL DATA:  Suspected hip fracture patient from home with un witnessed fall complaining of severe LEFT hip pain EXAM: DG HIP (WITH OR WITHOUT PELVIS) 2-3V LEFT COMPARISON:  May 13, 2018 FINDINGS: Signs of displaced and angulated fracture with varus deformity of the LEFT femoral neck. Superior migration of the LEFT femur. Femoral head remains within the acetabulum. Evidence of prior RIGHT femoral ORIF. Some anterior displacement of distal femur relative to femoral head on the cross-table lateral. Evidence of fracture, previous fracture of the RIGHT inferior pubic ramus. IMPRESSION: 1. Signs of displaced and angulated fracture of the LEFT femoral neck. 2. Prior RIGHT femoral ORIF. 3. Signs of prior fracture of the RIGHT inferior pubic ramus. Electronically Signed   By: Zetta Bills M.D.   On: 11/14/2019 07:52   DG ESOPHAGUS W SINGLE CM (SOL OR THIN BA)  Result Date: 11/18/2019 CLINICAL DATA:  Feeling of food sticking in esophagus. EXAM: ESOPHOGRAM/BARIUM SWALLOW TECHNIQUE: Single contrast examination was performed using  thin barium. FLUOROSCOPY TIME:  Fluoroscopy Time:  1 minutes 12 seconds Radiation Exposure Index (if provided by the fluoroscopic device): 12 mGy Number of Acquired Spot Images: 7 COMPARISON:  Esophagram 07/10/2014 FINDINGS: Exam is limited due to patient's recent open reduction internal fixation a hip fracture. Oral contrast flowed readily through the esophagus into the stomach. The distal esophagus is extremely tortuous. This finding is similar to but slightly progressed from comparison exam 2016. There is a moderate size hiatal hernia. No high-grade obstruction.  No reflux demonstrated. IMPRESSION: 1. Extremely tortuous distal esophagus presumably accounts for feeling of slow passage of food in the esophagus. Recommend small portion instructions. 2. No high-grade obstruction. 3. Moderate hiatal hernia. 4. Findings similar to but mildly progressed from esophagram of 2016. Electronically Signed   By:  Suzy Bouchard M.D.   On: 11/18/2019 14:50    Assessment/Plan 1. Left hip pain Status post left hip hemiarthroplasty 11/14/2019 done by Dr.Swinteck.surgical incision intact without any signs of infection.currently on Norco 5/325 mg tablet every 4 hrs as needed.Will schedule Norco 5/325 mg tablet every 8 hrs for better pain control.   2. Noninfected skin tear of right lower extremity, subsequent encounter - Right lateral leg skin tear without any signs of infection   -  Left hip surgical incision Aquacel dressing dry,clean and intact surrounding skin without any erythema.   3. Laceration of right lower extremity, subsequent encounter No sign infection.continue to monitor.  4. Loose stools Hold miralax and colace  Family/ staff Communication: Reviewed plan of care with patient and facility Nurse   Labs/tests ordered:  None

## 2019-11-25 ENCOUNTER — Non-Acute Institutional Stay (SKILLED_NURSING_FACILITY): Payer: Medicare Other | Admitting: Internal Medicine

## 2019-11-25 ENCOUNTER — Encounter: Payer: Self-pay | Admitting: Internal Medicine

## 2019-11-25 DIAGNOSIS — I10 Essential (primary) hypertension: Secondary | ICD-10-CM

## 2019-11-25 DIAGNOSIS — S72002A Fracture of unspecified part of neck of left femur, initial encounter for closed fracture: Secondary | ICD-10-CM | POA: Diagnosis not present

## 2019-11-25 DIAGNOSIS — F411 Generalized anxiety disorder: Secondary | ICD-10-CM

## 2019-11-25 DIAGNOSIS — M810 Age-related osteoporosis without current pathological fracture: Secondary | ICD-10-CM

## 2019-11-25 DIAGNOSIS — Z66 Do not resuscitate: Secondary | ICD-10-CM

## 2019-11-25 DIAGNOSIS — D62 Acute posthemorrhagic anemia: Secondary | ICD-10-CM | POA: Diagnosis not present

## 2019-11-25 DIAGNOSIS — D519 Vitamin B12 deficiency anemia, unspecified: Secondary | ICD-10-CM | POA: Diagnosis not present

## 2019-11-25 DIAGNOSIS — S81811D Laceration without foreign body, right lower leg, subsequent encounter: Secondary | ICD-10-CM

## 2019-11-25 DIAGNOSIS — I4891 Unspecified atrial fibrillation: Secondary | ICD-10-CM | POA: Diagnosis not present

## 2019-11-25 DIAGNOSIS — K224 Dyskinesia of esophagus: Secondary | ICD-10-CM | POA: Diagnosis not present

## 2019-11-25 NOTE — Progress Notes (Signed)
Provider:  Rexene Edison. Mariea Clonts, D.O., C.M.D. Location:  [place] of Service:  SNF (76)  PCP: Lajean Manes, MD Patient Care Team: Lajean Manes, MD as PCP - General (Internal Medicine) Jerline Pain, MD as PCP - Cardiology (Cardiology)  Extended Emergency Contact Information Primary Emergency Contact: Justice,Jane Work Phone: [phone] Mobile Phone: [phone] Relation: Other Preferred language: English Interpreter needed? No Secondary Emergency Contact: [employer] Phone: [phone] Relation: Relative  Code Status: DNR Goals of Care: Advanced Directive information Advanced Directives 11/25/2019  Does Patient Have a Medical Advance Directive? Yes  Type of Advance Directive Out of facility DNR (pink MOST or yellow form)  Does patient want to make changes to medical advance directive? No - Patient declined  Would patient like information on creating a medical advance directive? -  Pre-existing out of facility DNR order (yellow form or pink MOST form) Pink MOST/Yellow Form most recent copy in chart - Physician notified to receive inpatient order   Chief Complaint  Patient presents with  . New Admit To SNF    new admission to snf s/p hospitalization    HPI: Patient is a 86 y.o. female with h/o right hip fx s/p repair more than 10 yrs ago, wedge compression fx and mild protein calorie malnutrition per PCP notes, htn, hyperlipidemia who lived in her home with assistance (5 caregiver hours per day) seen today for new admission to [facility] s/p hospitalization from 8/16-8/24/21 with a fall onto her left hip after tripping.  She was having considerable pain with movement.  She was hypertensive and tachycardic with mild leukocytosis with left shift in ED.  Xrays showed left displaced angulated hip fracture of femoral neck.  She also had a left calf skin tear.   She underwent left hip hemiarthroplasty on  11/14/19 by Dr. Lyla Glassing.  Her pain was managed with tylenol, norco and initially IV morphine.  She was put on eliquis as her dvt prophylaxis.  She will require f/u with ortho.  She is receiving norco and tylenol for pain mgt.  Her potassium and mag were low and repleted.  She had a prolonged QTc from her electrolyte abnormalities that resolved.  She also was hydrated with IVFs.  She went into new onset afib while in the hospital.  Cardiac enzymes were negative.  Echo showed normal EF, no wall motion abnormalities, grade 1 diastolic dysfunction, mild LVH, mild MVR, normal left atrial size.  She ws seen by cardiology who started a low dose beta blocker.  She also had some runs of SVT and paroxysmal atrial tachycardia so dose was increased.  Anticoagulation was not recommended b/c of the inciting event being her hip fx/surgery; however, due to these two factors together, she was put on eliquis as her DVT prophylaxis so a decision will need to be made about longer term anticoagulation when she has outpatient cardiology f/u.  Of note, her norvasc was stopped when the beta blocker was needed.  Her reactive leukocytosis resolved, but she had postop acute blood loss anemia plus her baseline anemia of chronic disease/b12 deficiency.  hgb stablized at 9.9 at d/c.  She was given [place] b12  And IV feraheme in the hospital and discharged on oral b12 and iron supplementation.    As far as her poor intake and malnutrition, she reported dysphagia and food sticking and burping.  She had a barium esophagram with a tortuous distal esophagus with slow  food passage.  No obstruction.  She did have a moderate hiatal hernia.  The tortuosity was mildly progressed vs 2016.  ST evaluated her and she was sent to [facility] on a dysphagia 3 diet and will be followed here by ST.  Procedures:  Plain films of the left shoulder 11/14/2019  Plain films of the pelvis 11/14/2019  Chest x-ray 11/14/2019  Plain films of the left hip and  pelvis 11/14/2019  Plain films of the right tib-fib 11/14/2019  Plain films of the pelvis 11/14/2019  Left hip hemiarthroplasty per Dr. Lyla Glassing 11/14/2019  2D echo 11/17/2019  Barium esophagram 11/18/2019  Consultations:  Orthopedics: Dr. Lyla Glassing 11/14/2019  Palliative care: Vinie Sill, NP [id number]/5681  Cardiology: Dr. Marlou Porch 11/17/2019  She had her moderna covid vaccines 1/21 and 2/21.    She is very HOH.    When seen, she was doing ok.  She was having periods of anxiety and calling for staff to enter her room frequently.  She also did report that food sticks at times, but this has been evaluated fully as above.  She is eager to get back home, but knows she needs to be able to get around better to do that even with her 5 hrs of caregivers.  Past Medical History:  Diagnosis Date  . Arthritis   . Hip fracture [facility]) 2009   surgery  . History of depression   . History of gallstones   . History of IBS   . History of vertigo   . Hyperlipidemia   . Hypertension   . RBBB (right bundle branch block)    Past Surgical History:  Procedure Laterality Date  . CHOLECYSTECTOMY  2008   Dr. Harlow Asa  . [place]   hysterectomy  . TONSILECTOMY, ADENOIDECTOMY, BILATERAL MYRINGOTOMY AND TUBES     age 6 tonsils and adnoids only  . TOTAL HIP ARTHROPLASTY  02/20/2008   fractured hip  . TOTAL HIP ARTHROPLASTY Left 11/14/2019   Procedure: LEFT HIP HEMIARTHROPLASTY ANTERIOR APPROACH;  Surgeon: Rod Can, MD;  Location: [facility] ORS;  Service: Orthopedics;  Laterality: Left;    Social History   Socioeconomic History  . Marital status: Widowed    Spouse name: Not on file  . Number of children: 3  . Years of education: Not on file  . Highest education level: Not on file  Occupational History  . Occupation: Retired  Tobacco Use  . Smoking status: Never Smoker  . Smokeless tobacco: Never Used  Substance and Sexual Activity  . Alcohol use: No    Alcohol/week: 0.0  standard drinks  . Drug use: No  . Sexual activity: Not on file  Other Topics Concern  . Not on file  Social History Narrative  . Not on file   Social Determinants of Health   Financial Resource Strain:   . Difficulty of Paying Living Expenses: Not on file  Food Insecurity:   . Worried About Charity fundraiser in the Last Year: Not on file  . Ran Out of Food in the Last Year: Not on file  Transportation Needs:   . Lack of Transportation (Medical): Not on file  . Lack of Transportation (Non-Medical): Not on file  Physical Activity:   . Days of Exercise per Week: Not on file  . Minutes of Exercise per Session: Not on file  Stress:   . Feeling of Stress : Not on file  Social Connections:   . Frequency of Communication with Friends and  Family: Not on file  . Frequency of Social Gatherings with Friends and Family: Not on file  . Attends Religious Services: Not on file  . Active Member of Clubs or Organizations: Not on file  . Attends Archivist Meetings: Not on file  . Marital Status: Not on file    reports that she has never smoked. She has never used smokeless tobacco. She reports that she does not drink alcohol and does not use drugs.  Functional Status Survey:    Family History  Problem Relation Age of Onset  . Tuberculosis Mother        very young age  . Other Father        suicide  . Colon cancer Sister        colon  . Kidney disease Brother   . Heart disease Brother   . Colon polyps Neg Hx   . Diabetes Neg Hx   . Esophageal cancer Neg Hx     Health Maintenance  Topic Date Due  . DEXA SCAN  Never done  . PNA vac Low Risk Adult (1 of 2 - PCV13) Never done  . INFLUENZA VACCINE  10/30/2019  . TETANUS/TDAP  11/13/2029  . COVID-19 Vaccine  Completed    Allergies  Allergen Reactions  . Codeine Nausea And Vomiting  . Crestor [Rosuvastatin Calcium]     myalgias  . Escitalopram Oxalate     Abdominal problem  . Ezetimibe-Simvastatin     myalgias      Outpatient Encounter Medications as of 11/25/2019  Medication Sig  . acetaminophen (TYLENOL) 500 MG tablet Take 500 mg by mouth every 6 (six) hours as needed for moderate pain.  Marland Kitchen apixaban (ELIQUIS) 2.5 MG TABS tablet Take 1 tablet (2.5 mg total) by mouth 2 (two) times daily.  Marland Kitchen BISACODYL LAXATIVE RE Place 10 mg rectally as needed.  . docusate sodium (COLACE) 100 MG capsule Take 1 capsule (100 mg total) by mouth 2 (two) times daily.  Marland Kitchen HYDROcodone-acetaminophen (NORCO/VICODIN) 5-325 MG tablet Take 1 tablet by mouth every 4 (four) hours as needed for moderate pain.  . hydrOXYzine (ATARAX/VISTARIL) 10 MG tablet Take 1 tablet (10 mg total) by mouth 3 (three) times daily as needed for itching, anxiety or nausea.  . iron polysaccharides (NU-IRON) 150 MG capsule Take 1 capsule (150 mg total) by mouth daily.  . Magnesium Hydroxide (MILK OF MAGNESIA PO) Take 30 mLs by mouth.  . melatonin 3 MG TABS tablet Take 1 tablet (3 mg total) by mouth at bedtime as needed (INSOMNIA).  Marland Kitchen metoprolol tartrate (LOPRESSOR) 25 MG tablet Take 1 tablet (25 mg total) by mouth 2 (two) times daily.  . ondansetron (ZOFRAN) 4 MG tablet Take 4 mg by mouth 2 (two) times daily as needed for nausea/vomiting.  . pantoprazole (PROTONIX) 40 MG tablet Take 1 tablet (40 mg total) by mouth daily at 6 (six) AM.  . polyethylene glycol (MIRALAX / GLYCOLAX) 17 g packet Take 17 g by mouth daily.  . Sodium Phosphates (RA SALINE ENEMA RE) Place rectally as needed.  . vitamin B-12 (CYANOCOBALAMIN) 1000 MCG tablet Take 1 tablet (1,000 mcg total) by mouth daily.  . [DISCONTINUED] diltiazem (CARDIZEM CD) 120 MG 24 hr capsule Take 1 capsule (120 mg total) by mouth daily. (Patient not taking: Reported on 05/13/2018)  . [DISCONTINUED] esomeprazole (NEXIUM) 40 MG capsule Take 1 capsule (40 mg total) by mouth 2 (two) times daily before a meal. (Patient not taking: Reported on 05/13/2018)  . [  DISCONTINUED] metoCLOPramide (REGLAN) 5 MG tablet Take 1  tablet (5 mg total) by mouth 3 (three) times daily with meals as needed for nausea (take 51mins before meals). (Patient not taking: Reported on 05/13/2018)  . [DISCONTINUED] promethazine (PHENERGAN) 12.5 MG tablet Take 1 tablet (12.5 mg total) by mouth every 6 (six) hours as needed for nausea or vomiting. (Patient not taking: Reported on 05/13/2018)   No facility-administered encounter medications on file as of 11/25/2019.    Review of Systems  Constitutional: Negative for chills, fever and weight loss.       Wt recently stable around 100  HENT: Negative for congestion, hearing loss and sore throat.   Eyes: Negative for blurred vision.  Respiratory: Negative for cough and shortness of breath.   Cardiovascular: Negative for chest pain, palpitations and leg swelling.  Gastrointestinal: Negative for abdominal pain, blood in stool, constipation and melena.       Tortuous esophagus  Genitourinary: Negative for dysuria.  Musculoskeletal: Positive for falls and joint pain.       Pain controlled in hip  Skin: Negative for rash.  Neurological: Positive for weakness. Negative for dizziness and loss of consciousness.  Endo/Heme/Allergies: Bruises/bleeds easily.  Psychiatric/Behavioral: Negative for depression. The patient is nervous/anxious.     Vitals:   11/25/19 0846  BP: 126/66  Pulse: 67  Temp: (!) 97.4 F (36.3 C)  Weight: 100 lb 12.8 oz (45.7 kg)  Height: 5' (1.524 m)   Body mass index is 19.69 kg/m. Physical Exam Vitals reviewed.  Constitutional:      General: She is not in acute distress.    Appearance: Normal appearance. She is not toxic-appearing.     Comments: Petite frail lady  HENT:     Head: Normocephalic and atraumatic.     Right Ear: External ear normal.     Left Ear: External ear normal.     Ears:     Comments: HOH    Nose: Nose normal.     Mouth/Throat:     Pharynx: Oropharynx is clear.  Eyes:     Extraocular Movements: Extraocular movements intact.      Conjunctiva/sclera: Conjunctivae normal.     Pupils: Pupils are equal, round, and reactive to light.  Cardiovascular:     Rate and Rhythm: Rhythm irregular.     Heart sounds: No murmur heard.   Pulmonary:     Effort: Pulmonary effort is normal.     Breath sounds: Normal breath sounds. No rales.  Abdominal:     General: Bowel sounds are normal.     Palpations: Abdomen is soft.     Tenderness: There is no abdominal tenderness. There is no guarding or rebound.  Musculoskeletal:        General: Normal range of motion.     Cervical back: Neck supple.     Right lower leg: No edema.     Left lower leg: No edema.  Skin:    General: Skin is warm and dry.     Comments: incision site on left hip clean, dry, no surrounding erythema, warmth   Neurological:     General: No focal deficit present.     Mental Status: She is alert and oriented to person, place, and time.     Cranial Nerves: No cranial nerve deficit.     Sensory: No sensory deficit.     Motor: Weakness present.     Coordination: Coordination normal.     Gait: Gait abnormal.     Deep  Tendon Reflexes: Reflexes normal.     Comments: Required CNA to assist her to stand from recliner up to walker for hip exam  Psychiatric:        Behavior: Behavior normal.        Thought Content: Thought content normal.     Comments: Anxious, but pleasant and appropriate     Labs reviewed: Basic Metabolic Panel: Recent Labs    11/17/19 0302 11/17/19 0819 11/18/19 0516 11/19/19 0602 11/20/19 0518 11/21/19 0600 11/22/19 0554  NA   < >  --  138   < > 137 142 139  K   < >  --  3.5   < > 3.5 4.1 4.7  CL   < >  --  107   < > 106 105 102  CO2   < >  --  21*   < > 22 25 26   GLUCOSE   < >  --  91   < > 98 97 106*  BUN   < >  --  12   < > 11 10 11   CREATININE   < >  --  0.42*   < > 0.52 0.54 0.65  CALCIUM   < >  --  8.7*   < > 8.8* 9.5 9.7  MG  --  2.2 2.0  --   --   --  2.2   < > = values in this interval not displayed.   Liver Function  Tests: No results for input(s): AST, ALT, ALKPHOS, BILITOT, PROT, ALBUMIN in the last 8760 hours. No results for input(s): LIPASE, AMYLASE in the last 8760 hours. No results for input(s): AMMONIA in the last 8760 hours. CBC: Recent Labs    11/14/19 0655 11/15/19 0320 11/17/19 0302 11/17/19 0302 11/18/19 0516 11/18/19 0516 11/19/19 0602 11/20/19 0518 11/21/19 0600  WBC 10.7*   < > 9.1  --  7.9  --  9.0  --   --   NEUTROABS 9.1*  --   --   --   --   --   --   --   --   HGB 13.1   < > 9.7*   < > 9.0*   < > 10.5* 9.0* 9.9*  HCT 40.8   < > 30.5*   < > 28.2*   < > 33.1* 27.9* 32.5*  MCV 91.7   < > 92.1  --  91.3  --  92.7  --   --   PLT 272   < > 250  --  298  --  373  --   --    < > = values in this interval not displayed.   Cardiac Enzymes: No results for input(s): CKTOTAL, CKMB, CKMBINDEX, TROPONINI in the last 8760 hours. BNP: Invalid input(s): POCBNP Lab Results  Component Value Date   HGBA1C (H) 01/27/2010    5.9 (NOTE)                                                                       According to the ADA Clinical Practice Recommendations for 2011, when HbA1c is used as a screening test:   >=6.5%   Diagnostic of Diabetes Mellitus           (  if abnormal result  is confirmed)  5.7-6.4%   Increased risk of developing Diabetes Mellitus  References:Diagnosis and Classification of Diabetes Mellitus,Diabetes UEAV,4098,11(BJYNW 1):S62-S69 and Standards of Medical Care in         Diabetes - 2011,Diabetes GNFA,2130,86  (Suppl 1):S11-S61.   Lab Results  Component Value Date   TSH 1.810 11/17/2019   Lab Results  Component Value Date   VITAMINB12 159 (L) 11/17/2019   Lab Results  Component Value Date   FOLATE 8.9 11/17/2019   Lab Results  Component Value Date   IRON 14 (L) 11/17/2019   TIBC 228 (L) 11/17/2019   FERRITIN 187 11/17/2019    Imaging and Procedures obtained prior to SNF admission: DG Chest 1 View  Result Date: 11/14/2019 CLINICAL DATA:  Fall.  Severe left  hip pain. EXAM: CHEST  1 VIEW COMPARISON:  Chest x-ray dated January 26, 2010. FINDINGS: The heart size and mediastinal contours are within normal limits. Normal pulmonary vascularity. Insert elevation of the right hemidiaphragm. No acute osseous abnormality. 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 IMPRESSION: 1. No active disease. Electronically Signed   By: Titus Dubin M.D.   On: 11/14/2019 08:07   DG Tibia/Fibula Right  Result Date: 11/14/2019 CLINICAL DATA:  Suspected hip fracture.  RIGHT lower leg pain. EXAM: RIGHT TIBIA AND FIBULA - 2 VIEW COMPARISON:  None FINDINGS: Osteopenia. Degenerative changes about the RIGHT knee incidentally noted, incompletely imaged. No signs of acute fracture or dislocation. IMPRESSION: 1. No signs of acute fracture or dislocation. 2. Osteopenia and degenerative changes. Electronically Signed   By: Zetta Bills M.D.   On: 11/14/2019 08:01   Pelvis Portable  Result Date: 11/14/2019 CLINICAL DATA:  Status post left hip arthroplasty. EXAM: PORTABLE PELVIS 1-2 VIEWS COMPARISON:  Left hip radiographs and intraoperative fluoroscopic images from 11/14/2019 FINDINGS: Sequelae of left hip hemiarthroplasty are again identified with postoperative gas in the surrounding soft tissues and skin staples in place. The prosthetic femoral head is approximated with the native acetabulum on this single image. No acute fracture is identified. Remote right femoral ORIF is again noted. IMPRESSION: Left hip hemiarthroplasty as above. Electronically Signed   By: Logan Bores M.D.   On: 11/14/2019 14:33   DG Shoulder Left Port  Result Date: 11/14/2019 CLINICAL DATA:  Left shoulder pain after fall. EXAM: LEFT SHOULDER COMPARISON:  None. FINDINGS: No acute fracture or dislocation. Old healed left fifth and sixth rib fractures. Mild acromioclavicular degenerative changes with marginal spurring. Osteopenia. Soft tissues are unremarkable. IMPRESSION: 1. No  acute osseous abnormality. Electronically Signed   By: Titus Dubin M.D.   On: 11/14/2019 14:31   DG C-Arm 1-60 Min-No Report  Result Date: 11/14/2019 Fluoroscopy was utilized by the requesting physician.  No radiographic interpretation.   DG HIP OPERATIVE UNILAT W OR W/O PELVIS LEFT  Result Date: 11/14/2019 CLINICAL DATA:  Elective surgery EXAM: OPERATIVE LEFT HIP (WITH PELVIS IF PERFORMED) 2 VIEWS TECHNIQUE: Fluoroscopic spot image(s) were submitted for interpretation post-operatively. COMPARISON:  11/14/2019 FLUOROSCOPY TIME 0 minutes 10 seconds Dose: 0.8511 mGy Images: 2 FINDINGS: Osseous demineralization. LEFT hip prosthesis. No acute fracture, dislocation, or bone destruction. IMPRESSION: LEFT hip prosthesis without acute complication. Electronically Signed   By: Lavonia Dana M.D.   On: 11/14/2019 13:25   DG Hip Unilat W or Wo Pelvis 2-3 Views Left  Result Date: 11/14/2019 CLINICAL DATA:  Suspected hip fracture patient from home with un witnessed fall complaining of severe LEFT hip  pain EXAM: DG HIP (WITH OR WITHOUT PELVIS) 2-3V LEFT COMPARISON:  May 13, 2018 FINDINGS: Signs of displaced and angulated fracture with varus deformity of the LEFT femoral neck. Superior migration of the LEFT femur. Femoral head remains within the acetabulum. Evidence of prior RIGHT femoral ORIF. Some anterior displacement of distal femur relative to femoral head on the cross-table lateral. Evidence of fracture, previous fracture of the RIGHT inferior pubic ramus. IMPRESSION: 1. Signs of displaced and angulated fracture of the LEFT femoral neck. 2. Prior RIGHT femoral ORIF. 3. Signs of prior fracture of the RIGHT inferior pubic ramus.- Electronically Signed   By: Zetta Bills M.D.   On: 11/14/2019 07:52    Assessment/Plan 1. Closed left hip fracture, initial encounter ([place]) -s/p anterior left hip hemiarthroplasty with Dr. Lyla Glassing -cont eliquis for DVT prophylaxis and possibly long-term if cardiology  determines she has truly had afib -cont PT, OT here to get her stronger--her goal is to return home with caregivers as she previously had  2. Senile osteoporosis -not currently on treatment, unknown if she was in the past in outpatient world or never before  3. Noninfected skin tear of right lower extremity, subsequent encounter -gradually healing with management by wound care nurse here  4. DNR (do not resuscitate) - Do not attempt resuscitation (DNR) reviewed and order entered per wishes  5. New onset atrial fibrillation (HCC) -possibly vs sinus tach or atrial tach according to notes--had seen Dr. Marlou Porch and needs outpatient f/u  -is on eliquis at this time for DVT prophylaxis also pending decision here  6. Essential hypertension -bp well controlled, not c/o dizziness, cont same regimen  7. Esophageal dysmotility -to follow speech pathology recommendations for postural adjustments, sips of water after bites, and continuing soft foods, may try puree, but often this is not appealing   8. Postoperative anemia due to acute blood loss -in combo with her b12 deficiency -f/u cbc in one week  9. Anxiety state -is getting hydroxyzine for this when needed since hospitalization--should not be continued at home and should be short-term here during adjustment  10. Anemia due to vitamin B12 deficiency, unspecified B12 deficiency type -cont b12 supplement   Family/ staff Communication: discussed with dietary a bit, nursing  Labs/tests ordered:  Cbc, bmp in 1 wk  Heaven Meeker L. Lakysha Kossman, D.O. [facility] [address], [place] [address] Cell Phone (Mon-Fri 8am-5pm):  [phone] On Call:  [phone] & follow prompts after 5pm & weekends Office Phone:  [phone] Office Fax:  [phone]

## 2019-11-28 NOTE — Progress Notes (Deleted)
CARDIOLOGY OFFICE NOTE  Date:  11/28/2019    [name] [name] Date of Birth: 01/28/1923 Medical Record #[id number]  PCP:  Lajean Manes, MD  Cardiologist:  Marlou Porch ([place])    No chief complaint on file.   History of Present Illness: [name] [name] is a 86 y.o. female who presents today for a post hospital visit. Former patient of Dr. Sherryl Barters. Now seen for Dr. Marlou Porch ([facility]).   She has a history of HTN, HLD, RBBB and was admitted after a fall and had fractured left hip. She is a DNR. Dr. Marlou Porch saw for AF. She did undergo surgical repair and had post op AF with chest pain.  Comes in today. Here with   Past Medical History:  Diagnosis Date  . Arthritis   . Hip fracture [facility]) 2009   surgery  . History of depression   . History of gallstones   . History of IBS   . History of vertigo   . Hyperlipidemia   . Hypertension   . RBBB (right bundle branch block)     Past Surgical History:  Procedure Laterality Date  . CHOLECYSTECTOMY  2008   Dr. Harlow Asa  . [facility]   hysterectomy  . TONSILECTOMY, ADENOIDECTOMY, BILATERAL MYRINGOTOMY AND TUBES     age 64 tonsils and adnoids only  . TOTAL HIP ARTHROPLASTY  02/20/2008   fractured hip  . TOTAL HIP ARTHROPLASTY Left 11/14/2019   Procedure: LEFT HIP HEMIARTHROPLASTY ANTERIOR APPROACH;  Surgeon: Rod Can, MD;  Location: [facility];  Service: Orthopedics;  Laterality: Left;     Medications: No outpatient medications have been marked as taking for the 12/12/19 encounter (Appointment) with Burtis Junes, NP.     Allergies: Allergies  Allergen Reactions  . Codeine Nausea And Vomiting  . Crestor [Rosuvastatin Calcium]     myalgias  . Escitalopram Oxalate     Abdominal problem  . Ezetimibe-Simvastatin     myalgias    Social History: The patient  reports that she has never smoked. She has never used smokeless tobacco. She reports that she does not drink alcohol and does not use drugs.   Family  History: The patient's ***family history includes Colon cancer in her sister; Heart disease in her brother; Kidney disease in her brother; Other in her father; Tuberculosis in her mother.   Review of Systems: Please see the history of present illness.   All other systems are reviewed and negative.   Physical Exam: VS:  There were no vitals taken for this visit. Marland Kitchen  BMI There is no height or weight on file to calculate BMI.  Wt Readings from Last 3 Encounters:  11/25/19 100 lb 12.8 oz (45.7 kg)  11/23/19 100 lb 12.8 oz (45.7 kg)  11/14/19 99 lb 3.3 oz (45 kg)    General: Pleasant. Well developed, well nourished and in no acute distress.   HEENT: Normal.  Neck: Supple, no JVD, carotid bruits, or masses noted.  Cardiac: ***Regular rate and rhythm. No murmurs, rubs, or gallops. No edema.  Respiratory:  Lungs are clear to auscultation bilaterally with normal work of breathing.  GI: Soft and nontender.  MS: No deformity or atrophy. Gait and ROM intact.  Skin: Warm and dry. Color is normal.  Neuro:  Strength and sensation are intact and no gross focal deficits noted.  Psych: Alert, appropriate and with normal affect.   LABORATORY DATA:  EKG:  EKG {ACTION; IS/IS VZC:[id number]} ordered today.  Personally  reviewed by me. This demonstrates ***.  Lab Results  Component Value Date   WBC 9.0 11/19/2019   HGB 9.9 (L) 11/21/2019   HCT 32.5 (L) 11/21/2019   PLT 373 11/19/2019   GLUCOSE 106 (H) 11/22/2019   CHOL  12/17/2006    127        ATP III CLASSIFICATION:  <200     mg/dL   Desirable  200-239  mg/dL   Borderline High  >=240    mg/dL   High   TRIG 75 12/17/2006   HDL 47 12/17/2006   LDLCALC  12/17/2006    65        Total Cholesterol/HDL:CHD Risk Coronary Heart Disease Risk Table                     Men   Women  1/2 Average Risk   3.4   3.3   ALT 18 07/07/2014   AST 22 07/07/2014   NA 139 11/22/2019   K 4.7 11/22/2019   CL 102 11/22/2019   CREATININE 0.65 11/22/2019   BUN 11  11/22/2019   CO2 26 11/22/2019   TSH 1.810 11/17/2019   INR 1.05 01/26/2010   HGBA1C (H) 01/27/2010    5.9 (NOTE)                                                                       According to the ADA Clinical Practice Recommendations for 2011, when HbA1c is used as a screening test:   >=6.5%   Diagnostic of Diabetes Mellitus           (if abnormal result  is confirmed)  5.7-6.4%   Increased risk of developing Diabetes Mellitus  References:Diagnosis and Classification of Diabetes Mellitus,Diabetes WRUE,4540,98(JXBJY 1):S62-S69 and Standards of Medical Care in         Diabetes - 2011,Diabetes Care,2011,34  (Suppl 1):S11-S61.     BNP (last 3 results) No results for input(s): BNP in the last 8760 hours.  ProBNP (last 3 results) No results for input(s): PROBNP in the last 8760 hours.   Other Studies Reviewed Today:  ECHO IMPRESSIONS 10/2019  1. Left ventricular ejection fraction, by estimation, is 60 to 65%. The  left ventricle has normal function. The left ventricle has no regional  wall motion abnormalities. There is mild left ventricular hypertrophy.  Left ventricular diastolic parameters  are consistent with Grade I diastolic dysfunction (impaired relaxation).  2. Right ventricular systolic function is normal. The right ventricular  size is normal. There is normal pulmonary artery systolic pressure.  3. The mitral valve is abnormal. Mild mitral valve regurgitation.  4. The aortic valve is abnormal. Aortic valve regurgitation is not  visualized. Mild aortic valve sclerosis is present, with no evidence of  aortic valve stenosis.  5. The inferior vena cava is normal in size with greater than 50%  respiratory variability, suggesting right atrial pressure of 3 mmHg.      Assessment and Plan:   1. A fib with RVR new onset.  + chest pain associated with rapid HR and neg troponin, no ST changes on EKG.  Echo ordered, will see EF  And WMA,  With her age most likely CAD, but  unless recurrent pain  would hold on ischemic eval.    cha2DS2VAsc 4, with age, HTN female -- though short episodes of PAF.  Will add BB for now to prevent more episodes with thought to BB vs dilt at discharge.  Dr. Marlou Porch to see. 2. fx Lt hip with Lefthip hemiarthroplasty, anterior approach.  POD # 3 per ortho 3. HTN was on amlodipine prior to admit held for lower BP but has improved.  Will do BB for now.      Current medicines are reviewed with the patient today.  The patient does not have concerns regarding medicines other than what has been noted above.  The following changes have been made:  See above.  Labs/ tests ordered today include:   No orders of the defined types were placed in this encounter.    Disposition:   FU with *** in {gen number 6-24:[id number]} {Days to years:10300}.   Patient is agreeable to this plan and will call if any problems develop in the interim.   SignedTruitt Merle, NP  11/28/2019 8:41 AM  [facility] [address] [place], [place]  [address] Phone: [phone] Fax: [phone]

## 2019-11-29 LAB — BASIC METABOLIC PANEL
BUN: 8 (ref 4–21)
CO2: 21 (ref 13–22)
Chloride: 98 — AB (ref 99–108)
Creatinine: 0.5 (ref 0.5–1.1)
Glucose: 89
Potassium: 4.4 (ref 3.4–5.3)
Sodium: 133 — AB (ref 137–147)

## 2019-11-29 LAB — CBC: RBC: 3.45 — AB (ref 3.87–5.11)

## 2019-11-29 LAB — CBC AND DIFFERENTIAL
HCT: 31 — AB (ref 36–46)
Hemoglobin: 10.1 — AB (ref 12.0–16.0)
Platelets: 457 — AB (ref 150–399)
WBC: 4.1

## 2019-11-29 LAB — COMPREHENSIVE METABOLIC PANEL
Calcium: 9.2 (ref 8.7–10.7)
GFR calc Af Amer: >90
GFR calc non Af Amer: 82.33

## 2019-12-01 ENCOUNTER — Telehealth: Payer: Self-pay | Admitting: Family

## 2019-12-01 NOTE — Telephone Encounter (Signed)
COVID-19 Test received positive.Asymptomatic.Dr.Reed notified recommend monoclonal antibody treatment.Order written and discussed with facility Nurse.

## 2019-12-08 ENCOUNTER — Non-Acute Institutional Stay (SKILLED_NURSING_FACILITY): Payer: Medicare Other | Admitting: Family

## 2019-12-08 ENCOUNTER — Encounter: Payer: Self-pay | Admitting: Family

## 2019-12-08 DIAGNOSIS — R634 Abnormal weight loss: Secondary | ICD-10-CM | POA: Diagnosis not present

## 2019-12-08 DIAGNOSIS — F321 Major depressive disorder, single episode, moderate: Secondary | ICD-10-CM

## 2019-12-08 NOTE — Progress Notes (Signed)
Location:    [facility].   N[facility]ing Home Room Number: 422-W Place of Service:  SNF (31) Provider:  Marlowe Sax, NP    Patient Care Team: Lajean Manes, MD as PCP - General (Internal Medicine) Jerline Pain, MD as PCP - Cardiology (Cardiology)  Extended Emergency Contact Information Primary Emergency Contact: Justice,Jane Work Phone: [phone] Mobile Phone: [phone] Relation: Other Preferred language: English Interpreter needed? No Secondary Emergency Contact: [place] [employer] Phone: [phone] Relation: Relative  Code Status:  DNR Goals of care: Advanced Directive information Advanced Directives 12/08/2019  Does Patient Have a Medical Advance Directive? Yes  Type of Advance Directive Out of facility DNR (pink MOST or yellow form)  Does patient want to make changes to medical advance directive? No - Patient declined  Would patient like information on creating a medical advance directive? -  Pre-existing out of facility DNR order (yellow form or pink MOST form) Pink MOST/Yellow Form most recent copy in chart - Physician notified to receive inpatient order     Chief Complaint  Patient presents with   Acute Visit    Very poor oral intake.    HPI:  Pt is a 86 y.o. female seen today for an acute visit for poor oral intake and weight loss.Facility Registered Dietician states patient's poor oral intake is related to esophageal dysphagia and frequent vomits when unable to swallow during meals.she has had 6.6 lbs over one week.she has worked with ST.she had a barium esophagram with a tortuous distal esophagus with slow food passage noted during recent hospital admission.No obstruction.Also had a moderate hiatal hernia.she was discharged on dysphagia 3 diet. Patient currently on isolation for COVID-19 positive.Discussed significant weight loss and poor oral intake with patient .she stated absolutely no Feeding tube. She continue  to states " somebody put a gun" Pointing at her head.She made it clear that she does not want a feeding tube.she states feeling depressed being on isolation.Nurse states tries to get staff to stay with her.Observed taking her morning medication crashed and mixed in pudding took two bites then told Nurse " enough".No vomiting noted.she likes to drink milk she agrees to talk an antidepressant.she denies any fever,chills or cough.Has seven more days of isolation.  She is status post left hip hemiarthroplasty on 11/14/2019 with Dr.Swinteck.states pain under control.     Past Medical History:  Diagnosis Date   Arthritis    Hip fracture ([place]) 2009   surgery   History of depression    History of gallstones    History of IBS    History of vertigo    Hyperlipidemia    Hypertension    RBBB (right bundle branch block)    Past Surgical History:  Procedure Laterality Date   CHOLECYSTECTOMY  2008   Dr. Harlow Asa   OTHER SURGICAL HISTORY  1973   hysterectomy   TONSILECTOMY, ADENOIDECTOMY, BILATERAL MYRINGOTOMY AND TUBES     age 71 tonsils and adnoids only   TOTAL HIP ARTHROPLASTY  02/20/2008   fractured hip   TOTAL HIP ARTHROPLASTY Left 11/14/2019   Procedure: LEFT HIP HEMIARTHROPLASTY ANTERIOR APPROACH;  Surgeon: Rod Can, MD;  Location: [facility];  Service: Orthopedics;  Laterality: Left;    Allergies  Allergen Reactions   Codeine Nausea And Vomiting   Crestor [Rosuvastatin Calcium]     myalgias   Escitalopram Oxalate     Abdominal problem   Ezetimibe-Simvastatin     myalgias    Allergies as of  12/08/2019      Reactions   Codeine Nausea And Vomiting   Crestor [rosuvastatin Calcium]    myalgias   Escitalopram Oxalate    Abdominal problem   Ezetimibe-simvastatin    myalgias      Medication List       Accurate as of December 08, 2019  9:46 AM. If you have any questions, ask your nurse or doctor.        STOP taking these medications     HYDROcodone-acetaminophen 5-325 MG tablet Commonly known as: NORCO/VICODIN Stopped by: Sandrea Hughs, NP   hydrOXYzine 10 MG tablet Commonly known as: ATARAX/VISTARIL Stopped by: Sandrea Hughs, NP     TAKE these medications   acetaminophen 500 MG tablet Commonly known as: TYLENOL Take 500 mg by mouth every 6 (six) hours as needed for moderate pain.   apixaban 2.5 MG Tabs tablet Commonly known as: ELIQUIS Take 1 tablet (2.5 mg total) by mouth 2 (two) times daily.   BISACODYL LAXATIVE RE Place 10 mg rectally as needed.   docusate sodium 100 MG capsule Commonly known as: COLACE Take 1 capsule (100 mg total) by mouth 2 (two) times daily.   iron polysaccharides 150 MG capsule Commonly known as: Nu-Iron Take 1 capsule (150 mg total) by mouth daily.   melatonin 3 MG Tabs tablet Take 1 tablet (3 mg total) by mouth at bedtime as needed (INSOMNIA).   metoprolol tartrate 25 MG tablet Commonly known as: LOPRESSOR Take 1 tablet (25 mg total) by mouth 2 (two) times daily.   MILK OF MAGNESIA PO Take 30 mLs by mouth.   ondansetron 4 MG tablet Commonly known as: ZOFRAN Take 4 mg by mouth 2 (two) times daily as needed for nausea/vomiting.   pantoprazole 40 MG tablet Commonly known as: PROTONIX Take 1 tablet (40 mg total) by mouth daily at 6 (six) AM.   polyethylene glycol 17 g packet Commonly known as: MIRALAX / GLYCOLAX Take 17 g by mouth daily.   RA SALINE ENEMA RE Place rectally as needed.   vitamin B-12 1000 MCG tablet Commonly known as: CYANOCOBALAMIN Take 1 tablet (1,000 mcg total) by mouth daily.       Review of Systems  Constitutional: Positive for appetite change and unexpected weight change. Negative for chills, fatigue and fever.  Respiratory: Negative for cough, chest tightness, shortness of breath and wheezing.   Cardiovascular: Negative for chest pain, palpitations and leg swelling.  Gastrointestinal: Negative for abdominal distention, abdominal  pain, constipation, diarrhea, nausea and vomiting.  Endocrine: Negative for cold intolerance, heat intolerance, polydipsia, polyphagia and polyuria.  Genitourinary: Negative for difficulty urinating, dysuria, flank pain, frequency and urgency.  Musculoskeletal: Positive for arthralgias and gait problem. Negative for joint swelling and myalgias.  Skin: Negative for color change, pallor and rash.       Left hip surgical incision   Neurological: Negative for dizziness, speech difficulty, light-headedness, numbness and headaches.  Hematological: Does not bruise/bleed easily.  Psychiatric/Behavioral: Negative for agitation, behavioral problems, confusion and sleep disturbance. The patient is not nervous/anxious.        Verbalized feeling depressed     Immunization History  Administered Date(s) Administered   Influenza, High Dose Seasonal PF 01/05/2018, 12/23/2018   Tdap 11/14/2019   Unspecified SARS-COV-2 Vaccination 04/01/2019, 05/02/2019   Pertinent  Health Maintenance Due  Topic Date Due   DEXA SCAN  Never done   PNA vac Low Risk Adult (1 of 2 - PCV13) Never done   INFLUENZA  VACCINE  10/30/2019   No flowsheet data found.  Vitals:   12/08/19 0937  BP: 132/62  Pulse: (!) 58  Resp: 17  Temp: 97.7 F (36.5 C)  Weight: 100 lb (45.4 kg)  Height: 5' (1.524 m)   Body mass index is 19.53 kg/m. Physical Exam Vitals and nursing note reviewed.  Constitutional:      General: She is not in acute distress.    Appearance: She is not ill-appearing.  HENT:     Head: Normocephalic.  Eyes:     General: No scleral icterus.       Right eye: No discharge.        Left eye: No discharge.     Conjunctiva/sclera: Conjunctivae normal.     Pupils: Pupils are equal, round, and reactive to light.  Cardiovascular:     Rate and Rhythm: Normal rate and regular rhythm.     Pulses: Normal pulses.     Heart sounds: Normal heart sounds.  Pulmonary:     Effort: Pulmonary effort is normal. No  respiratory distress.     Breath sounds: Normal breath sounds. No wheezing, rhonchi or rales.  Chest:     Chest wall: No tenderness.  Abdominal:     General: Bowel sounds are normal. There is no distension.     Palpations: Abdomen is soft. There is no mass.     Tenderness: There is no abdominal tenderness. There is no right CVA tenderness, left CVA tenderness, guarding or rebound.  Musculoskeletal:        General: No swelling or tenderness.     Right lower leg: No edema.     Left lower leg: No edema.     Comments: Unsteady gait  Skin:    General: Skin is warm and dry.     Coloration: Skin is not pale.     Findings: No bruising or erythema.     Comments: Left hip surgical incision with Aquacel dressing dry,clean and intake.surrounding skin without any erythema.   Neurological:     Mental Status: She is alert. Mental status is at baseline.     Motor: No weakness.     Gait: Gait abnormal.     Comments: HOH   Psychiatric:        Mood and Affect: Mood is depressed.        Speech: Speech normal.        Behavior: Behavior normal.        Thought Content: Thought content normal.    Labs reviewed: Recent Labs    11/17/19 0302 11/17/19 0819 11/18/19 0516 11/19/19 0602 11/20/19 0518 11/20/19 0518 11/21/19 0600 11/22/19 0554 11/29/19 0000  NA   < >  --  138   < > 137   < > 142 139 133*  K   < >  --  3.5   < > 3.5   < > 4.1 4.7 4.4  CL   < >  --  107   < > 106   < > 105 102 98*  CO2   < >  --  21*   < > 22   < > 25 26 21   GLUCOSE   < >  --  91   < > 98  --  97 106*  --   BUN   < >  --  12   < > 11   < > 10 11 8   CREATININE   < >  --  0.42*   < >  0.52   < > 0.54 0.65 0.5  CALCIUM   < >  --  8.7*   < > 8.8*   < > 9.5 9.7 9.2  MG  --  2.2 2.0  --   --   --   --  2.2  --    < > = values in this interval not displayed.    Recent Labs    11/14/19 0655 11/15/19 0320 11/17/19 0302 11/17/19 0302 11/18/19 0516 11/18/19 0516 11/19/19 0602 11/19/19 0602 11/20/19 0518  11/21/19 0600 11/29/19 0000  WBC 10.7*   < > 9.1   < > 7.9  --  9.0  --   --   --  4.1  NEUTROABS 9.1*  --   --   --   --   --   --   --   --   --   --   HGB 13.1   < > 9.7*   < > 9.0*   < > 10.5*   < > 9.0* 9.9* 10.1*  HCT 40.8   < > 30.5*   < > 28.2*   < > 33.1*   < > 27.9* 32.5* 31*  MCV 91.7   < > 92.1  --  91.3  --  92.7  --   --   --   --   PLT 272   < > 250   < > 298  --  373  --   --   --  457*   < > = values in this interval not displayed.   Lab Results  Component Value Date   TSH 1.810 11/17/2019   Lab Results  Component Value Date   HGBA1C (H) 01/27/2010    5.9 (NOTE)                                                                       According to the ADA Clinical Practice Recommendations for 2011, when HbA1c is used as a screening test:   >=6.5%   Diagnostic of Diabetes Mellitus           (if abnormal result  is confirmed)  5.7-6.4%   Increased risk of developing Diabetes Mellitus  References:Diagnosis and Classification of Diabetes Mellitus,Diabetes ZDGL,8756,43(PIRJJ 1):S62-S69 and Standards of Medical Care in         Diabetes - 2011,Diabetes OACZ,6606,30  (Suppl 1):S11-S61.   Lab Results  Component Value Date   CHOL  12/17/2006    127        ATP III CLASSIFICATION:  <200     mg/dL   Desirable  200-239  mg/dL   Borderline High  >=240    mg/dL   High   HDL 47 12/17/2006   LDLCALC  12/17/2006    65        Total Cholesterol/HDL:CHD Risk Coronary Heart Disease Risk Table                     Men   Women  1/2 Average Risk   3.4   3.3   TRIG 75 12/17/2006   CHOLHDL 2.7 12/17/2006    Significant Diagnostic Results in last 30 days:  DG Chest 1 View  Result Date: 11/14/2019  CLINICAL DATA:  Fall.  Severe left hip pain. EXAM: CHEST  1 VIEW COMPARISON:  Chest x-ray dated January 26, 2010. FINDINGS: The heart size and mediastinal contours are within normal limits. Normal pulmonary vascularity. Insert elevation of the right hemidiaphragm. No acute osseous abnormality.  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 IMPRESSION: 1. No active disease. Electronically Signed   By: Titus Dubin M.D.   On: 11/14/2019 08:07   DG Tibia/Fibula Right  Result Date: 11/14/2019 CLINICAL DATA:  Suspected hip fracture.  RIGHT lower leg pain. EXAM: RIGHT TIBIA AND FIBULA - 2 VIEW COMPARISON:  None FINDINGS: Osteopenia. Degenerative changes about the RIGHT knee incidentally noted, incompletely imaged. No signs of acute fracture or dislocation. IMPRESSION: 1. No signs of acute fracture or dislocation. 2. Osteopenia and degenerative changes. Electronically Signed   By: Zetta Bills M.D.   On: 11/14/2019 08:01   Pelvis Portable  Result Date: 11/14/2019 CLINICAL DATA:  Status post left hip arthroplasty. EXAM: PORTABLE PELVIS 1-2 VIEWS COMPARISON:  Left hip radiographs and intraoperative fluoroscopic images from 11/14/2019 FINDINGS: Sequelae of left hip hemiarthroplasty are again identified with postoperative gas in the surrounding soft tissues and skin staples in place. The prosthetic femoral head is approximated with the native acetabulum on this single image. No acute fracture is identified. Remote right femoral ORIF is again noted. IMPRESSION: Left hip hemiarthroplasty as above. Electronically Signed   By: Logan Bores M.D.   On: 11/14/2019 14:33   DG Shoulder Left Port  Result Date: 11/14/2019 CLINICAL DATA:  Left shoulder pain after fall. EXAM: LEFT SHOULDER COMPARISON:  None. FINDINGS: No acute fracture or dislocation. Old healed left fifth and sixth rib fractures. Mild acromioclavicular degenerative changes with marginal spurring. Osteopenia. Soft tissues are unremarkable. IMPRESSION: 1. No acute osseous abnormality. Electronically Signed   By: Titus Dubin M.D.   On: 11/14/2019 14:31   DG C-Arm 1-60 Min-No Report  Result Date: 11/14/2019 Fluoroscopy was utilized by the requesting physician.  No radiographic interpretation.    ECHOCARDIOGRAM COMPLETE  Result Date: 11/17/2019    ECHOCARDIOGRAM REPORT   Patient Name:   [name] [name] Date of Exam: 11/17/2019 Medical Rec #:  [id number]    Height:       60.0 in Accession #:    [id number]   Weight:       99.2 lb Date of Birth:  1922/10/17    BSA:          1.386 m Patient Age:    89 years     BP:           147/73 mmHg Patient Gender: F            HR:           89 bpm. Exam Location:  Inpatient Procedure: 2D Echo, Cardiac Doppler and Color Doppler Indications:    I48.0 Paroxysmal atrial fibrillation  History:        Patient has no prior history of Echocardiogram examinations.                 Arrythmias:RBBB; Risk Factors:Hypertension and Dyslipidemia.  Sonographer:    Jonelle Sidle Dance Referring Phys: [place]  1. Left ventricular ejection fraction, by estimation, is 60 to 65%. The left ventricle has normal function. The left ventricle has no regional wall motion abnormalities. There is mild left ventricular hypertrophy. Left ventricular diastolic parameters are consistent with Grade I diastolic dysfunction (impaired relaxation).  2.  Right ventricular systolic function is normal. The right ventricular size is normal. There is normal pulmonary artery systolic pressure.  3. The mitral valve is abnormal. Mild mitral valve regurgitation.  4. The aortic valve is abnormal. Aortic valve regurgitation is not visualized. Mild aortic valve sclerosis is present, with no evidence of aortic valve stenosis.  5. The inferior vena cava is normal in size with greater than 50% respiratory variability, suggesting right atrial pressure of 3 mmHg. FINDINGS  Left Ventricle: Left ventricular ejection fraction, by estimation, is 60 to 65%. The left ventricle has normal function. The left ventricle has no regional wall motion abnormalities. The left ventricular internal cavity size was normal in size. There is  mild left ventricular hypertrophy. Left ventricular diastolic parameters are  consistent with Grade I diastolic dysfunction (impaired relaxation). Right Ventricle: The right ventricular size is normal. No increase in right ventricular wall thickness. Right ventricular systolic function is normal. There is normal pulmonary artery systolic pressure. The tricuspid regurgitant velocity is 2.65 m/s, and  with an assumed right atrial pressure of 3 mmHg, the estimated right ventricular systolic pressure is 10.9 mmHg. Left Atrium: Left atrial size was normal in size. Right Atrium: Right atrial size was normal in size. Pericardium: There is no evidence of pericardial effusion. Mitral Valve: The mitral valve is abnormal. Mild mitral annular calcification. Mild mitral valve regurgitation. Tricuspid Valve: The tricuspid valve is grossly normal. Tricuspid valve regurgitation is mild. Aortic Valve: The aortic valve is abnormal. Aortic valve regurgitation is not visualized. Mild aortic valve sclerosis is present, with no evidence of aortic valve stenosis. Pulmonic Valve: The pulmonic valve was not well visualized. Pulmonic valve regurgitation is not visualized. Aorta: The aortic root and ascending aorta are structurally normal, with no evidence of dilitation. Venous: The inferior vena cava is normal in size with greater than 50% respiratory variability, suggesting right atrial pressure of 3 mmHg. IAS/Shunts: The interatrial septum was not assessed.  LEFT VENTRICLE PLAX 2D LVIDd:         3.90 cm LVIDs:         2.60 cm LV PW:         0.90 cm LV IVS:        1.40 cm LVOT diam:     1.80 cm LV SV:         49 LV SV Index:   35 LVOT Area:     2.54 cm  RIGHT VENTRICLE          IVC RV Basal diam:  2.60 cm  IVC diam: 1.40 cm TAPSE (M-mode): 1.8 cm LEFT ATRIUM             Index       RIGHT ATRIUM          Index LA diam:        4.00 cm 2.89 cm/m  RA Area:     7.78 cm LA Vol (A2C):   49.5 ml 35.72 ml/m RA Volume:   11.70 ml 8.44 ml/m LA Vol (A4C):   59.3 ml 42.80 ml/m LA Biplane Vol: 53.9 ml 38.90 ml/m  AORTIC  VALVE LVOT Vmax:   89.65 cm/s LVOT Vmean:  59.200 cm/s LVOT VTI:    0.192 m  AORTA Ao Root diam: 3.10 cm Ao Asc diam:  2.90 cm MITRAL VALVE                TRICUSPID VALVE MV Area (PHT): 3.65 cm     TR Peak grad:  28.1 mmHg MV Decel Time: 208 msec     TR Vmax:        265.00 cm/s MV E velocity: 100.00 cm/s MV A velocity: 98.10 cm/s   SHUNTS MV E/A ratio:  1.02         Systemic VTI:  0.19 m                             Systemic Diam: 1.80 cm Dorris Carnes MD Electronically signed by Dorris Carnes MD Signature Date/Time: 11/17/2019/4:44:49 PM    Final    DG HIP OPERATIVE UNILAT W OR W/O PELVIS LEFT  Result Date: 11/14/2019 CLINICAL DATA:  Elective surgery EXAM: OPERATIVE LEFT HIP (WITH PELVIS IF PERFORMED) 2 VIEWS TECHNIQUE: Fluoroscopic spot image(s) were submitted for interpretation post-operatively. COMPARISON:  11/14/2019 FLUOROSCOPY TIME 0 minutes 10 seconds Dose: 0.8511 mGy Images: 2 FINDINGS: Osseous demineralization. LEFT hip prosthesis. No acute fracture, dislocation, or bone destruction. IMPRESSION: LEFT hip prosthesis without acute complication. Electronically Signed   By: Lavonia Dana M.D.   On: 11/14/2019 13:25   DG Hip Unilat W or Wo Pelvis 2-3 Views Left  Result Date: 11/14/2019 CLINICAL DATA:  Suspected hip fracture patient from home with un witnessed fall complaining of severe LEFT hip pain EXAM: DG HIP (WITH OR WITHOUT PELVIS) 2-3V LEFT COMPARISON:  May 13, 2018 FINDINGS: Signs of displaced and angulated fracture with varus deformity of the LEFT femoral neck. Superior migration of the LEFT femur. Femoral head remains within the acetabulum. Evidence of prior RIGHT femoral ORIF. Some anterior displacement of distal femur relative to femoral head on the cross-table lateral. Evidence of fracture, previous fracture of the RIGHT inferior pubic ramus. IMPRESSION: 1. Signs of displaced and angulated fracture of the LEFT femoral neck. 2. Prior RIGHT femoral ORIF. 3. Signs of prior fracture of the RIGHT  inferior pubic ramus. Electronically Signed   By: Zetta Bills M.D.   On: 11/14/2019 07:52   DG ESOPHAGUS W SINGLE CM (SOL OR THIN BA)  Result Date: 11/18/2019 CLINICAL DATA:  Feeling of food sticking in esophagus. EXAM: ESOPHOGRAM/BARIUM SWALLOW TECHNIQUE: Single contrast examination was performed using  thin barium. FLUOROSCOPY TIME:  Fluoroscopy Time:  1 minutes 12 seconds Radiation Exposure Index (if provided by the fluoroscopic device): 12 mGy Number of Acquired Spot Images: 7 COMPARISON:  Esophagram 07/10/2014 FINDINGS: Exam is limited due to patient's recent open reduction internal fixation a hip fracture. Oral contrast flowed readily through the esophagus into the stomach. The distal esophagus is extremely tortuous. This finding is similar to but slightly progressed from comparison exam 2016. There is a moderate size hiatal hernia. No high-grade obstruction.  No reflux demonstrated. IMPRESSION: 1. Extremely tortuous distal esophagus presumably accounts for feeling of slow passage of food in the esophagus. Recommend small portion instructions. 2. No high-grade obstruction. 3. Moderate hiatal hernia. 4. Findings similar to but mildly progressed from esophagram of 2016. Electronically Signed   By: Suzy Bouchard M.D.   On: 11/18/2019 14:50    Assessment/Plan 1. Weight loss Has had poor oral intake related to esophageal dysphagia.recent COVID-19 positive isolation and depression could be contributing. - Has declined feeding tube - continue with current protein supplement and encourage oral intake  - start on Remeron 7.5 mg tablet daily at bedtime for appetite stimulant   2. Current moderate episode of major depressive disorder, unspecified whether recurrent ([place]) Mood despair.No suicidal thought or injury to self.though did mention did not  want feeding tube somebody to be a gun " pointed on her head".  - start Remeron 7.5 mg tablet daily then increase to 15 mg tablet if needed in one week.   - continue to monitor.  Family/ staff Communication: Reviewed plan of care with patient,Facility Nurse and Registered Dietician.  Labs/tests ordered: None

## 2019-12-12 ENCOUNTER — Ambulatory Visit: Payer: Medicare Other | Admitting: Nurse Practitioner

## 2019-12-15 DIAGNOSIS — S72032D Displaced midcervical fracture of left femur, subsequent encounter for closed fracture with routine healing: Secondary | ICD-10-CM | POA: Diagnosis not present

## 2019-12-20 ENCOUNTER — Other Ambulatory Visit: Payer: Self-pay | Admitting: *Deleted

## 2019-12-20 ENCOUNTER — Non-Acute Institutional Stay (SKILLED_NURSING_FACILITY): Payer: Medicare Other | Admitting: Internal Medicine

## 2019-12-20 ENCOUNTER — Encounter: Payer: Self-pay | Admitting: Internal Medicine

## 2019-12-20 DIAGNOSIS — G3184 Mild cognitive impairment, so stated: Secondary | ICD-10-CM

## 2019-12-20 DIAGNOSIS — R634 Abnormal weight loss: Secondary | ICD-10-CM | POA: Diagnosis not present

## 2019-12-20 DIAGNOSIS — F321 Major depressive disorder, single episode, moderate: Secondary | ICD-10-CM

## 2019-12-20 DIAGNOSIS — Z7189 Other specified counseling: Secondary | ICD-10-CM | POA: Diagnosis not present

## 2019-12-20 DIAGNOSIS — K224 Dyskinesia of esophagus: Secondary | ICD-10-CM

## 2019-12-20 NOTE — Progress Notes (Signed)
Location:  [place] Room Number: [place] of Service:  SNF (31) Provider:  Iraida Cragin L. Mariea Clonts, D.O., C.M.D.  Lajean Manes, MD  Patient Care Team: Lajean Manes, MD as PCP - General (Internal Medicine) Jerline Pain, MD as PCP - Cardiology (Cardiology)  Extended Emergency Contact Information Primary Emergency Contact: Justice,Jane Work Phone: [phone] Mobile Phone: [phone] Relation: Other Preferred language: English Interpreter needed? No Secondary Emergency Contact: [place] [employer] Phone: [phone] Relation: Relative  Code Status:  DNR Goals of care: Advanced Directive information Advanced Directives 12/20/2019  Does Patient Have a Medical Advance Directive? Yes  Type of Advance Directive Out of facility DNR (pink MOST or yellow form)  Does patient want to make changes to medical advance directive? No - Patient declined  Would patient like information on creating a medical advance directive? -  Pre-existing out of facility DNR order (yellow form or pink MOST form) -     Chief Complaint  Patient presents with  . Acute Visit    weight loss, dysphagia, depression  . Advance Care Planning    MOST form    HPI:  Pt is a 86 y.o. female seen today for an acute visit to establish goals of care and discuss her mood, weight loss and dysphagia due to achalasia/tortuous esophagus.  Per my admission H+P, patient has had workup for her dysphagia--not sure what GI would do.  "As far as her poor intake and malnutrition, she reported dysphagia and food sticking and burping.  She had a barium esophagram with a tortuous distal esophagus with slow food passage.  No obstruction.  She did have a moderate hiatal hernia.  The tortuosity was mildly progressed vs 2016.  ST evaluated her and she was sent to [facility] on a dysphagia 3 diet and will be followed here by ST."  My assessment and plan indicated "-to follow  speech pathology recommendations for postural adjustments, sips of water after bites, and continuing soft foods, may try puree, but often this is not appealing"  Pt was seen by NP on 9/9 for her weight loss--ST had indicated that pt was vomiting during meals and unable to swallow.  She had lost 6.6 lbs over one week.  At that time, she was on isolation for testing positive for covid also.  She does not want a feeding tube.  She admitted to depression then.  She only took 2 bites of pudding mixed with crushed med and did not eat more of it.  NP started her on remeron due to depression at that time--7.47m and to be increased to 135mafter one week.    I asked if a family meeting could be set up for this afternoon.  Meeting wound up being held at 11am this morning so I could not attend.  I met with the resident herself this afternoon.  She reports that she is eager to go home.  She is ok with dying at home.  She does not want to die in a facility or hospital.  She requests comfort care.  She keeps saying that the swallowing problem is not a big deal and only bothers her occasionally.  She does not like eating on the whole and does not get excited about most foods.  She ate a lot of soups at home and looks forward to trying some of those again when she gets there.  She thinks she did have her esophagus dilated at one  time.  She is not up for that at this time.    We reviewed and completed the MOST form.  She definitely does not want CPR and did not even let me finish my usual discussion on that when she said she absolutely does not want it.  She says if that happens, it's time for her to go.  She says she's had a good life.  She does not want to die necessarily, but she's ready if it's her time.  She does not want to be hospitalized if she can be kept comfortable in her home environment.  She is ok with getting antibiotics or fluids for a short time IF they will improve her condition at the time.  She absolutely  does NOT want a feeding tube as mentioned before.  She is aware that this form should be kept on her refrigerator in case of home emergency.  I spoke with some of the staff members in attendance at the meeting this morning (DON and ED) and there are some plans to consult with [facility] to provide ongoing care when she is discharged home.    I had also spoken with Mrs. Duba about palliative care at home and possible home health services.  She liked this idea.   She just does not want to be in an institution.  I left a message with Opal Sidles, her daughter-in-law and Chauncey Reading, to return my call.  She did call me back and we spoke for another 10 minutes about the MOST and Mrs. Revels's perspectives.    Past Medical History:  Diagnosis Date  . Arthritis   . Hip fracture [facility]) 2009   surgery  . History of depression   . History of gallstones   . History of IBS   . History of vertigo   . Hyperlipidemia   . Hypertension   . RBBB (right bundle branch block)    Past Surgical History:  Procedure Laterality Date  . CHOLECYSTECTOMY  2008   Dr. Harlow Asa  . [place]   hysterectomy  . TONSILECTOMY, ADENOIDECTOMY, BILATERAL MYRINGOTOMY AND TUBES     age 29 tonsils and adnoids only  . TOTAL HIP ARTHROPLASTY  02/20/2008   fractured hip  . TOTAL HIP ARTHROPLASTY Left 11/14/2019   Procedure: LEFT HIP HEMIARTHROPLASTY ANTERIOR APPROACH;  Surgeon: Rod Can, MD;  Location: [facility] ORS;  Service: Orthopedics;  Laterality: Left;    Allergies  Allergen Reactions  . Codeine Nausea And Vomiting  . Crestor [Rosuvastatin Calcium]     myalgias  . Escitalopram Oxalate     Abdominal problem  . Ezetimibe-Simvastatin     myalgias    Outpatient Encounter Medications as of 12/20/2019  Medication Sig  . acetaminophen (TYLENOL) 500 MG tablet Take 500 mg by mouth every 6 (six) hours as needed for moderate pain.  Marland Kitchen apixaban (ELIQUIS) 2.5 MG TABS tablet Take 1 tablet (2.5 mg total) by mouth 2 (two) times  daily.  Marland Kitchen BISACODYL LAXATIVE RE Place 10 mg rectally as needed.  . docusate sodium (COLACE) 100 MG capsule Take 1 capsule (100 mg total) by mouth 2 (two) times daily.  . iron polysaccharides (NU-IRON) 150 MG capsule Take 1 capsule (150 mg total) by mouth daily.  . Magnesium Hydroxide (MILK OF MAGNESIA PO) Take 30 mLs by mouth.  . melatonin 3 MG TABS tablet Take 1 tablet (3 mg total) by mouth at bedtime as needed (INSOMNIA).  Marland Kitchen metoprolol tartrate (LOPRESSOR) 25 MG tablet Take 1 tablet (  25 mg total) by mouth 2 (two) times daily.  . mirtazapine (REMERON) 15 MG tablet Take 15 mg by mouth at bedtime. Take 1/2 tablet &7.5 mg)po daily at bedtimes for appetite/depression/weight loss  . ondansetron (ZOFRAN) 4 MG tablet Take 4 mg by mouth. administer 1 tab PO before meals and at bedtime  . pantoprazole (PROTONIX) 40 MG tablet Take 1 tablet (40 mg total) by mouth daily at 6 (six) AM.  . polyethylene glycol (MIRALAX / GLYCOLAX) 17 g packet Take 17 g by mouth daily.  . Sodium Phosphates (RA SALINE ENEMA RE) Place rectally as needed.  . vitamin B-12 (CYANOCOBALAMIN) 1000 MCG tablet Take 1 tablet (1,000 mcg total) by mouth daily.  . [DISCONTINUED] ondansetron (ZOFRAN) 4 MG tablet Take 4 mg by mouth 2 (two) times daily as needed for nausea/vomiting.  . [DISCONTINUED] diltiazem (CARDIZEM CD) 120 MG 24 hr capsule Take 1 capsule (120 mg total) by mouth daily. (Patient not taking: Reported on 05/13/2018)  . [DISCONTINUED] esomeprazole (NEXIUM) 40 MG capsule Take 1 capsule (40 mg total) by mouth 2 (two) times daily before a meal. (Patient not taking: Reported on 05/13/2018)  . [DISCONTINUED] metoCLOPramide (REGLAN) 5 MG tablet Take 1 tablet (5 mg total) by mouth 3 (three) times daily with meals as needed for nausea (take 15mns before meals). (Patient not taking: Reported on 05/13/2018)  . [DISCONTINUED] promethazine (PHENERGAN) 12.5 MG tablet Take 1 tablet (12.5 mg total) by mouth every 6 (six) hours as needed for nausea  or vomiting. (Patient not taking: Reported on 05/13/2018)   No facility-administered encounter medications on file as of 12/20/2019.    Review of Systems  Constitutional: Positive for weight loss. Negative for chills and fever.  HENT: Negative for congestion and sore throat.   Eyes: Negative for blurred vision.  Respiratory: Negative for cough and shortness of breath.        Had tested positive for covid with quarantine period but not symptomatic  Cardiovascular: Negative for chest pain.  Gastrointestinal: Negative for abdominal pain.  Genitourinary: Negative for dysuria.  Musculoskeletal: Positive for falls, joint pain and neck pain.  Neurological: Negative for dizziness and loss of consciousness.  Endo/Heme/Allergies: Bruises/bleeds easily.  Psychiatric/Behavioral: Positive for depression and memory loss. The patient is nervous/anxious.     Immunization History  Administered Date(s) Administered  . Influenza, High Dose Seasonal PF 01/05/2018, 12/23/2018  . Tdap 11/14/2019  . Unspecified SARS-COV-2 Vaccination 04/01/2019, 05/02/2019   Pertinent  Health Maintenance Due  Topic Date Due  . DEXA SCAN  Never done  . PNA vac Low Risk Adult (1 of 2 - PCV13) Never done  . INFLUENZA VACCINE  10/30/2019   No flowsheet data found. Functional Status Survey:    Vitals:   12/20/19 1532  BP: 114/61  Pulse: 97  Temp: (!) 96.9 F (36.1 C)  Weight: 100 lb (45.4 kg)  Height: 5' (1.524 m)   Body mass index is 19.53 kg/m. Physical Exam Constitutional:      General: She is not in acute distress.    Comments: Frail female sitting in recliner chair in her snf room  HENT:     Head: Normocephalic and atraumatic.  Eyes:     Extraocular Movements: Extraocular movements intact.     Pupils: Pupils are equal, round, and reactive to light.  Cardiovascular:     Rate and Rhythm: Normal rate and regular rhythm.     Pulses: Normal pulses.     Heart sounds: Normal heart sounds.  Pulmonary:  Effort: Pulmonary effort is normal.     Breath sounds: Normal breath sounds.  Abdominal:     General: Bowel sounds are normal.     Palpations: Abdomen is soft.     Tenderness: There is no abdominal tenderness.  Musculoskeletal:        General: Normal range of motion.     Cervical back: Neck supple.     Right lower leg: No edema.     Left lower leg: No edema.  Skin:    General: Skin is warm and dry.  Neurological:     General: No focal deficit present.     Mental Status: She is alert.     Cranial Nerves: No cranial nerve deficit.     Motor: Weakness present.     Gait: Gait abnormal.     Comments: Was forgetting that I was not at the meeting this am, but able to understand her choices on the MOST form and their outcomes, repercussions; using walker  Psychiatric:        Mood and Affect: Mood normal.        Behavior: Behavior normal.        Thought Content: Thought content normal.        Judgment: Judgment normal.     Comments: Tearful at times     Labs reviewed: Recent Labs    11/17/19 0302 11/17/19 0819 11/18/19 0516 11/19/19 0602 11/20/19 0518 11/20/19 0518 11/21/19 0600 11/22/19 0554 11/29/19 0000  NA   < >  --  138   < > 137   < > 142 139 133*  K   < >  --  3.5   < > 3.5   < > 4.1 4.7 4.4  CL   < >  --  107   < > 106   < > 105 102 98*  CO2   < >  --  21*   < > 22   < > '25 26 21  ' GLUCOSE   < >  --  91   < > 98  --  97 106*  --   BUN   < >  --  12   < > 11   < > '10 11 8  ' CREATININE   < >  --  0.42*   < > 0.52   < > 0.54 0.65 0.5  CALCIUM   < >  --  8.7*   < > 8.8*   < > 9.5 9.7 9.2  MG  --  2.2 2.0  --   --   --   --  2.2  --    < > = values in this interval not displayed.   No results for input(s): AST, ALT, ALKPHOS, BILITOT, PROT, ALBUMIN in the last 8760 hours. Recent Labs    11/14/19 0655 11/15/19 0320 11/17/19 0302 11/17/19 0302 11/18/19 0516 11/18/19 0516 11/19/19 0602 11/19/19 0602 11/20/19 0518 11/21/19 0600 11/29/19 0000  WBC 10.7*   < > 9.1    < > 7.9  --  9.0  --   --   --  4.1  NEUTROABS 9.1*  --   --   --   --   --   --   --   --   --   --   HGB 13.1   < > 9.7*   < > 9.0*   < > 10.5*   < > 9.0* 9.9* 10.1*  HCT 40.8   < >  30.5*   < > 28.2*   < > 33.1*   < > 27.9* 32.5* 31*  MCV 91.7   < > 92.1  --  91.3  --  92.7  --   --   --   --   PLT 272   < > 250   < > 298  --  373  --   --   --  457*   < > = values in this interval not displayed.   Lab Results  Component Value Date   TSH 1.810 11/17/2019   Lab Results  Component Value Date   HGBA1C (H) 01/27/2010    5.9 (NOTE)                                                                       According to the ADA Clinical Practice Recommendations for 2011, when HbA1c is used as a screening test:   >=6.5%   Diagnostic of Diabetes Mellitus           (if abnormal result  is confirmed)  5.7-6.4%   Increased risk of developing Diabetes Mellitus  References:Diagnosis and Classification of Diabetes Mellitus,Diabetes WERX,5400,86(PYPPJ 1):S62-S69 and Standards of Medical Care in         Diabetes - 2011,Diabetes KDTO,6712,45  (Suppl 1):S11-S61.   Lab Results  Component Value Date   CHOL  12/17/2006    127        ATP III CLASSIFICATION:  <200     mg/dL   Desirable  200-239  mg/dL   Borderline High  >=240    mg/dL   High   HDL 47 12/17/2006   LDLCALC  12/17/2006    65        Total Cholesterol/HDL:CHD Risk Coronary Heart Disease Risk Table                     Men   Women  1/2 Average Risk   3.4   3.3   TRIG 75 12/17/2006   CHOLHDL 2.7 12/17/2006   Assessment/Plan 1. Weight loss -weights reviewed since admission and down to 88.2 lbs as of 9/16 -previously 91.8 lbs on 9/8, 98.4 lbs 9/1 and 100.8 lbs on 8/25 at admission -multifactorial:  Esophageal dysmotility, depression related to environmental change, lack of interest in food/motivation to eat, recent covid infection (no other symptoms) -may do better when returns home but difficult to predict for certain--she is looking forward  to soups she can get outside of here  2. Esophageal dysmotility -not a good candidate a her advanced age and degree of frailty for an endoscopy with dilation, FEES could be done here for a better look at esophagus, but really won't end up changing outcome  -cont positional adjustments, following small bites of soft and pureed items with liquids per ST  3. Current moderate episode of major depressive disorder, unspecified whether recurrent ([place]) -remains on remeron for antidepressant property but outlook still poor if she were to have to stay in SNF--she's eager for d/c home   4. Mild cognitive impairment -had some repetitive questions but certainly has insight into her condition and capacity to make decisions for herself based on those established criteria  5. ACP (advance care planning) -19  total minutes spent on ACP discussing with pt, staff, completing MOST and reaching out to family -MOST form as follows:  DNR, comfort measures, determine abx use of limitation when infection occurs, defined trial of fluids if indicated, NO feeding tube -attempted to reach Jane by phone (likely working after spending time here this am)--spoke with Opal Sidles x 10 mins--MOST to be scanned/uploaded to [facility] at [facility] and original to go home with pt to be placed in her freezer with her other ACP docs -pt's goal is to go home and to eventually pass away at home  Family/ staff Communication: discussed with DON, ED, called family and left message  Labs/tests ordered:  No new  Ayanah Snader L. Tandra Rosado, D.O. [facility] [address], [place] [address] Cell Phone (Mon-Fri 8am-5pm):  [phone] On Call:  [phone] & follow prompts after 5pm & weekends Office Phone:  [phone] Office Fax:  [phone]

## 2019-12-20 NOTE — Patient Outreach (Signed)
Member screened for potential [facility] Care Management needs as a benefit of [place] [employer].  Verified in Patient [place] that member resides in [facility].   Communication sent to [facility] SW to collaborate about transition plans and potential [facility] Care Management services.  Marthenia Rolling, MSN-Ed, RN,BSN [facility] Acute Care Coordinator [phone] [facility]) [phone]  (Toll free office)

## 2019-12-26 NOTE — Progress Notes (Deleted)
CARDIOLOGY OFFICE NOTE  Date:  12/26/2019    [name] Date of Birth: Oct 16, 1922 Medical Record #[id number]  PCP:  Lajean Manes, MD  Cardiologist:  Marlou Porch ([facility])   No chief complaint on file.   History of Present Illness: [name] [name] is a 86 y.o. female who presents today for a ***  medical history of right hip fracture status post repair more than 10 years ago, hypertension hyperlipidemia lives at her house with assistance she relates she thinks she tripped and fell and landed on her left hip after that she started having severe left-sided pain worse with movement. She denies any prodromal symptoms. Has any chest pain shortness of breath nausea vomiting,diarrhea or fever.  In the ED: Found tachycardic normotensive satting 100% on room air, the medic with a white count of 10.7 with a left shift a left hip displaced fracture.  Hospital Course:  1 left hip fracture status post left hip hemiarthroplasty 11/14/2019 per Dr.Swinteck Secondary to mechanical fall. Patient seen in consultation by orthopedics.  Patient subsequently underwent left hip hemiarthroplasty 11/14/2019 without any complications.  Patient initially placed on scheduled Tylenol, Norco as well as IV morphine as needed for severe pain.  Patient seen by PT OT who recommended SNF placement.  Orthopedics recommended Eliquis on discharge for DVT prophylaxis.  Outpatient follow-up with orthopedics.    2. New onset A. Fib/? Paroxysmal atrial tachycardia versus SVT Patient noted to go into atrial fibrillation early on during the hospitalization. Patient with no prior history of atrial fibrillation. Cardiac enzymes obtained negative. 2D echo ordered and EF of 60 to 65%, no wall motion abnormalities, grade 1 diastolic dysfunction, mild LVH, mild MVR, left atrial size is normal.. Patient seen in consultation by cardiology and patient started on low-dose beta-blocker to prevent patient from going back into A. fib  with RVR. Patient noted to have some bursts of SVT and what seems like paroxysmal atrial tachycardia per cardiology and as such beta-blocker dose increased. Patient noted to have a 20 beat run of SVT the evening of 11/19/2018. Lopressor dose given early. No further runs of SVT noted. Cardiology not recommending anticoagulation at this time given short nature of patient's A. fib and inciting event of hip fracture. Patient was on aspirin for DVT prophylaxis per orthopedics for hip surgery. Orthopedics recommended Eliquis on discharge for DVT prophylaxis.  Outpatient follow-up with cardiology.  3. Hypertension Patient noted to be soft early on during the hospitalization. Norvasc discontinued. Patient started on beta-blocker secondary to A. fib/paroxysmal atrial tachycardia.   Patient will be discharged on beta-blocker.  Outpatient follow-up.    4. QTc prolongation Repeat EKG with resolution of QTC prolongation.  5. Hypokalemia Repleted. Potassium at 4.7 by day of discharge. Patient noted to have some runs of SVT on 11/19/2019. Magnesium was kept at 2. Patient with new onset A. fib versus paroxysmal atrial tachycardia in the/potassium was kept around 4 and magnesium at 2..  Patient followed by cardiology.  6. Dehydration Hydrated with IV fluids.   Patient was euvolemic by day of discharge.   7. Reactive leukocytosis Resolved.  8. Postop acute blood loss anemia/anemia of chronic disease/vitamin B12 deficiency. Patient with no overt bleeding. Hemoglobin stabilized at 9.9. Anemia panel with iron of 14, TIBC of 228, ferritin of 187, folate of 8.9, vitamin B12 of 159.   Patient was started on vitamin B12 1000 MCG subcutaneous daily during the hospitalization will be discharged on oral vitamin B12 supplementation.  Patient status  post IV Feraheme during the hospitalization.  Patient will be discharged on oral iron supplementation.  Outpatient follow-up.  Marland Kitchen  9. Dysphagia  secondary to tortuous esophagus Patient with complaints of difficulty swallowing feels like food may be getting stuck however patient also with significant burping. Improving clinically. Patient underwent a barium esophagram that showed an extremely tortuous distal esophagus presumably accounting for slow passage of food in the esophagus, no high-grade obstruction, moderate hiatal hernia, findings similar but mildly progressed from esophagram in 2016. Diet modifications made per speech therapy.  Patient was discharged on a dysphagia 3 diet.  Patient will need speech therapy to follow her her at skilled nursing facility.  Outpatient follow-up.   Procedures:  Plain films of the left shoulder 11/14/2019  Plain films of the pelvis 11/14/2019  Chest x-ray 11/14/2019  Plain films of the left hip and pelvis 11/14/2019  Plain films of the right tib-fib 11/14/2019  Plain films of the pelvis 11/14/2019  Left hip hemiarthroplasty per Dr. Lyla Glassing 11/14/2019  2D echo 11/17/2019  Barium esophagram 11/18/2019  Consultations:  Orthopedics: Dr. Lyla Glassing 11/14/2019  Palliative care: Vinie Sill, NP 8/54/6270  Cardiology: Dr. Marlou Porch 11/17/2019  Comes in today. Here with   Past Medical History:  Diagnosis Date  . Arthritis   . Hip fracture [facility]) 2009   surgery  . History of depression   . History of gallstones   . History of IBS   . History of vertigo   . Hyperlipidemia   . Hypertension   . RBBB (right bundle branch block)     Past Surgical History:  Procedure Laterality Date  . CHOLECYSTECTOMY  2008   Dr. Harlow Asa  . [place]   hysterectomy  . TONSILECTOMY, ADENOIDECTOMY, BILATERAL MYRINGOTOMY AND TUBES     age 63 tonsils and adnoids only  . TOTAL HIP ARTHROPLASTY  02/20/2008   fractured hip  . TOTAL HIP ARTHROPLASTY Left 11/14/2019   Procedure: LEFT HIP HEMIARTHROPLASTY ANTERIOR APPROACH;  Surgeon: Rod Can, MD;  Location: [facility];  Service:  Orthopedics;  Laterality: Left;     Medications: No outpatient medications have been marked as taking for the 01/04/20 encounter (Appointment) with Burtis Junes, NP.     Allergies: Allergies  Allergen Reactions  . Codeine Nausea And Vomiting  . Crestor [Rosuvastatin Calcium]     myalgias  . Escitalopram Oxalate     Abdominal problem  . Ezetimibe-Simvastatin     myalgias    Social History: The patient  reports that she has never smoked. She has never used smokeless tobacco. She reports that she does not drink alcohol and does not use drugs.   Family History: The patient's ***family history includes Colon cancer in her sister; Heart disease in her brother; Kidney disease in her brother; Other in her father; Tuberculosis in her mother.   Review of Systems: Please see the history of present illness.   All other systems are reviewed and negative.   Physical Exam: VS:  There were no vitals taken for this visit. Marland Kitchen  BMI There is no height or weight on file to calculate BMI.  Wt Readings from Last 3 Encounters:  12/20/19 100 lb (45.4 kg)  12/08/19 100 lb (45.4 kg)  11/25/19 100 lb 12.8 oz (45.7 kg)    General: Pleasant. Well developed, well nourished and in no acute distress.   HEENT: Normal.  Neck: Supple, no JVD, carotid bruits, or masses noted.  Cardiac: ***Regular rate and rhythm. No murmurs, rubs,  or gallops. No edema.  Respiratory:  Lungs are clear to auscultation bilaterally with normal work of breathing.  GI: Soft and nontender.  MS: No deformity or atrophy. Gait and ROM intact.  Skin: Warm and dry. Color is normal.  Neuro:  Strength and sensation are intact and no gross focal deficits noted.  Psych: Alert, appropriate and with normal affect.   LABORATORY DATA:  EKG:  EKG {ACTION; IS/IS [id number]} ordered today.  Personally reviewed by me. This demonstrates ***.  Lab Results  Component Value Date   WBC 4.1 11/29/2019   HGB 10.1 (A) 11/29/2019   HCT 31  (A) 11/29/2019   PLT 457 (A) 11/29/2019   GLUCOSE 106 (H) 11/22/2019   CHOL  12/17/2006    127        ATP III CLASSIFICATION:  <200     mg/dL   Desirable  200-239  mg/dL   Borderline High  >=240    mg/dL   High   TRIG 75 12/17/2006   HDL 47 12/17/2006   LDLCALC  12/17/2006    65        Total Cholesterol/HDL:CHD Risk Coronary Heart Disease Risk Table                     Men   Women  1/2 Average Risk   3.4   3.3   ALT 18 07/07/2014   AST 22 07/07/2014   NA 133 (A) 11/29/2019   K 4.4 11/29/2019   CL 98 (A) 11/29/2019   CREATININE 0.5 11/29/2019   BUN 8 11/29/2019   CO2 21 11/29/2019   TSH 1.810 11/17/2019   INR 1.05 01/26/2010   HGBA1C (H) 01/27/2010    5.9 (NOTE)                                                                       According to the ADA Clinical Practice Recommendations for 2011, when HbA1c is used as a screening test:   >=6.5%   Diagnostic of Diabetes Mellitus           (if abnormal result  is confirmed)  5.7-6.4%   Increased risk of developing Diabetes Mellitus  References:Diagnosis and Classification of Diabetes Mellitus,Diabetes HKVQ,2595,63(OVFIE 1):S62-S69 and Standards of Medical Care in         Diabetes - 2011,Diabetes Care,2011,34  (Suppl 1):S11-S61.     BNP (last 3 results) No results for input(s): BNP in the last 8760 hours.  ProBNP (last 3 results) No results for input(s): PROBNP in the last 8760 hours.   Other Studies Reviewed Today:   Assessment/Plan:   Assessment and Plan:   1. A fib with RVR new onset.  + chest pain associated with rapid HR and neg troponin, no ST changes on EKG.  Echo ordered, will see EF  And WMA,  With her age most likely CAD, but unless recurrent pain would hold on ischemic eval.    cha2DS2VAsc 4, with age, HTN female -- though short episodes of PAF.  Will add BB for now to prevent more episodes with thought to BB vs dilt at discharge.  Dr. Marlou Porch to see. 2. fx Lt hip with Lefthip hemiarthroplasty, anterior  approach.  POD # 3 per ortho  3. HTN was on amlodipine prior to admit held for lower BP but has improved.  Will do BB for now.      Current medicines are reviewed with the patient today.  The patient does not have concerns regarding medicines other than what has been noted above.  The following changes have been made:  See above.  Labs/ tests ordered today include:   No orders of the defined types were placed in this encounter.    Disposition:   FU with *** in {gen number [id number]} {Days to years:10300}.   Patient is agreeable to this plan and will call if any problems develop in the interim.   SignedTruitt Merle, NP  12/26/2019 7:13 AM  [facility] [address] [place], [place]  [address] Phone: [phone] Fax: [phone]

## 2019-12-29 ENCOUNTER — Encounter: Payer: Self-pay | Admitting: Family

## 2019-12-29 ENCOUNTER — Non-Acute Institutional Stay (SKILLED_NURSING_FACILITY): Payer: Medicare Other | Admitting: Family

## 2019-12-29 DIAGNOSIS — R634 Abnormal weight loss: Secondary | ICD-10-CM

## 2019-12-29 DIAGNOSIS — F321 Major depressive disorder, single episode, moderate: Secondary | ICD-10-CM | POA: Diagnosis not present

## 2019-12-29 DIAGNOSIS — I10 Essential (primary) hypertension: Secondary | ICD-10-CM

## 2019-12-29 DIAGNOSIS — G3184 Mild cognitive impairment, so stated: Secondary | ICD-10-CM | POA: Diagnosis not present

## 2019-12-29 NOTE — Progress Notes (Signed)
Location:    [facility].   Nursing Home Room Number: 364-W Place of Service:  SNF (31) Provider:  Marlowe Sax, NP   Patient Care Team: Lajean Manes, MD as PCP - General (Internal Medicine) Jerline Pain, MD as PCP - Cardiology (Cardiology)  Extended Emergency Contact Information Primary Emergency Contact: Justice,Jane Work Phone: [phone] Mobile Phone: [phone] Relation: Other Preferred language: English Interpreter needed? No Secondary Emergency Contact: [employer] Phone: [phone] Relation: Relative  Code Status:  DNR Goals of care: Advanced Directive information Advanced Directives 12/29/2019  Does Patient Have a Medical Advance Directive? Yes  Type of Advance Directive Out of facility DNR (pink MOST or yellow form)  Does patient want to make changes to medical advance directive? No - Patient declined  Would patient like information on creating a medical advance directive? -  Pre-existing out of facility DNR order (yellow form or pink MOST form) -     Chief Complaint  Patient presents with  . Medical Management of Chronic Issues    Routine Visit.  Marland Kitchen Health Maintenance    Discuss the need for Dexa Scan.  . Immunizations    Discuss the need for PNA Vaccine, and Influenza Vaccine.    HPI:  Pt is a 86 y.o. female seen today for medical management of chronic diseases.she has a medical history of Hypertension, Hyperlipidemia,Right Bundle branch block,vertigo,Depression,Osteoarthrtis,Dysphagia,Hiatal hernia among other conditions.she is seen in her room today sitting on recliner.she states feeling much happier today after getting out of the isolation since she tested positive for COVID-19.she states continues to have some feeling like " something is stuck in the mid chest when she swallows.Not spitting or cough as much since last visit on 12/08/2019. She had weight loss but weight has been stable for the past 6  days  Weight 88.2 lbs ( 12/15/2019 ) now weight 88.6 lbs ( 12/21/2019). Facility Nurse reports slid of wheelchair after she tried to transfer self before staff could assist her on 12/27/2019 no acute injuries sustained.denies any pain. Of note,she is here for short term rehab post hospital admission 11/14/2019 - 11/22/2019 with a fall onto left hip after tripping status post left hip hemiarthroplasty on 11/14/2019 by Dr.Swinteck.  Past Medical History:  Diagnosis Date  . Arthritis   . Hip fracture [facility]) 2009   surgery  . History of depression   . History of gallstones   . History of IBS   . History of vertigo   . Hyperlipidemia   . Hypertension   . RBBB (right bundle branch block)    Past Surgical History:  Procedure Laterality Date  . CHOLECYSTECTOMY  2008   Dr. Harlow Asa  . [place]   hysterectomy  . TONSILECTOMY, ADENOIDECTOMY, BILATERAL MYRINGOTOMY AND TUBES     age 6 tonsils and adnoids only  . TOTAL HIP ARTHROPLASTY  02/20/2008   fractured hip  . TOTAL HIP ARTHROPLASTY Left 11/14/2019   Procedure: LEFT HIP HEMIARTHROPLASTY ANTERIOR APPROACH;  Surgeon: Rod Can, MD;  Location: [facility];  Service: Orthopedics;  Laterality: Left;    Allergies  Allergen Reactions  . Codeine Nausea And Vomiting  . Crestor [Rosuvastatin Calcium]     myalgias  . Escitalopram Oxalate     Abdominal problem  . Ezetimibe-Simvastatin     myalgias    Allergies as of 12/29/2019      Reactions   Codeine Nausea And Vomiting   Crestor [rosuvastatin Calcium]  myalgias   Escitalopram Oxalate    Abdominal problem   Ezetimibe-simvastatin    myalgias      Medication List       Accurate as of December 29, 2019  3:00 PM. If you have any questions, ask your nurse or doctor.        acetaminophen 500 MG tablet Commonly known as: TYLENOL Take 500 mg by mouth every 6 (six) hours as needed for moderate pain.   apixaban 2.5 MG Tabs tablet Commonly known as: ELIQUIS Take 1  tablet (2.5 mg total) by mouth 2 (two) times daily.   BISACODYL LAXATIVE RE Place 10 mg rectally as needed.   docusate sodium 100 MG capsule Commonly known as: COLACE Take 1 capsule (100 mg total) by mouth 2 (two) times daily.   iron polysaccharides 150 MG capsule Commonly known as: Nu-Iron Take 1 capsule (150 mg total) by mouth daily.   melatonin 3 MG Tabs tablet Take 1 tablet (3 mg total) by mouth at bedtime as needed (INSOMNIA).   metoprolol tartrate 25 MG tablet Commonly known as: LOPRESSOR Take 1 tablet (25 mg total) by mouth 2 (two) times daily.   MILK OF MAGNESIA PO Take 30 mLs by mouth.   mirtazapine 15 MG tablet Commonly known as: REMERON Take 7.5 mg by mouth at bedtime. Take 1/2 tablet &7.5 mg)po daily at bedtimes for appetite/depression/weight loss   ondansetron 4 MG tablet Commonly known as: ZOFRAN Take 4 mg by mouth. administer 1 tab PO before meals and at bedtime   pantoprazole 40 MG tablet Commonly known as: PROTONIX Take 1 tablet (40 mg total) by mouth daily at 6 (six) AM.   polyethylene glycol 17 g packet Commonly known as: MIRALAX / GLYCOLAX Take 17 g by mouth daily.   RA SALINE ENEMA RE Place rectally as needed.   vitamin B-12 1000 MCG tablet Commonly known as: CYANOCOBALAMIN Take 1 tablet (1,000 mcg total) by mouth daily.       Review of Systems  Constitutional: Positive for unexpected weight change. Negative for appetite change, chills, fatigue and fever.       Wt stable this visit   HENT: Positive for hearing loss and trouble swallowing. Negative for congestion, rhinorrhea, sinus pressure, sinus pain, sneezing and sore throat.   Eyes: Negative for discharge, redness and itching.  Respiratory: Negative for cough, chest tightness, shortness of breath and wheezing.   Cardiovascular: Negative for chest pain, palpitations and leg swelling.  Gastrointestinal: Negative for abdominal distention, abdominal pain, constipation, diarrhea, nausea and  vomiting.  Endocrine: Negative for cold intolerance, heat intolerance, polydipsia, polyphagia and polyuria.  Genitourinary: Negative for difficulty urinating, dysuria, flank pain, frequency and urgency.  Musculoskeletal: Positive for arthralgias and gait problem. Negative for joint swelling and myalgias.  Skin: Negative for color change, pallor, rash and wound.  Neurological: Negative for dizziness, speech difficulty, weakness, light-headedness and headaches.  Hematological: Does not bruise/bleed easily.  Psychiatric/Behavioral: Positive for confusion. Negative for agitation, behavioral problems and sleep disturbance. The patient is not nervous/anxious.     Immunization History  Administered Date(s) Administered  . Influenza, High Dose Seasonal PF 01/05/2018, 12/23/2018  . Tdap 11/14/2019  . Unspecified SARS-COV-2 Vaccination 04/01/2019, 05/02/2019   Pertinent  Health Maintenance Due  Topic Date Due  . DEXA SCAN  Never done  . PNA vac Low Risk Adult (1 of 2 - PCV13) Never done  . INFLUENZA VACCINE  10/30/2019   No flowsheet data found.   Vitals:   12/29/19 1452  BP: 117/65  Pulse: 72  Resp: 16  Temp: (!) 97 F (36.1 C)  Weight: 88 lb 9.6 oz (40.2 kg)  Height: 5' (1.524 m)   Body mass index is 17.3 kg/m. Physical Exam Vitals and nursing note reviewed.  Constitutional:      General: She is not in acute distress.    Appearance: She is underweight. She is not ill-appearing.  HENT:     Head: Normocephalic.     Nose: Nose normal. No congestion or rhinorrhea.     Mouth/Throat:     Mouth: Mucous membranes are moist.     Pharynx: Oropharynx is clear. No oropharyngeal exudate or posterior oropharyngeal erythema.  Eyes:     General: No scleral icterus.       Right eye: No discharge.        Left eye: No discharge.     Conjunctiva/sclera: Conjunctivae normal.     Pupils: Pupils are equal, round, and reactive to light.  Cardiovascular:     Rate and Rhythm: Normal rate and  regular rhythm.     Pulses: Normal pulses.     Heart sounds: Normal heart sounds. No murmur heard.  No friction rub. No gallop.   Pulmonary:     Effort: Pulmonary effort is normal. No respiratory distress.     Breath sounds: Normal breath sounds. No wheezing, rhonchi or rales.  Chest:     Chest wall: No tenderness.  Abdominal:     General: Bowel sounds are normal. There is no distension.     Palpations: Abdomen is soft. There is no mass.     Tenderness: There is no abdominal tenderness. There is no right CVA tenderness, left CVA tenderness, guarding or rebound.  Musculoskeletal:        General: No swelling or tenderness.     Cervical back: Normal range of motion. No rigidity or tenderness.     Right lower leg: No edema.     Left lower leg: No edema.     Comments: Unsteady gait   Lymphadenopathy:     Cervical: No cervical adenopathy.  Skin:    General: Skin is warm and dry.     Coloration: Skin is not pale.     Findings: No bruising, erythema or rash.     Comments: Left hip incision healed.   Neurological:     Mental Status: She is alert. Mental status is at baseline.     Cranial Nerves: No cranial nerve deficit.     Motor: No weakness.     Coordination: Coordination normal.     Gait: Gait abnormal.  Psychiatric:        Mood and Affect: Mood normal.        Speech: Speech normal.        Behavior: Behavior normal.        Thought Content: Thought content normal.        Cognition and Memory: Memory is impaired.        Judgment: Judgment normal.     Labs reviewed: Recent Labs    11/17/19 0302 11/17/19 0819 11/18/19 0516 11/19/19 0602 11/20/19 0518 11/20/19 0518 11/21/19 0600 11/22/19 0554 11/29/19 0000  NA   < >  --  138   < > 137   < > 142 139 133*  K   < >  --  3.5   < > 3.5   < > 4.1 4.7 4.4  CL   < >  --  107   < >  106   < > 105 102 98*  CO2   < >  --  21*   < > 22   < > 25 26 21   GLUCOSE   < >  --  91   < > 98  --  97 106*  --   BUN   < >  --  12   < > 11   <  > 10 11 8   CREATININE   < >  --  0.42*   < > 0.52   < > 0.54 0.65 0.5  CALCIUM   < >  --  8.7*   < > 8.8*   < > 9.5 9.7 9.2  MG  --  2.2 2.0  --   --   --   --  2.2  --    < > = values in this interval not displayed.    Recent Labs    11/14/19 0655 11/15/19 0320 11/17/19 0302 11/17/19 0302 11/18/19 0516 11/18/19 0516 11/19/19 0602 11/19/19 0602 11/20/19 0518 11/21/19 0600 11/29/19 0000  WBC 10.7*   < > 9.1   < > 7.9  --  9.0  --   --   --  4.1  NEUTROABS 9.1*  --   --   --   --   --   --   --   --   --   --   HGB 13.1   < > 9.7*   < > 9.0*   < > 10.5*   < > 9.0* 9.9* 10.1*  HCT 40.8   < > 30.5*   < > 28.2*   < > 33.1*   < > 27.9* 32.5* 31*  MCV 91.7   < > 92.1  --  91.3  --  92.7  --   --   --   --   PLT 272   < > 250   < > 298  --  373  --   --   --  457*   < > = values in this interval not displayed.   Lab Results  Component Value Date   TSH 1.810 11/17/2019   Lab Results  Component Value Date   HGBA1C (H) 01/27/2010    5.9 (NOTE)                                                                       According to the ADA Clinical Practice Recommendations for 2011, when HbA1c is used as a screening test:   >=6.5%   Diagnostic of Diabetes Mellitus           (if abnormal result  is confirmed)  5.7-6.4%   Increased risk of developing Diabetes Mellitus  References:Diagnosis and Classification of Diabetes Mellitus,Diabetes ZHGD,9242,68(TMHDQ 1):S62-S69 and Standards of Medical Care in         Diabetes - 2011,Diabetes QIWL,7989,21  (Suppl 1):S11-S61.   Lab Results  Component Value Date   CHOL  12/17/2006    127        ATP III CLASSIFICATION:  <200     mg/dL   Desirable  200-239  mg/dL   Borderline High  >=240    mg/dL   High   HDL  47 12/17/2006   LDLCALC  12/17/2006    65        Total Cholesterol/HDL:CHD Risk Coronary Heart Disease Risk Table                     Men   Women  1/2 Average Risk   3.4   3.3   TRIG 75 12/17/2006   CHOLHDL 2.7 12/17/2006    Significant  Diagnostic Results in last 30 days:  No results found.  Assessment/Plan 1. Essential hypertension B/p well controlled. - continue on metoprolol  2. Weight loss Weight stable for the past 6 days.continue to encourage oral intake. - continue on mirtazapine 7.5 mg tablet at bedtime.   3. Current moderate episode of major depressive disorder, unspecified whether recurrent ([place]) Mood stable since starting on Remeron. Continue on Remeron 7.5 mg tablet   4. Mild cognitive impairment Continue supportive care.   Family/ staff Communication: Reviewed plan of care with patient and facility Nurse   Labs/tests ordered:  None

## 2020-01-02 ENCOUNTER — Non-Acute Institutional Stay (SKILLED_NURSING_FACILITY): Payer: Medicare Other | Admitting: Family

## 2020-01-02 ENCOUNTER — Encounter: Payer: Self-pay | Admitting: Family

## 2020-01-02 DIAGNOSIS — S72002D Fracture of unspecified part of neck of left femur, subsequent encounter for closed fracture with routine healing: Secondary | ICD-10-CM

## 2020-01-02 DIAGNOSIS — F321 Major depressive disorder, single episode, moderate: Secondary | ICD-10-CM | POA: Diagnosis not present

## 2020-01-02 DIAGNOSIS — M810 Age-related osteoporosis without current pathological fracture: Secondary | ICD-10-CM

## 2020-01-02 DIAGNOSIS — R2681 Unsteadiness on feet: Secondary | ICD-10-CM | POA: Diagnosis not present

## 2020-01-02 DIAGNOSIS — I4891 Unspecified atrial fibrillation: Secondary | ICD-10-CM

## 2020-01-02 DIAGNOSIS — D509 Iron deficiency anemia, unspecified: Secondary | ICD-10-CM

## 2020-01-02 DIAGNOSIS — I10 Essential (primary) hypertension: Secondary | ICD-10-CM

## 2020-01-02 NOTE — Progress Notes (Signed)
Location:  [facility] Room Number: 511-P Place of Service:  SNF (31)  Provider: Marlowe Sax FNP-C   PCP: Lajean Manes, MD Patient Care Team: Lajean Manes, MD as PCP - General (Internal Medicine) Jerline Pain, MD as PCP - Cardiology (Cardiology) Thana Ates, RN as [place], LCSW as [place] [facility]  Extended Emergency Contact Information Primary Emergency Contact: [place] Work Phone: [phone] Mobile Phone: [phone] Relation: Other Preferred language: [place] needed? No Secondary Emergency Contact: [place] of [employer] Phone: [phone] Relation: Relative  Code Status: DNR Goals of care:  Advanced Directive information Advanced Directives 01/02/2020  Does Patient Have a Medical Advance Directive? Yes  Type of Paramedic of [place];Out of facility DNR (pink MOST or yellow form)  Does patient want to make changes to medical advance directive? No - Patient declined  Would patient like information on creating a medical advance directive? -  Pre-existing out of facility DNR order (yellow form or pink MOST form) -     Allergies  Allergen Reactions   Codeine Nausea And Vomiting   Crestor [Rosuvastatin Calcium]     myalgias   Escitalopram Oxalate     Abdominal problem   Ezetimibe-Simvastatin     myalgias    Chief Complaint  Patient presents with   Discharge Note    Discharge home on 01/04/2020    HPI:  86 y.o. [name] seen today at [facility] for discharge home on 01/04/2020.She was here for short term rehabilitation for post hospital admission from 11/14/2019 - 11/22/2019 for a fall onto he left hip after tripping.She was seen in the ED due to left hip pain worst with movement.she was hypertensive with Tachycardia with mild leukocytosis with left shift.Left  hip X-ray showed left displaced angulated hip fracture of femoral neck.Also had a left calf skin tear.she underwent left hip hemiarthroplasty on 11/14/2019 with Dr.Swinteck.Her pain was managed with I.V morphine,Norco and tylenol.she was discharged on Norco and Tylenol.Her potassium and mg were repleted.Had prolonged QTC due to electrolytes abnormalities but resolved.  She went into new onset Afib.Cardilogist consulted and was started on low dose of beta blocker .she had SVT and paroxysmal arterial tachycardia and BB dose was increased.she was treated with Eliquis for prophylaxis after surgery.Has cardiology outpatient follow up appointment to determine need for longer anticoagulation.She had weight loss. She had a barium esophagram with a tortuous distal esophagus with slow food passage done after she complained of dysphagia and food sticking and burping.No obstruction noted but results were mildly progressed vs 2016.she ws seen by speech Therapy and send here for rehab on a dysphagia 3 diet and follow with ST.  She tested positive for COVID-19 and was placed on droplet contact Isolation here in rehab.she did not present with COVID-19 symptoms.she completed her Quarantine.she had significant weight loss with poor oral intake and symptoms of depression.she continued to complain of dysphagia.she was seen by Facility Nutritionist recommended feeding tube but patient declined.Remeron 7.5 mg tablet daily was started for appetite stimulant and depression.Her appetite and depression improved. Code status was discussed with Dr.Reed patient did not CPR and definitely no feeding tube.MOST form was completed.     She has worked with PT/OT now stable for discharge home.She will be discharged home with Home health PT/OT to continue with ROM, Exercise, Gait stability and muscle strengthening. She will also discharge with Nursing and social  worker to address needs.No new DME required. Home health services will be arranged by  facility social worker prior to discharge. Prescription medication will be written x 1 month then patient to follow up with PCP in 1-2 weeks.She will follow up with Cardiology and Orthopedic as directed.she denies any acute issues this visit. Facility staff report no new concerns.    Past Medical History:  Diagnosis Date   Arthritis    Hip fracture ([place]) 2009   surgery   History of depression    History of gallstones    History of IBS    History of vertigo    Hyperlipidemia    Hypertension    RBBB (right bundle branch block)     Past Surgical History:  Procedure Laterality Date   CHOLECYSTECTOMY  2008   Dr. Harlow Asa   OTHER SURGICAL HISTORY  1973   hysterectomy   TONSILECTOMY, ADENOIDECTOMY, BILATERAL MYRINGOTOMY AND TUBES     age 52 tonsils and adnoids only   TOTAL HIP ARTHROPLASTY  02/20/2008   fractured hip   TOTAL HIP ARTHROPLASTY Left 11/14/2019   Procedure: LEFT HIP HEMIARTHROPLASTY ANTERIOR APPROACH;  Surgeon: Rod Can, MD;  Location: [facility];  Service: Orthopedics;  Laterality: Left;      reports that she has never smoked. She has never used smokeless tobacco. She reports that she does not drink alcohol and does not use drugs. Social History   Socioeconomic History   Marital status: Widowed    Spouse name: Not on file   Number of children: 3   Years of education: Not on file   Highest education level: Not on file  Occupational History   Occupation: Retired  Tobacco Use   Smoking status: Never Smoker   Smokeless tobacco: Never Used  Substance and Sexual Activity   Alcohol use: No    Alcohol/week: 0.0 standard drinks   Drug use: No   Sexual activity: Not on file  Other Topics Concern   Not on file  Social History Narrative   Not on file   Social Determinants of Health   Financial Resource Strain:    Difficulty of Paying Living Expenses: Not on file  Food Insecurity:    Worried About Charity fundraiser in the Last Year:  Not on file   [place] in the Last Year: Not on file  Transportation Needs:    Lack of Transportation (Medical): Not on file   Lack of Transportation (Non-Medical): Not on file  Physical Activity:    Days of Exercise per Week: Not on file   Minutes of Exercise per Session: Not on file  Stress:    Feeling of Stress : Not on file  Social Connections:    Frequency of Communication with Friends and Family: Not on file   Frequency of Social Gatherings with Friends and Family: Not on file   Attends Religious Services: Not on file   Active Member of Clubs or Organizations: Not on file   Attends Archivist Meetings: Not on file   Marital Status: Not on file  Intimate Partner Violence:    Fear of Current or Ex-Partner: Not on file   Emotionally Abused: Not on file   Physically Abused: Not on file   Sexually Abused: Not on file     Allergies  Allergen Reactions   Codeine Nausea And Vomiting   Crestor [Rosuvastatin Calcium]     myalgias   Escitalopram Oxalate     Abdominal problem  Ezetimibe-Simvastatin     myalgias    Pertinent  Health Maintenance Due  Topic Date Due   DEXA SCAN  Never done   PNA vac Low Risk Adult (1 of 2 - PCV13) Never done   INFLUENZA VACCINE  10/30/2019    Medications: Outpatient Encounter Medications as of 01/02/2020  Medication Sig   acetaminophen (TYLENOL) 500 MG tablet Take 500 mg by mouth every 6 (six) hours as needed for moderate pain.   apixaban (ELIQUIS) 2.5 MG TABS tablet Take 1 tablet (2.5 mg total) by mouth 2 (two) times daily.   BISACODYL LAXATIVE RE Place 10 mg rectally as needed.   docusate sodium (COLACE) 100 MG capsule Take 1 capsule (100 mg total) by mouth 2 (two) times daily.   iron polysaccharides (NU-IRON) 150 MG capsule Take 1 capsule (150 mg total) by mouth daily.   Magnesium Hydroxide (MILK OF MAGNESIA PO) Take 30 mLs by mouth.   melatonin 3 MG TABS tablet Take 1 tablet (3 mg total) by  mouth at bedtime as needed (INSOMNIA).   metoprolol tartrate (LOPRESSOR) 25 MG tablet Take 1 tablet (25 mg total) by mouth 2 (two) times daily.   mirtazapine (REMERON) 15 MG tablet Take 0.5 tablets (7.5 mg total) by mouth at bedtime. Take 1/2 tablet &7.5 mg)po daily at bedtimes for appetite/depression/weight loss   ondansetron (ZOFRAN) 4 MG tablet Take 1 tablet (4 mg total) by mouth in the morning, at noon, in the evening, and at bedtime. administer 1 tab PO before meals and at bedtime   pantoprazole (PROTONIX) 40 MG tablet Take 1 tablet (40 mg total) by mouth daily at 6 (six) AM.   polyethylene glycol (MIRALAX / GLYCOLAX) 17 g packet Take 17 g by mouth daily.   Sodium Phosphates (RA SALINE ENEMA RE) Place rectally as needed.   vitamin B-[name] (CYANOCOBALAMIN) 1000 MCG tablet Take 1 tablet (1,000 mcg total) by mouth daily.   [DISCONTINUED] apixaban (ELIQUIS) 2.5 MG TABS tablet Take 1 tablet (2.5 mg total) by mouth 2 (two) times daily.   [DISCONTINUED] diltiazem (CARDIZEM CD) 120 MG 24 hr capsule Take 1 capsule (120 mg total) by mouth daily.   [DISCONTINUED] esomeprazole (NEXIUM) 40 MG capsule Take 1 capsule (40 mg total) by mouth 2 (two) times daily before a meal. (Patient not taking: Reported on 05/13/2018)   [DISCONTINUED] iron polysaccharides (NU-IRON) 150 MG capsule Take 1 capsule (150 mg total) by mouth daily.   [DISCONTINUED] metoCLOPramide (REGLAN) 5 MG tablet Take 1 tablet (5 mg total) by mouth 3 (three) times daily with meals as needed for nausea (take 65mins before meals). (Patient not taking: Reported on 05/13/2018)   [DISCONTINUED] metoprolol tartrate (LOPRESSOR) 25 MG tablet Take 1 tablet (25 mg total) by mouth 2 (two) times daily.   [DISCONTINUED] mirtazapine (REMERON) 15 MG tablet Take 7.5 mg by mouth at bedtime. Take 1/2 tablet &7.5 mg)po daily at bedtimes for appetite/depression/weight loss    [DISCONTINUED] ondansetron (ZOFRAN) 4 MG tablet Take 4 mg by mouth. administer 1  tab PO before meals and at bedtime   [DISCONTINUED] pantoprazole (PROTONIX) 40 MG tablet Take 1 tablet (40 mg total) by mouth daily at 6 (six) AM.   [DISCONTINUED] promethazine (PHENERGAN) [name].5 MG tablet Take 1 tablet ([name].5 mg total) by mouth every 6 (six) hours as needed for nausea or vomiting. (Patient not taking: Reported on 05/13/2018)   No facility-administered encounter medications on file as of 01/02/2020.     Review of Systems  Constitutional: Positive for appetite  change. Negative for chills, fatigue and fever.       Weight loss   HENT: Positive for hearing loss. Negative for congestion, rhinorrhea, sinus pressure, sinus pain, sneezing and sore throat.   Eyes: Negative for discharge, redness and itching.  Respiratory: Negative for cough, chest tightness, shortness of breath and wheezing.   Cardiovascular: Negative for chest pain, palpitations and leg swelling.  Gastrointestinal: Negative for abdominal distention, abdominal pain, constipation, diarrhea, nausea and vomiting.  Endocrine: Negative for cold intolerance, heat intolerance, polydipsia, polyphagia and polyuria.  Genitourinary: Negative for difficulty urinating, dysuria, flank pain, frequency and urgency.  Musculoskeletal: Positive for arthralgias and gait problem. Negative for joint swelling and myalgias.  Skin: Negative for color change, pallor and rash.  Neurological: Negative for dizziness, speech difficulty, weakness, light-headedness, numbness and headaches.  Hematological: Does not bruise/bleed easily.  Psychiatric/Behavioral: Negative for agitation, decreased concentration and sleep disturbance. The patient is not nervous/anxious.     Vitals:   01/02/20 1450  BP: 127/70  Pulse: 97  Resp: 18  Temp: (!) 97 F (36.1 C)  Weight: 88 lb 9.6 oz (40.2 kg)  Height: 5' (1.524 m)   Body mass index is 17.3 kg/m. Physical Exam Vitals and nursing note reviewed.  Constitutional:      General: She is not in acute  distress.    Appearance: She is underweight. She is not ill-appearing.  HENT:     Head: Normocephalic.     Nose: Nose normal. No congestion or rhinorrhea.     Mouth/Throat:     Mouth: Mucous membranes are moist.     Pharynx: Oropharynx is clear. No oropharyngeal exudate or posterior oropharyngeal erythema.  Eyes:     General: No scleral icterus.       Right eye: No discharge.        Left eye: No discharge.     Extraocular Movements: Extraocular movements intact.     Conjunctiva/sclera: Conjunctivae normal.     Pupils: Pupils are equal, round, and reactive to light.  Neck:     Vascular: No carotid bruit.  Cardiovascular:     Rate and Rhythm: Normal rate and regular rhythm.     Pulses: Normal pulses.     Heart sounds: Normal heart sounds. No murmur heard.  No friction rub. No gallop.   Pulmonary:     Effort: Pulmonary effort is normal. No respiratory distress.     Breath sounds: Normal breath sounds. No wheezing, rhonchi or rales.  Chest:     Chest wall: No tenderness.  Abdominal:     General: Bowel sounds are normal. There is no distension.     Palpations: Abdomen is soft. There is no mass.     Tenderness: There is no abdominal tenderness. There is no right CVA tenderness, left CVA tenderness, guarding or rebound.  Musculoskeletal:        General: No swelling or tenderness.     Cervical back: Normal range of motion. No rigidity or tenderness.     Right lower leg: No edema.     Left lower leg: No edema.     Comments: Unsteady gait   Lymphadenopathy:     Cervical: No cervical adenopathy.  Skin:    General: Skin is warm and dry.     Coloration: Skin is not pale.     Findings: No bruising, erythema or rash.     Comments: Left hip surgical incision healed without any signs of infection   Neurological:     Mental  Status: She is alert. Mental status is at baseline.     Sensory: No sensory deficit.     Motor: No weakness.     Coordination: Coordination normal.     Gait: Gait  abnormal.  Psychiatric:        Mood and Affect: Mood normal.        Thought Content: Thought content normal.     Labs reviewed: Basic Metabolic Panel: Recent Labs    11/17/19 0302 11/17/19 0819 11/18/19 0516 11/19/19 0602 11/20/19 0518 11/20/19 0518 11/21/19 0600 11/22/19 0554 11/29/19 0000  NA   < >  --  138   < > 137   < > 142 139 133*  K   < >  --  3.5   < > 3.5   < > 4.1 4.7 4.4  CL   < >  --  107   < > 106   < > 105 102 98*  CO2   < >  --  21*   < > 22   < > 25 26 21   GLUCOSE   < >  --  91   < > 98  --  97 106*  --   BUN   < >  --  [name]   < > 11   < > 10 11 8   CREATININE   < >  --  0.42*   < > 0.52   < > 0.54 0.65 0.5  CALCIUM   < >  --  8.7*   < > 8.8*   < > 9.5 9.7 9.2  MG  --  2.2 2.0  --   --   --   --  2.2  --    < > = values in this interval not displayed.   CBC: Recent Labs    11/14/19 0655 11/15/19 0320 11/17/19 0302 11/17/19 0302 11/18/19 0516 11/18/19 0516 11/19/19 0602 11/19/19 0602 11/20/19 0518 11/21/19 0600 11/29/19 0000  WBC 10.7*   < > 9.1   < > 7.9  --  9.0  --   --   --  4.1  NEUTROABS 9.1*  --   --   --   --   --   --   --   --   --   --   HGB 13.1   < > 9.7*   < > 9.0*   < > 10.5*   < > 9.0* 9.9* 10.1*  HCT 40.8   < > 30.5*   < > 28.2*   < > 33.1*   < > 27.9* 32.5* 31*  MCV 91.7   < > 92.1  --  91.3  --  92.7  --   --   --   --   PLT 272   < > 250   < > 298  --  373  --   --   --  457*   < > = values in this interval not displayed.    Procedures and Imaging Studies During Stay: No results found.  Assessment/Plan:    1. Unsteady gait - Status post left Hip Hemiarthroplasty on 11/14/2019 with Dr.Swinteck. Has worked with PT/ OT. She will discharge home PT/OT to continue with ROM, Exercise, Gait stability and muscle strengthening.No new DME required. Fall and safety precautions.   2. Essential hypertension B/p well controlled. - continue on Metoprolol  - metoprolol tartrate (LOPRESSOR) 25 MG tablet; Take 1 tablet (25 mg total) by  mouth  2 (two) times daily.  Dispense: 60 tablet; Refill: 0 - CBC/diff and BMP with PCP in 1-2 weeks   3. New onset atrial fibrillation ([place]) - continue on apixaban and metoprolol  - apixaban (ELIQUIS) 2.5 MG TABS tablet; Take 1 tablet (2.5 mg total) by mouth 2 (two) times daily.  Dispense: 60 tablet; Refill: 0 - metoprolol tartrate (LOPRESSOR) 25 MG tablet; Take 1 tablet (25 mg total) by mouth 2 (two) times daily.  Dispense: 60 tablet; Refill: 0 - pantoprazole (PROTONIX) 40 MG tablet; Take 1 tablet (40 mg total) by mouth daily at 6 (six) AM.  Dispense: 30 tablet; Refill: 0 - CBC/diff with PCP in 1-2 weeks   4. Current moderate episode of major depressive disorder, unspecified whether recurrent (HCC) Mood has improved on Remeron  - mirtazapine (REMERON) 15 MG tablet; Take 0.5 tablets (7.5 mg total) by mouth at bedtime. Take 1/2 tablet &7.5 mg)po daily at bedtimes for appetite/depression/weight loss  Dispense: 15 tablet; Refill: 0  5. Senile osteoporosis Due for bone density discuss need with PCP   6. Closed fracture of left hip with routine healing, subsequent encounter Status post left Hip Hemiarthroplasty on 11/14/2019 with Dr.Swinteck. - continue on Tylenol for pain well under controlled.  - CBC/diff and BMP with PCP in 1-2 weeks   7. Iron deficiency anemia, unspecified iron deficiency anemia type Hgb has improved from 9.9 to 10.1 post op - continue on Nu-Iron  - iron polysaccharides (NU-IRON) 150 MG capsule; Take 1 capsule (150 mg total) by mouth daily.  Dispense: 30 capsule; Refill: 0 - CBC/diff in 1-2 weeks with PCP   Patient is being discharged with the following home health services:   -PT/OT for ROM, exercise, gait stability and muscle strengthening  -  HH RN for incision care management  - Social worker for social needs.    Patient is being discharged with the following durable medical equipment:   - No new DME required   Patient has been advised to f/u with their PCP in  1-2 weeks to for a transitions of care visit.Social services at their facility was responsible for arranging this appointment.  Pt was provided with adequate prescriptions of noncontrolled medications to reach the scheduled appointment.For controlled substances, a limited supply was provided as appropriate for the individual patient. If the pt normally receives these medications from a pain clinic or has a contract with another physician, these medications should be received from that clinic or physician only).    Future labs/tests needed:  CBC, BMP in 1-2 weeks PCP

## 2020-01-03 ENCOUNTER — Other Ambulatory Visit: Payer: Self-pay | Admitting: *Deleted

## 2020-01-03 NOTE — Patient Outreach (Signed)
[facility] Post- Acute Care Coordinator follow up. Member screened for potential [facility] Care Management needs as a benefit of [place] [employer].  Per Patient Pearletha [place]ge member resides in [place].   Update received from [facility] indicating member will transition to home with caregivers and [facility] on tomorrow 01/04/20. Also reports family is looking to the PACE program in November  Telephone call made to DPR/daughter in law Augusto Gamble [phone] to discuss [place] Management services. No answer. Left HIPAA compliant voicemail message to request call back.    Marthenia Rolling, MSN-Ed, RN,BSN [place] Acute Care Coordinator [phone] [facility]) [phone]  (Toll free office)

## 2020-01-03 NOTE — Patient Outreach (Signed)
[facility] Post- Acute Care Coordinator follow up. Member screened for potential [facility] Care Management needs as a benefit of [place] [employer].  Spoke with Augusto Gamble (DPR/daughter in law). Patient identifiers confirmed. Opal Sidles endorses Mrs. Kazmierski lives alone. States member will have caregiver assistance for 2 hours in morning and 2 hrs in evening. Has medical alert system. Will have [facility] that is supposed to come out on this Saturday. States member is supposed to have an evaluation from PACE as well. However, PACE evaluation could not be scheduled until member came home per Opal Sidles.   Explained [place] Management services. Opal Sidles is agreeable. Opal Sidles also reports member will need assistance with transportation and meals. Opal Sidles endorses that she should be called for post SNF calls at [phone] since Mrs. Eldred "gets confused about things."  Will make referral for [place] Management for care coordination and [facility] LCSW for meals and transportation.    Marthenia Rolling, MSN-Ed, RN,BSN [place] Acute Care Coordinator [phone] [facility]) [phone]  (Toll free office)

## 2020-01-04 ENCOUNTER — Ambulatory Visit: Payer: Medicare Other | Admitting: Nurse Practitioner

## 2020-01-05 ENCOUNTER — Ambulatory Visit: Payer: Self-pay | Admitting: *Deleted

## 2020-01-05 ENCOUNTER — Other Ambulatory Visit: Payer: Self-pay

## 2020-01-05 MED ORDER — APIXABAN 2.5 MG PO TABS: 2.5000 mg | ORAL_TABLET | Freq: Two times a day (BID) | ORAL | 0 refills | Status: AC

## 2020-01-05 MED ORDER — MIRTAZAPINE 15 MG PO TABS: 7.5000 mg | ORAL_TABLET | Freq: Every day | ORAL | 0 refills | Status: AC

## 2020-01-05 MED ORDER — PANTOPRAZOLE SODIUM 40 MG PO TBEC: 40.0000 mg | DELAYED_RELEASE_TABLET | Freq: Every day | ORAL | 0 refills | Status: AC

## 2020-01-05 MED ORDER — ONDANSETRON HCL 4 MG PO TABS: 4.0000 mg | ORAL_TABLET | Freq: Four times a day (QID) | ORAL | 0 refills | Status: AC

## 2020-01-05 MED ORDER — POLYSACCHARIDE IRON COMPLEX 150 MG PO CAPS: 150.0000 mg | ORAL_CAPSULE | Freq: Every day | ORAL | 0 refills | Status: AC

## 2020-01-05 MED ORDER — METOPROLOL TARTRATE 25 MG PO TABS: 25.0000 mg | ORAL_TABLET | Freq: Two times a day (BID) | ORAL | 0 refills | Status: AC

## 2020-01-05 NOTE — Patient Outreach (Signed)
[facility]) Care Management  01/05/2020  [name] [name] Apr 02, 1922 [id number]   New referral SNF discharge.  Placed call to patient contact Jane. No answer. Unidentified voicemail- no message left.  PLAN: will mail unsuccessful outreach and attempt again in 3-4 business days.   Tomasa Rand, RN, BSN, CEN [facility] [employer] [phone]

## 2020-01-06 ENCOUNTER — Encounter: Payer: Self-pay | Admitting: *Deleted

## 2020-01-06 ENCOUNTER — Other Ambulatory Visit: Payer: Self-pay | Admitting: *Deleted

## 2020-01-06 NOTE — Patient Outreach (Signed)
[facility] [facility] [facility]) Care Management  01/06/2020  MAGNOLIA MATTILA 05/16/1922 [id number]   CSW made an initial attempt to try and contact patient and patient's daughter, Augusto Gamble today, to perform the initial phone assessment on patient, as well as assess and assist with social work needs and services, without success.  A HIPAA compliant message was on voicemail for patient, as well as Mrs. Justice, and CSW is just awaiting a return call.  CSW will make a second outreach attempt within the next 3-4 business days, if a return call is not received from patient, or Mrs. Justice, in the meantime.  CSW will also mail a Patient Unsuccessful Outreach Letter to patient's home, requesting that patient, or Mrs. Justice, contact CSW at their earliest convenience if they are interested in receiving social work services through [place] with [facility].  Nat Christen, BSW, MSW, LCSW  Licensed Education officer, environmental Health System  Mailing [place] [address] [place], [place] [address] Physical Address-[address] [place], [place] [address] Toll Free Main # [phone] Fax # [phone] Cell # [phone]  [email or web address]

## 2020-01-07 DIAGNOSIS — Z7901 Long term (current) use of anticoagulants: Secondary | ICD-10-CM | POA: Diagnosis not present

## 2020-01-07 DIAGNOSIS — K589 Irritable bowel syndrome without diarrhea: Secondary | ICD-10-CM | POA: Diagnosis not present

## 2020-01-07 DIAGNOSIS — Z9181 History of falling: Secondary | ICD-10-CM | POA: Diagnosis not present

## 2020-01-07 DIAGNOSIS — E785 Hyperlipidemia, unspecified: Secondary | ICD-10-CM | POA: Diagnosis not present

## 2020-01-07 DIAGNOSIS — S72002D Fracture of unspecified part of neck of left femur, subsequent encounter for closed fracture with routine healing: Secondary | ICD-10-CM | POA: Diagnosis not present

## 2020-01-07 DIAGNOSIS — I10 Essential (primary) hypertension: Secondary | ICD-10-CM | POA: Diagnosis not present

## 2020-01-07 DIAGNOSIS — F0390 Unspecified dementia without behavioral disturbance: Secondary | ICD-10-CM | POA: Diagnosis not present

## 2020-01-07 DIAGNOSIS — F331 Major depressive disorder, recurrent, moderate: Secondary | ICD-10-CM | POA: Diagnosis not present

## 2020-01-07 DIAGNOSIS — I4510 Unspecified right bundle-branch block: Secondary | ICD-10-CM | POA: Diagnosis not present

## 2020-01-07 DIAGNOSIS — G8929 Other chronic pain: Secondary | ICD-10-CM | POA: Diagnosis not present

## 2020-01-09 DIAGNOSIS — F331 Major depressive disorder, recurrent, moderate: Secondary | ICD-10-CM | POA: Diagnosis not present

## 2020-01-09 DIAGNOSIS — I10 Essential (primary) hypertension: Secondary | ICD-10-CM | POA: Diagnosis not present

## 2020-01-09 DIAGNOSIS — F0390 Unspecified dementia without behavioral disturbance: Secondary | ICD-10-CM | POA: Diagnosis not present

## 2020-01-09 DIAGNOSIS — E785 Hyperlipidemia, unspecified: Secondary | ICD-10-CM | POA: Diagnosis not present

## 2020-01-09 DIAGNOSIS — K589 Irritable bowel syndrome without diarrhea: Secondary | ICD-10-CM | POA: Diagnosis not present

## 2020-01-09 DIAGNOSIS — S72002D Fracture of unspecified part of neck of left femur, subsequent encounter for closed fracture with routine healing: Secondary | ICD-10-CM | POA: Diagnosis not present

## 2020-01-10 ENCOUNTER — Other Ambulatory Visit: Payer: Self-pay

## 2020-01-10 NOTE — Patient Outreach (Signed)
[place] [facility]) Care Management  01/10/2020  [name] [name] 1922-05-31 [id number]   Telephone assessment of needs:   Placed call to point of contact Augusto Gamble- no answer. Left a message requesting a call back. Noted social worker also having difficulty reaching patient.  PLAN: already mailed outreach letter. Will call back in 3-4 business days.   Tomasa Rand, RN, BSN, CEN [facility] [employer] [phone]

## 2020-01-11 ENCOUNTER — Other Ambulatory Visit: Payer: Self-pay | Admitting: *Deleted

## 2020-01-11 NOTE — Patient Outreach (Signed)
[facility]) Care Management  01/11/2020  JLYNN LY June 13, 1922 [id number]   CSW made a second attempt to try and contact patient and patient's daughter, Augusto Gamble today, to perform the initial phone assessment on patient, as well as assess and assist with social work needs and services, without success.  A HIPAA compliant message was left for patient on voicemail.  CSW also left a HIPAA compliant message on voicemail for Mrs. Justice.  CSW continues to await a return call.  CSW will make a third outreach attempt within the next 3-4 business days, if a return call is not received from patient or Mrs. Justice in the meantime.    Nat Christen, BSW, MSW, LCSW  Licensed Education officer, environmental Health System  Mailing [place] [address], [place], [place] [address] Physical Address-[address] [place], [place] [address] Toll Free Main # [phone] Fax # [phone] Cell # [phone]  [email or web address]

## 2020-01-12 ENCOUNTER — Ambulatory Visit: Payer: Self-pay | Admitting: *Deleted

## 2020-01-16 ENCOUNTER — Ambulatory Visit: Payer: Self-pay

## 2020-01-17 DIAGNOSIS — Z23 Encounter for immunization: Secondary | ICD-10-CM | POA: Diagnosis not present

## 2020-01-17 DIAGNOSIS — E441 Mild protein-calorie malnutrition: Secondary | ICD-10-CM | POA: Diagnosis not present

## 2020-01-17 DIAGNOSIS — I10 Essential (primary) hypertension: Secondary | ICD-10-CM | POA: Diagnosis not present

## 2020-01-17 DIAGNOSIS — I480 Paroxysmal atrial fibrillation: Secondary | ICD-10-CM | POA: Diagnosis not present

## 2020-01-17 DIAGNOSIS — Z79899 Other long term (current) drug therapy: Secondary | ICD-10-CM | POA: Diagnosis not present

## 2020-01-17 DIAGNOSIS — D6869 Other thrombophilia: Secondary | ICD-10-CM | POA: Diagnosis not present

## 2020-01-18 ENCOUNTER — Other Ambulatory Visit: Payer: Self-pay | Admitting: *Deleted

## 2020-01-18 DIAGNOSIS — F331 Major depressive disorder, recurrent, moderate: Secondary | ICD-10-CM | POA: Diagnosis not present

## 2020-01-18 DIAGNOSIS — K589 Irritable bowel syndrome without diarrhea: Secondary | ICD-10-CM | POA: Diagnosis not present

## 2020-01-18 DIAGNOSIS — I10 Essential (primary) hypertension: Secondary | ICD-10-CM | POA: Diagnosis not present

## 2020-01-18 DIAGNOSIS — F0390 Unspecified dementia without behavioral disturbance: Secondary | ICD-10-CM | POA: Diagnosis not present

## 2020-01-18 DIAGNOSIS — S72002D Fracture of unspecified part of neck of left femur, subsequent encounter for closed fracture with routine healing: Secondary | ICD-10-CM | POA: Diagnosis not present

## 2020-01-18 DIAGNOSIS — E785 Hyperlipidemia, unspecified: Secondary | ICD-10-CM | POA: Diagnosis not present

## 2020-01-18 NOTE — Patient Outreach (Signed)
[facility]) Care Management  01/18/2020  MARISSAH VANDEMARK 1923-01-29 [id number]   CSW made a third attempt to try and contact patient and patient's daughter, Augusto Gamble today, to perform the initial phone assessment on patient, as well as assess and assist with social work needs and services, without success.  HIPAA compliant messages were left for patient and Mrs. Justice on voicemail.  CSW continues to await a return call.  CSW will make a fourth and final outreach attempt within the next 3 weeks, if a return call is not received from patient or Mrs. Justice in the meantime.  Nat Christen, BSW, MSW, LCSW  Licensed Education officer, environmental Health System  Mailing [place] [address], [place], [place] [address] Physical Address-[address] [place], [place] [address] Toll Free Main # [phone] Fax # [phone] Cell # [phone]  [email or web address]

## 2020-01-19 DIAGNOSIS — F331 Major depressive disorder, recurrent, moderate: Secondary | ICD-10-CM | POA: Diagnosis not present

## 2020-01-19 DIAGNOSIS — F0390 Unspecified dementia without behavioral disturbance: Secondary | ICD-10-CM | POA: Diagnosis not present

## 2020-01-19 DIAGNOSIS — K589 Irritable bowel syndrome without diarrhea: Secondary | ICD-10-CM | POA: Diagnosis not present

## 2020-01-19 DIAGNOSIS — E785 Hyperlipidemia, unspecified: Secondary | ICD-10-CM | POA: Diagnosis not present

## 2020-01-19 DIAGNOSIS — I10 Essential (primary) hypertension: Secondary | ICD-10-CM | POA: Diagnosis not present

## 2020-01-19 DIAGNOSIS — S72002D Fracture of unspecified part of neck of left femur, subsequent encounter for closed fracture with routine healing: Secondary | ICD-10-CM | POA: Diagnosis not present

## 2020-01-23 ENCOUNTER — Other Ambulatory Visit: Payer: Self-pay

## 2020-01-23 NOTE — Patient Outreach (Signed)
[place] [facility]) Care Management  01/23/2020  [name] [name] [birth year] [id number]    Telephone assessment:  Placed 3rd call to patient for assessment of needs. No answer.  PLAN: will attempt again in 4 weeks. 3 call attempts made and outreach letter sent without response at this time. Also noted that [place] worker has not been able to reach wither.  ( this call 1 week late due to this case manager on PAL).  Tomasa Rand, RN, BSN, CEN [facility] [employer] [phone]

## 2020-01-24 DIAGNOSIS — S72002D Fracture of unspecified part of neck of left femur, subsequent encounter for closed fracture with routine healing: Secondary | ICD-10-CM | POA: Diagnosis not present

## 2020-01-24 DIAGNOSIS — F0390 Unspecified dementia without behavioral disturbance: Secondary | ICD-10-CM | POA: Diagnosis not present

## 2020-01-24 DIAGNOSIS — E785 Hyperlipidemia, unspecified: Secondary | ICD-10-CM | POA: Diagnosis not present

## 2020-01-24 DIAGNOSIS — I10 Essential (primary) hypertension: Secondary | ICD-10-CM | POA: Diagnosis not present

## 2020-01-24 DIAGNOSIS — F331 Major depressive disorder, recurrent, moderate: Secondary | ICD-10-CM | POA: Diagnosis not present

## 2020-01-24 DIAGNOSIS — K589 Irritable bowel syndrome without diarrhea: Secondary | ICD-10-CM | POA: Diagnosis not present

## 2020-01-25 DIAGNOSIS — E785 Hyperlipidemia, unspecified: Secondary | ICD-10-CM | POA: Diagnosis not present

## 2020-01-25 DIAGNOSIS — K589 Irritable bowel syndrome without diarrhea: Secondary | ICD-10-CM | POA: Diagnosis not present

## 2020-01-25 DIAGNOSIS — F0390 Unspecified dementia without behavioral disturbance: Secondary | ICD-10-CM | POA: Diagnosis not present

## 2020-01-25 DIAGNOSIS — F331 Major depressive disorder, recurrent, moderate: Secondary | ICD-10-CM | POA: Diagnosis not present

## 2020-01-25 DIAGNOSIS — I10 Essential (primary) hypertension: Secondary | ICD-10-CM | POA: Diagnosis not present

## 2020-01-25 DIAGNOSIS — S72002D Fracture of unspecified part of neck of left femur, subsequent encounter for closed fracture with routine healing: Secondary | ICD-10-CM | POA: Diagnosis not present

## 2020-01-26 DIAGNOSIS — S72002D Fracture of unspecified part of neck of left femur, subsequent encounter for closed fracture with routine healing: Secondary | ICD-10-CM | POA: Diagnosis not present

## 2020-01-26 DIAGNOSIS — F0390 Unspecified dementia without behavioral disturbance: Secondary | ICD-10-CM | POA: Diagnosis not present

## 2020-01-26 DIAGNOSIS — I10 Essential (primary) hypertension: Secondary | ICD-10-CM | POA: Diagnosis not present

## 2020-01-26 DIAGNOSIS — E785 Hyperlipidemia, unspecified: Secondary | ICD-10-CM | POA: Diagnosis not present

## 2020-01-26 DIAGNOSIS — K589 Irritable bowel syndrome without diarrhea: Secondary | ICD-10-CM | POA: Diagnosis not present

## 2020-01-26 DIAGNOSIS — F331 Major depressive disorder, recurrent, moderate: Secondary | ICD-10-CM | POA: Diagnosis not present

## 2020-01-30 DIAGNOSIS — S72002D Fracture of unspecified part of neck of left femur, subsequent encounter for closed fracture with routine healing: Secondary | ICD-10-CM | POA: Diagnosis not present

## 2020-01-30 DIAGNOSIS — F331 Major depressive disorder, recurrent, moderate: Secondary | ICD-10-CM | POA: Diagnosis not present

## 2020-01-30 DIAGNOSIS — K589 Irritable bowel syndrome without diarrhea: Secondary | ICD-10-CM | POA: Diagnosis not present

## 2020-01-30 DIAGNOSIS — E785 Hyperlipidemia, unspecified: Secondary | ICD-10-CM | POA: Diagnosis not present

## 2020-01-30 DIAGNOSIS — I10 Essential (primary) hypertension: Secondary | ICD-10-CM | POA: Diagnosis not present

## 2020-01-30 DIAGNOSIS — F0390 Unspecified dementia without behavioral disturbance: Secondary | ICD-10-CM | POA: Diagnosis not present

## 2020-01-31 DIAGNOSIS — F0390 Unspecified dementia without behavioral disturbance: Secondary | ICD-10-CM | POA: Diagnosis not present

## 2020-01-31 DIAGNOSIS — I10 Essential (primary) hypertension: Secondary | ICD-10-CM | POA: Diagnosis not present

## 2020-01-31 DIAGNOSIS — F331 Major depressive disorder, recurrent, moderate: Secondary | ICD-10-CM | POA: Diagnosis not present

## 2020-01-31 DIAGNOSIS — S72002D Fracture of unspecified part of neck of left femur, subsequent encounter for closed fracture with routine healing: Secondary | ICD-10-CM | POA: Diagnosis not present

## 2020-01-31 DIAGNOSIS — E785 Hyperlipidemia, unspecified: Secondary | ICD-10-CM | POA: Diagnosis not present

## 2020-01-31 DIAGNOSIS — K589 Irritable bowel syndrome without diarrhea: Secondary | ICD-10-CM | POA: Diagnosis not present

## 2020-02-02 DIAGNOSIS — K589 Irritable bowel syndrome without diarrhea: Secondary | ICD-10-CM | POA: Diagnosis not present

## 2020-02-02 DIAGNOSIS — S72002D Fracture of unspecified part of neck of left femur, subsequent encounter for closed fracture with routine healing: Secondary | ICD-10-CM | POA: Diagnosis not present

## 2020-02-02 DIAGNOSIS — F0390 Unspecified dementia without behavioral disturbance: Secondary | ICD-10-CM | POA: Diagnosis not present

## 2020-02-02 DIAGNOSIS — I10 Essential (primary) hypertension: Secondary | ICD-10-CM | POA: Diagnosis not present

## 2020-02-02 DIAGNOSIS — F331 Major depressive disorder, recurrent, moderate: Secondary | ICD-10-CM | POA: Diagnosis not present

## 2020-02-02 DIAGNOSIS — E785 Hyperlipidemia, unspecified: Secondary | ICD-10-CM | POA: Diagnosis not present

## 2020-02-06 DIAGNOSIS — I10 Essential (primary) hypertension: Secondary | ICD-10-CM | POA: Diagnosis not present

## 2020-02-06 DIAGNOSIS — I4510 Unspecified right bundle-branch block: Secondary | ICD-10-CM | POA: Diagnosis not present

## 2020-02-06 DIAGNOSIS — F0390 Unspecified dementia without behavioral disturbance: Secondary | ICD-10-CM | POA: Diagnosis not present

## 2020-02-06 DIAGNOSIS — E785 Hyperlipidemia, unspecified: Secondary | ICD-10-CM | POA: Diagnosis not present

## 2020-02-06 DIAGNOSIS — F331 Major depressive disorder, recurrent, moderate: Secondary | ICD-10-CM | POA: Diagnosis not present

## 2020-02-06 DIAGNOSIS — K589 Irritable bowel syndrome without diarrhea: Secondary | ICD-10-CM | POA: Diagnosis not present

## 2020-02-06 DIAGNOSIS — G8929 Other chronic pain: Secondary | ICD-10-CM | POA: Diagnosis not present

## 2020-02-06 DIAGNOSIS — S72002D Fracture of unspecified part of neck of left femur, subsequent encounter for closed fracture with routine healing: Secondary | ICD-10-CM | POA: Diagnosis not present

## 2020-02-06 DIAGNOSIS — Z7901 Long term (current) use of anticoagulants: Secondary | ICD-10-CM | POA: Diagnosis not present

## 2020-02-06 DIAGNOSIS — Z9181 History of falling: Secondary | ICD-10-CM | POA: Diagnosis not present

## 2020-02-07 DIAGNOSIS — I10 Essential (primary) hypertension: Secondary | ICD-10-CM | POA: Diagnosis not present

## 2020-02-07 DIAGNOSIS — S72002D Fracture of unspecified part of neck of left femur, subsequent encounter for closed fracture with routine healing: Secondary | ICD-10-CM | POA: Diagnosis not present

## 2020-02-07 DIAGNOSIS — F331 Major depressive disorder, recurrent, moderate: Secondary | ICD-10-CM | POA: Diagnosis not present

## 2020-02-07 DIAGNOSIS — E785 Hyperlipidemia, unspecified: Secondary | ICD-10-CM | POA: Diagnosis not present

## 2020-02-07 DIAGNOSIS — K589 Irritable bowel syndrome without diarrhea: Secondary | ICD-10-CM | POA: Diagnosis not present

## 2020-02-07 DIAGNOSIS — F0390 Unspecified dementia without behavioral disturbance: Secondary | ICD-10-CM | POA: Diagnosis not present

## 2020-02-08 ENCOUNTER — Other Ambulatory Visit: Payer: Self-pay | Admitting: *Deleted

## 2020-02-08 ENCOUNTER — Other Ambulatory Visit: Payer: Self-pay | Admitting: Family

## 2020-02-08 ENCOUNTER — Encounter: Payer: Self-pay | Admitting: *Deleted

## 2020-02-08 DIAGNOSIS — I4891 Unspecified atrial fibrillation: Secondary | ICD-10-CM

## 2020-02-08 DIAGNOSIS — F331 Major depressive disorder, recurrent, moderate: Secondary | ICD-10-CM | POA: Diagnosis not present

## 2020-02-08 DIAGNOSIS — S72002D Fracture of unspecified part of neck of left femur, subsequent encounter for closed fracture with routine healing: Secondary | ICD-10-CM | POA: Diagnosis not present

## 2020-02-08 DIAGNOSIS — E785 Hyperlipidemia, unspecified: Secondary | ICD-10-CM | POA: Diagnosis not present

## 2020-02-08 DIAGNOSIS — K589 Irritable bowel syndrome without diarrhea: Secondary | ICD-10-CM | POA: Diagnosis not present

## 2020-02-08 DIAGNOSIS — I10 Essential (primary) hypertension: Secondary | ICD-10-CM

## 2020-02-08 DIAGNOSIS — F321 Major depressive disorder, single episode, moderate: Secondary | ICD-10-CM

## 2020-02-08 DIAGNOSIS — F0390 Unspecified dementia without behavioral disturbance: Secondary | ICD-10-CM | POA: Diagnosis not present

## 2020-02-08 NOTE — Patient Outreach (Signed)
[place] [facility]) Care Management  02/08/2020  MIDGE MOMON 01-07-1923 [id number]    CSW made a fourth and final attempt to try and contact patient and patient's daughter, Augusto Gamble today, to perform the initial phone assessment, as well as assess and assist with social work needs and services, without success.  HIPAA compliant messages were left on voicemail for patient and Mrs. Justice.  CSW continues to await a return call.  CSW will proceed with case closure, as required number of phone attempts have been made and an Unsuccessful Outreach Letter was mailed to patient's home, allowing patient and Mrs. Justice more than a month to respond if they were interested in receiving social work services through [place] with Scientist, clinical (histocompatibility and immunogenetics).   Nat Christen, BSW, MSW, LCSW  Licensed Education officer, environmental [facility]  [address] [place] [address] [place], [place] [address] Physical Address-[address], [place], [place] [address] Toll Free Main # [phone] Fax # [phone] Cell # [phone]  [email or web address]

## 2020-02-09 DIAGNOSIS — E785 Hyperlipidemia, unspecified: Secondary | ICD-10-CM | POA: Diagnosis not present

## 2020-02-09 DIAGNOSIS — K589 Irritable bowel syndrome without diarrhea: Secondary | ICD-10-CM | POA: Diagnosis not present

## 2020-02-09 DIAGNOSIS — I10 Essential (primary) hypertension: Secondary | ICD-10-CM | POA: Diagnosis not present

## 2020-02-09 DIAGNOSIS — F0390 Unspecified dementia without behavioral disturbance: Secondary | ICD-10-CM | POA: Diagnosis not present

## 2020-02-09 DIAGNOSIS — F331 Major depressive disorder, recurrent, moderate: Secondary | ICD-10-CM | POA: Diagnosis not present

## 2020-02-09 DIAGNOSIS — S72002D Fracture of unspecified part of neck of left femur, subsequent encounter for closed fracture with routine healing: Secondary | ICD-10-CM | POA: Diagnosis not present

## 2020-02-13 DIAGNOSIS — S72032D Displaced midcervical fracture of left femur, subsequent encounter for closed fracture with routine healing: Secondary | ICD-10-CM | POA: Diagnosis not present

## 2020-02-14 DIAGNOSIS — F331 Major depressive disorder, recurrent, moderate: Secondary | ICD-10-CM | POA: Diagnosis not present

## 2020-02-14 DIAGNOSIS — E785 Hyperlipidemia, unspecified: Secondary | ICD-10-CM | POA: Diagnosis not present

## 2020-02-14 DIAGNOSIS — K589 Irritable bowel syndrome without diarrhea: Secondary | ICD-10-CM | POA: Diagnosis not present

## 2020-02-14 DIAGNOSIS — F0390 Unspecified dementia without behavioral disturbance: Secondary | ICD-10-CM | POA: Diagnosis not present

## 2020-02-14 DIAGNOSIS — S72002D Fracture of unspecified part of neck of left femur, subsequent encounter for closed fracture with routine healing: Secondary | ICD-10-CM | POA: Diagnosis not present

## 2020-02-14 DIAGNOSIS — I10 Essential (primary) hypertension: Secondary | ICD-10-CM | POA: Diagnosis not present

## 2020-02-15 DIAGNOSIS — S72002D Fracture of unspecified part of neck of left femur, subsequent encounter for closed fracture with routine healing: Secondary | ICD-10-CM | POA: Diagnosis not present

## 2020-02-15 DIAGNOSIS — E785 Hyperlipidemia, unspecified: Secondary | ICD-10-CM | POA: Diagnosis not present

## 2020-02-15 DIAGNOSIS — K589 Irritable bowel syndrome without diarrhea: Secondary | ICD-10-CM | POA: Diagnosis not present

## 2020-02-15 DIAGNOSIS — F0390 Unspecified dementia without behavioral disturbance: Secondary | ICD-10-CM | POA: Diagnosis not present

## 2020-02-15 DIAGNOSIS — I10 Essential (primary) hypertension: Secondary | ICD-10-CM | POA: Diagnosis not present

## 2020-02-15 DIAGNOSIS — F331 Major depressive disorder, recurrent, moderate: Secondary | ICD-10-CM | POA: Diagnosis not present

## 2020-02-20 ENCOUNTER — Other Ambulatory Visit: Payer: Self-pay

## 2020-02-20 NOTE — Patient Outreach (Signed)
[place] [facility]) Care Management  02/20/2020  [name] [name] [birth year] [id number]   Case closure:  Placed 4th call to patient which was again unsuccessful.    PLAN: case closure as unable to reach by nursing or Education officer, museum.   Tomasa Rand, RN, BSN, CEN [facility] [employer] [phone]

## 2020-03-01 ENCOUNTER — Ambulatory Visit: Payer: Medicare Other | Admitting: Podiatry

## 2020-03-06 DIAGNOSIS — F331 Major depressive disorder, recurrent, moderate: Secondary | ICD-10-CM | POA: Diagnosis not present

## 2020-03-06 DIAGNOSIS — I10 Essential (primary) hypertension: Secondary | ICD-10-CM | POA: Diagnosis not present

## 2020-03-06 DIAGNOSIS — E785 Hyperlipidemia, unspecified: Secondary | ICD-10-CM | POA: Diagnosis not present

## 2020-03-06 DIAGNOSIS — F0390 Unspecified dementia without behavioral disturbance: Secondary | ICD-10-CM | POA: Diagnosis not present

## 2020-03-06 DIAGNOSIS — S72002D Fracture of unspecified part of neck of left femur, subsequent encounter for closed fracture with routine healing: Secondary | ICD-10-CM | POA: Diagnosis not present

## 2020-03-06 DIAGNOSIS — K589 Irritable bowel syndrome without diarrhea: Secondary | ICD-10-CM | POA: Diagnosis not present

## 2020-03-15 ENCOUNTER — Other Ambulatory Visit: Payer: Self-pay

## 2020-03-15 ENCOUNTER — Ambulatory Visit (INDEPENDENT_AMBULATORY_CARE_PROVIDER_SITE_OTHER): Payer: Medicare Other | Admitting: Podiatry

## 2020-03-15 DIAGNOSIS — M79674 Pain in right toe(s): Secondary | ICD-10-CM

## 2020-03-15 DIAGNOSIS — B351 Tinea unguium: Secondary | ICD-10-CM | POA: Diagnosis not present

## 2020-03-15 DIAGNOSIS — I872 Venous insufficiency (chronic) (peripheral): Secondary | ICD-10-CM

## 2020-03-15 DIAGNOSIS — M79675 Pain in left toe(s): Secondary | ICD-10-CM | POA: Diagnosis not present

## 2020-03-15 NOTE — Progress Notes (Signed)
  Subjective:  Patient ID: [name] [name], female    DOB: April 30, 1922,  MRN: [id number]  Chief Complaint  Patient presents with  . Nail Problem     (NP)RFC-Nail Trim    86 y.o. female presents with the above complaint. History confirmed with patient.  Here with her daughter.  The nails are painful and thickened elongated and unable to cut them at home.  She has some edema on the left foot and they wonder if there is anything about this  Objective:  Physical Exam: warm, good capillary refill, no trophic changes or ulcerative lesions, normal DP and PT pulses, normal sensory exam and onychomycosis x10 with elongation nail dystrophy and subungual debris.  +2 pitting edema bilateral lower extremities Assessment:   1. Onychomycosis   2. Pain due to onychomycosis of toenails of both feet   3. Venous (peripheral) insufficiency      Plan:  Patient was evaluated and treated and all questions answered.  Discussed the etiology and treatment options for the condition in detail with the patient. Educated patient on the topical and oral treatment options for mycotic nails. Recommended debridement of the nails today. Sharp and mechanical debridement performed of all painful and mycotic nails today. Nails debrided in length and thickness using a nail nipper and a mechanical burr to level of comfort. Discussed treatment options including appropriate shoe gear. Follow up as needed for painful nails.  For the edema I think this is secondary to venous peripheral insufficiency.  She has some varicose veins.  Encouraged her discuss with her PCP for further management may need referral to vascular.  Recommended she wear compression stockings  Return in about 3 months (around 06/13/2020).

## 2020-03-15 NOTE — Patient Instructions (Addendum)

## 2020-04-18 DIAGNOSIS — I10 Essential (primary) hypertension: Secondary | ICD-10-CM | POA: Diagnosis not present

## 2020-04-18 DIAGNOSIS — R600 Localized edema: Secondary | ICD-10-CM | POA: Diagnosis not present

## 2020-04-18 DIAGNOSIS — M25511 Pain in right shoulder: Secondary | ICD-10-CM | POA: Diagnosis not present

## 2020-04-18 DIAGNOSIS — R609 Edema, unspecified: Secondary | ICD-10-CM | POA: Diagnosis not present

## 2020-04-18 DIAGNOSIS — M79605 Pain in left leg: Secondary | ICD-10-CM | POA: Diagnosis not present

## 2020-04-18 DIAGNOSIS — M7121 Synovial cyst of popliteal space [Baker], right knee: Secondary | ICD-10-CM | POA: Diagnosis not present

## 2020-05-03 DIAGNOSIS — Z1389 Encounter for screening for other disorder: Secondary | ICD-10-CM | POA: Diagnosis not present

## 2020-05-03 DIAGNOSIS — S32010D Wedge compression fracture of first lumbar vertebra, subsequent encounter for fracture with routine healing: Secondary | ICD-10-CM | POA: Diagnosis not present

## 2020-05-03 DIAGNOSIS — I10 Essential (primary) hypertension: Secondary | ICD-10-CM | POA: Diagnosis not present

## 2020-05-03 DIAGNOSIS — M810 Age-related osteoporosis without current pathological fracture: Secondary | ICD-10-CM | POA: Diagnosis not present

## 2020-05-03 DIAGNOSIS — F331 Major depressive disorder, recurrent, moderate: Secondary | ICD-10-CM | POA: Diagnosis not present

## 2020-05-03 DIAGNOSIS — I700 Atherosclerosis of aorta: Secondary | ICD-10-CM | POA: Diagnosis not present

## 2020-05-03 DIAGNOSIS — Z0000 Encounter for general adult medical examination without abnormal findings: Secondary | ICD-10-CM | POA: Diagnosis not present

## 2020-06-14 ENCOUNTER — Ambulatory Visit: Payer: Medicare Other | Admitting: Podiatry

## 2020-09-13 DIAGNOSIS — I4891 Unspecified atrial fibrillation: Secondary | ICD-10-CM | POA: Diagnosis not present

## 2020-09-13 DIAGNOSIS — G4700 Insomnia, unspecified: Secondary | ICD-10-CM | POA: Diagnosis not present

## 2020-09-13 DIAGNOSIS — F419 Anxiety disorder, unspecified: Secondary | ICD-10-CM | POA: Diagnosis not present

## 2020-09-13 DIAGNOSIS — I10 Essential (primary) hypertension: Secondary | ICD-10-CM | POA: Diagnosis not present

## 2020-09-13 DIAGNOSIS — K5900 Constipation, unspecified: Secondary | ICD-10-CM | POA: Diagnosis not present

## 2020-09-13 DIAGNOSIS — F339 Major depressive disorder, recurrent, unspecified: Secondary | ICD-10-CM | POA: Diagnosis not present

## 2020-09-13 DIAGNOSIS — M259 Joint disorder, unspecified: Secondary | ICD-10-CM | POA: Diagnosis not present

## 2020-10-24 DIAGNOSIS — F339 Major depressive disorder, recurrent, unspecified: Secondary | ICD-10-CM | POA: Diagnosis not present

## 2020-10-24 DIAGNOSIS — M259 Joint disorder, unspecified: Secondary | ICD-10-CM | POA: Diagnosis not present

## 2020-10-24 DIAGNOSIS — I4891 Unspecified atrial fibrillation: Secondary | ICD-10-CM | POA: Diagnosis not present

## 2020-10-24 DIAGNOSIS — I10 Essential (primary) hypertension: Secondary | ICD-10-CM | POA: Diagnosis not present

## 2020-11-08 DIAGNOSIS — R300 Dysuria: Secondary | ICD-10-CM | POA: Diagnosis not present

## 2020-12-07 DIAGNOSIS — Z0000 Encounter for general adult medical examination without abnormal findings: Secondary | ICD-10-CM | POA: Diagnosis not present

## 2020-12-07 DIAGNOSIS — R54 Age-related physical debility: Secondary | ICD-10-CM | POA: Diagnosis not present

## 2020-12-07 DIAGNOSIS — F339 Major depressive disorder, recurrent, unspecified: Secondary | ICD-10-CM | POA: Diagnosis not present

## 2020-12-10 DIAGNOSIS — Z23 Encounter for immunization: Secondary | ICD-10-CM | POA: Diagnosis not present

## 2020-12-14 DIAGNOSIS — F419 Anxiety disorder, unspecified: Secondary | ICD-10-CM | POA: Diagnosis not present

## 2020-12-14 DIAGNOSIS — F339 Major depressive disorder, recurrent, unspecified: Secondary | ICD-10-CM | POA: Diagnosis not present

## 2020-12-14 DIAGNOSIS — I10 Essential (primary) hypertension: Secondary | ICD-10-CM | POA: Diagnosis not present

## 2020-12-14 DIAGNOSIS — M1990 Unspecified osteoarthritis, unspecified site: Secondary | ICD-10-CM | POA: Diagnosis not present

## 2020-12-18 DIAGNOSIS — F339 Major depressive disorder, recurrent, unspecified: Secondary | ICD-10-CM | POA: Diagnosis not present

## 2020-12-18 DIAGNOSIS — R110 Nausea: Secondary | ICD-10-CM | POA: Diagnosis not present

## 2020-12-18 DIAGNOSIS — I10 Essential (primary) hypertension: Secondary | ICD-10-CM | POA: Diagnosis not present

## 2020-12-19 DIAGNOSIS — S81802A Unspecified open wound, left lower leg, initial encounter: Secondary | ICD-10-CM | POA: Diagnosis not present

## 2020-12-25 DIAGNOSIS — S81802A Unspecified open wound, left lower leg, initial encounter: Secondary | ICD-10-CM | POA: Diagnosis not present

## 2021-01-25 DIAGNOSIS — Z23 Encounter for immunization: Secondary | ICD-10-CM | POA: Diagnosis not present

## 2021-01-28 DIAGNOSIS — F339 Major depressive disorder, recurrent, unspecified: Secondary | ICD-10-CM | POA: Diagnosis not present

## 2021-01-28 DIAGNOSIS — S81812D Laceration without foreign body, left lower leg, subsequent encounter: Secondary | ICD-10-CM | POA: Diagnosis not present

## 2021-01-28 DIAGNOSIS — R54 Age-related physical debility: Secondary | ICD-10-CM | POA: Diagnosis not present

## 2021-01-28 DIAGNOSIS — I10 Essential (primary) hypertension: Secondary | ICD-10-CM | POA: Diagnosis not present

## 2021-01-28 DIAGNOSIS — I4891 Unspecified atrial fibrillation: Secondary | ICD-10-CM | POA: Diagnosis not present

## 2021-01-28 DIAGNOSIS — M1990 Unspecified osteoarthritis, unspecified site: Secondary | ICD-10-CM | POA: Diagnosis not present

## 2021-01-28 DIAGNOSIS — K5900 Constipation, unspecified: Secondary | ICD-10-CM | POA: Diagnosis not present

## 2021-02-13 DIAGNOSIS — F339 Major depressive disorder, recurrent, unspecified: Secondary | ICD-10-CM | POA: Diagnosis not present

## 2021-02-13 DIAGNOSIS — R54 Age-related physical debility: Secondary | ICD-10-CM | POA: Diagnosis not present

## 2021-02-13 DIAGNOSIS — I10 Essential (primary) hypertension: Secondary | ICD-10-CM | POA: Diagnosis not present

## 2021-02-13 DIAGNOSIS — G4700 Insomnia, unspecified: Secondary | ICD-10-CM | POA: Diagnosis not present

## 2021-02-13 DIAGNOSIS — K5900 Constipation, unspecified: Secondary | ICD-10-CM | POA: Diagnosis not present

## 2021-02-13 DIAGNOSIS — M25511 Pain in right shoulder: Secondary | ICD-10-CM | POA: Diagnosis not present

## 2021-02-27 DIAGNOSIS — R54 Age-related physical debility: Secondary | ICD-10-CM | POA: Diagnosis not present

## 2021-02-27 DIAGNOSIS — I10 Essential (primary) hypertension: Secondary | ICD-10-CM | POA: Diagnosis not present

## 2021-02-28 DIAGNOSIS — R079 Chest pain, unspecified: Secondary | ICD-10-CM | POA: Diagnosis not present

## 2021-02-28 DIAGNOSIS — F339 Major depressive disorder, recurrent, unspecified: Secondary | ICD-10-CM | POA: Diagnosis not present

## 2021-02-28 DIAGNOSIS — R110 Nausea: Secondary | ICD-10-CM | POA: Diagnosis not present

## 2021-02-28 DIAGNOSIS — I10 Essential (primary) hypertension: Secondary | ICD-10-CM | POA: Diagnosis not present

## 2021-03-01 DIAGNOSIS — K5900 Constipation, unspecified: Secondary | ICD-10-CM | POA: Diagnosis not present

## 2021-03-01 DIAGNOSIS — I10 Essential (primary) hypertension: Secondary | ICD-10-CM | POA: Diagnosis not present

## 2021-03-01 DIAGNOSIS — F419 Anxiety disorder, unspecified: Secondary | ICD-10-CM | POA: Diagnosis not present

## 2021-03-01 DIAGNOSIS — G894 Chronic pain syndrome: Secondary | ICD-10-CM | POA: Diagnosis not present

## 2021-03-01 DIAGNOSIS — R54 Age-related physical debility: Secondary | ICD-10-CM | POA: Diagnosis not present

## 2021-03-01 DIAGNOSIS — M1990 Unspecified osteoarthritis, unspecified site: Secondary | ICD-10-CM | POA: Diagnosis not present

## 2021-03-08 DIAGNOSIS — R54 Age-related physical debility: Secondary | ICD-10-CM | POA: Diagnosis not present

## 2021-03-08 DIAGNOSIS — I10 Essential (primary) hypertension: Secondary | ICD-10-CM | POA: Diagnosis not present

## 2021-03-22 DIAGNOSIS — R0789 Other chest pain: Secondary | ICD-10-CM | POA: Diagnosis not present

## 2021-04-19 DIAGNOSIS — I10 Essential (primary) hypertension: Secondary | ICD-10-CM | POA: Diagnosis not present

## 2021-04-19 DIAGNOSIS — G4700 Insomnia, unspecified: Secondary | ICD-10-CM | POA: Diagnosis not present

## 2021-04-19 DIAGNOSIS — M25511 Pain in right shoulder: Secondary | ICD-10-CM | POA: Diagnosis not present

## 2021-04-29 DIAGNOSIS — I10 Essential (primary) hypertension: Secondary | ICD-10-CM | POA: Diagnosis not present

## 2021-04-29 DIAGNOSIS — R54 Age-related physical debility: Secondary | ICD-10-CM | POA: Diagnosis not present

## 2021-04-29 DIAGNOSIS — M1990 Unspecified osteoarthritis, unspecified site: Secondary | ICD-10-CM | POA: Diagnosis not present

## 2021-04-29 DIAGNOSIS — I4891 Unspecified atrial fibrillation: Secondary | ICD-10-CM | POA: Diagnosis not present

## 2021-05-14 DIAGNOSIS — I10 Essential (primary) hypertension: Secondary | ICD-10-CM | POA: Diagnosis not present

## 2021-05-14 DIAGNOSIS — Z9151 Personal history of suicidal behavior: Secondary | ICD-10-CM | POA: Diagnosis not present

## 2021-05-28 DIAGNOSIS — R54 Age-related physical debility: Secondary | ICD-10-CM | POA: Diagnosis not present

## 2021-05-28 DIAGNOSIS — I10 Essential (primary) hypertension: Secondary | ICD-10-CM | POA: Diagnosis not present

## 2021-05-30 DIAGNOSIS — G8929 Other chronic pain: Secondary | ICD-10-CM | POA: Diagnosis not present

## 2021-06-26 DIAGNOSIS — M1990 Unspecified osteoarthritis, unspecified site: Secondary | ICD-10-CM | POA: Diagnosis not present

## 2021-07-24 DIAGNOSIS — I10 Essential (primary) hypertension: Secondary | ICD-10-CM | POA: Diagnosis not present

## 2021-07-24 DIAGNOSIS — R54 Age-related physical debility: Secondary | ICD-10-CM | POA: Diagnosis not present

## 2021-07-30 DIAGNOSIS — G8929 Other chronic pain: Secondary | ICD-10-CM | POA: Diagnosis not present

## 2021-07-30 DIAGNOSIS — M1990 Unspecified osteoarthritis, unspecified site: Secondary | ICD-10-CM | POA: Diagnosis not present

## 2021-08-16 DIAGNOSIS — I10 Essential (primary) hypertension: Secondary | ICD-10-CM | POA: Diagnosis not present

## 2021-08-16 DIAGNOSIS — R54 Age-related physical debility: Secondary | ICD-10-CM | POA: Diagnosis not present

## 2021-09-04 DIAGNOSIS — R54 Age-related physical debility: Secondary | ICD-10-CM | POA: Diagnosis not present

## 2021-09-04 DIAGNOSIS — I10 Essential (primary) hypertension: Secondary | ICD-10-CM | POA: Diagnosis not present

## 2021-09-09 DIAGNOSIS — G8929 Other chronic pain: Secondary | ICD-10-CM | POA: Diagnosis not present

## 2021-11-13 DIAGNOSIS — G8929 Other chronic pain: Secondary | ICD-10-CM | POA: Diagnosis not present

## 2021-11-13 DIAGNOSIS — R5383 Other fatigue: Secondary | ICD-10-CM | POA: Diagnosis not present

## 2021-11-23 DIAGNOSIS — R58 Hemorrhage, not elsewhere classified: Secondary | ICD-10-CM | POA: Diagnosis not present

## 2021-11-23 DIAGNOSIS — W19XXXA Unspecified fall, initial encounter: Secondary | ICD-10-CM | POA: Diagnosis not present

## 2021-11-23 DIAGNOSIS — R110 Nausea: Secondary | ICD-10-CM | POA: Diagnosis not present

## 2021-11-23 DIAGNOSIS — G4489 Other headache syndrome: Secondary | ICD-10-CM | POA: Diagnosis not present

## 2021-12-13 DIAGNOSIS — R5383 Other fatigue: Secondary | ICD-10-CM | POA: Diagnosis not present

## 2021-12-29 DEATH — deceased

## 2022-08-09 IMAGING — RF DG ESOPHAGUS
7 series · 12 of 16 positions shown · non-contrast
Comparison: Esophagram 07/10/2014

CLINICAL DATA: Feeling of food sticking in esophagus.

EXAM:
ESOPHOGRAM/BARIUM SWALLOW
TECHNIQUE: Single contrast examination was performed using  thin barium.
FLUOROSCOPY TIME:  Fluoroscopy Time:  1 minutes 12 seconds
Radiation Exposure Index (if provided by the fluoroscopic device):
12 mGy
Number of Acquired Spot Images: 7

[Series 1: fluoro_barium 2fps_bw · 0.19mm/px · 1 of 2 frames shown (1 of 6)]
[frame 1/2]
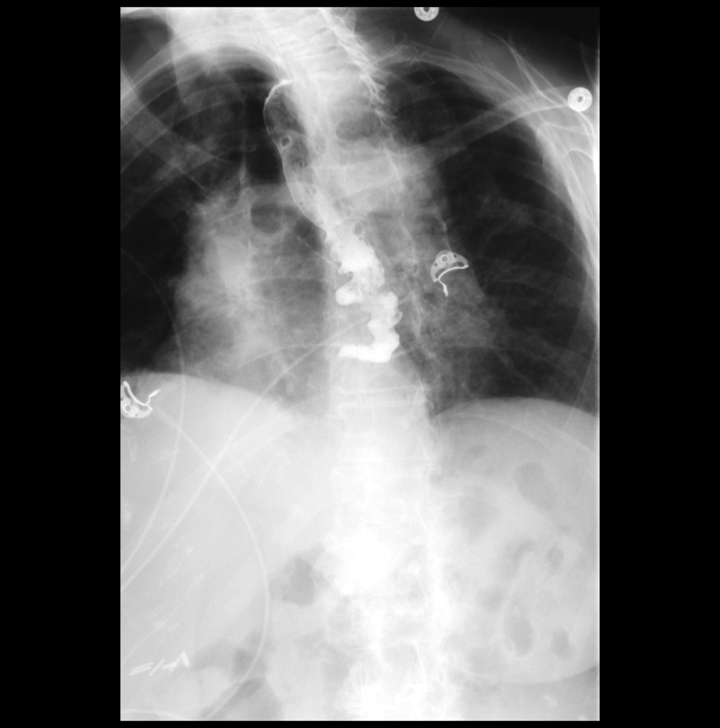

[Series 2: cp_standard · 0.58mm/px · 3 of 16 frames shown]
[frame 1/16]
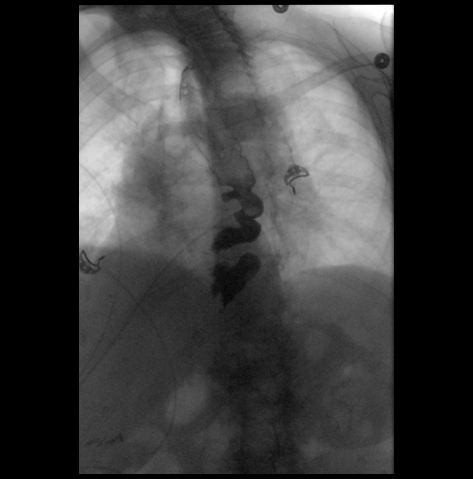
[frame 3/16]
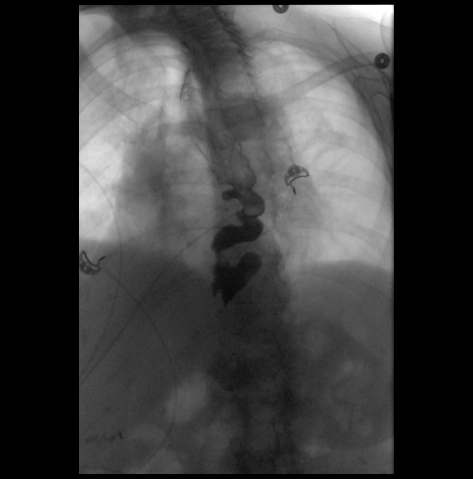
[frame 9/16]
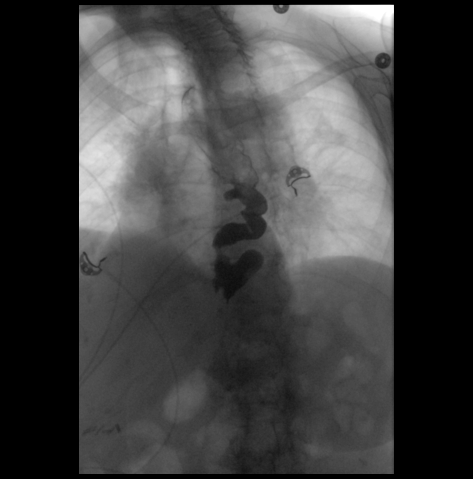

[Series 3: fluoro_barium 2fps_bw · 0.19mm/px · 3 of 3 frames shown (2 of 6)]
[frame 1/3]
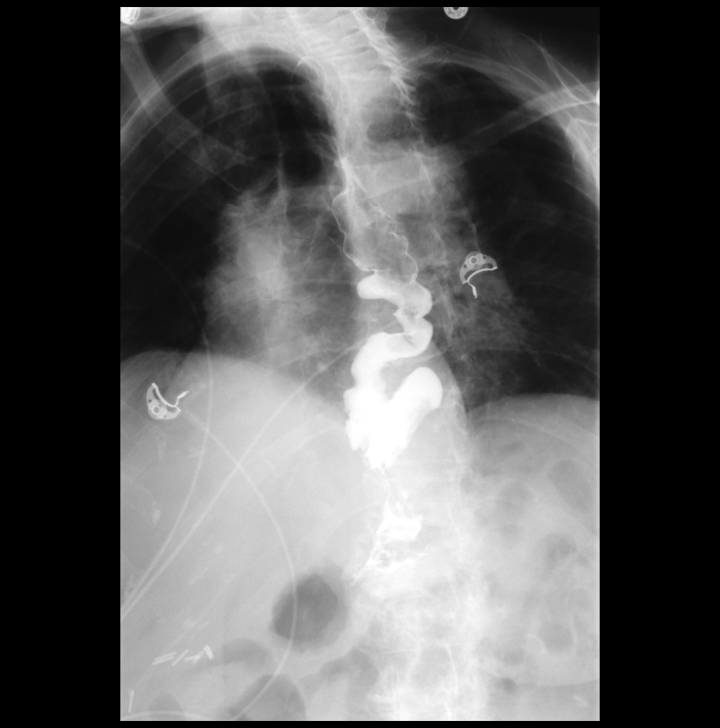
[frame 2/3]
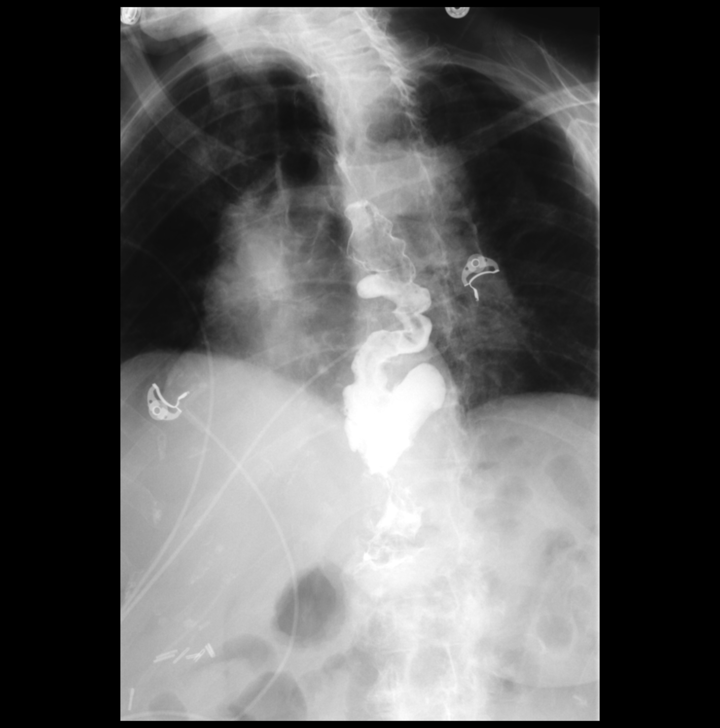
[frame 3/3]
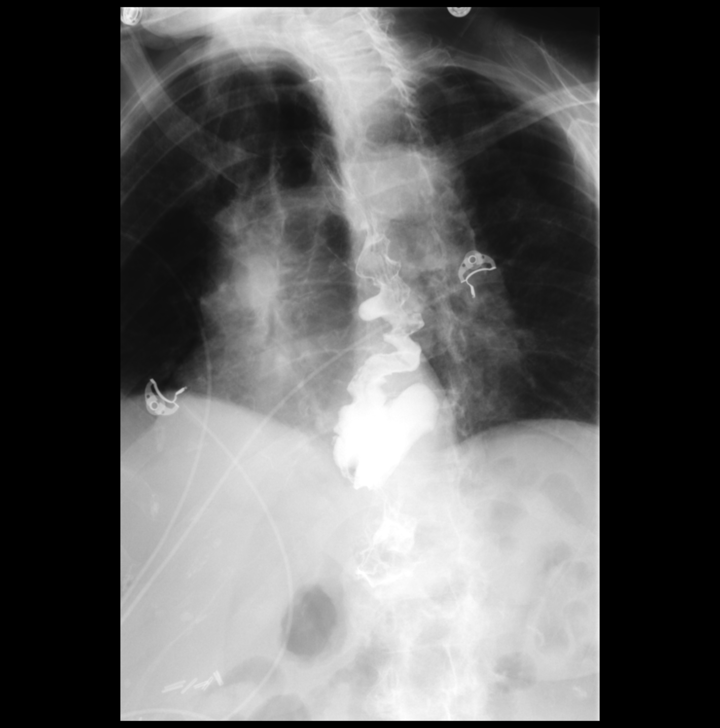

[Series 4: fluoro_barium 2fps_bw · 0.19mm/px · 1 of 2 frames shown (3 of 6)]
[frame 2/2]
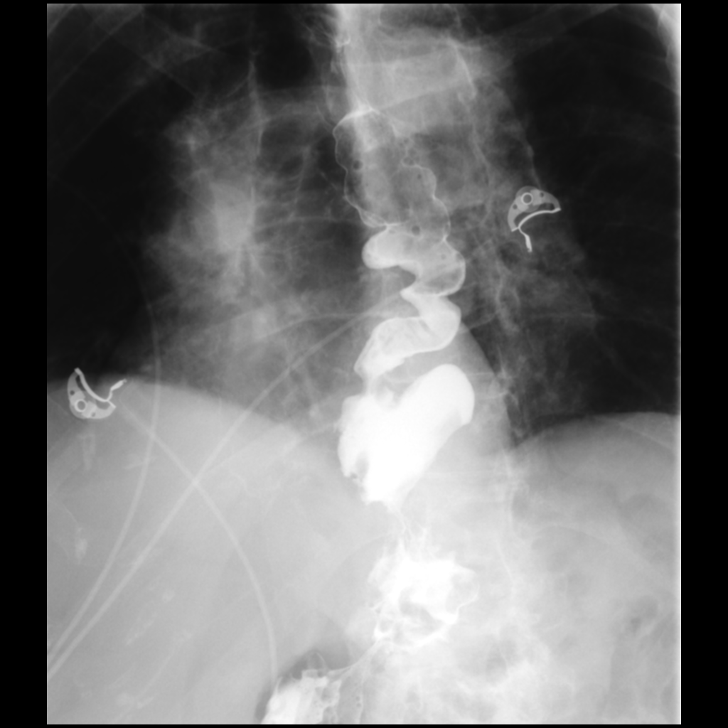

[Series 5: fluoro_barium 2fps_bw · 0.19mm/px · 2 of 2 frames shown (4 of 6)]
[frame 1/2]
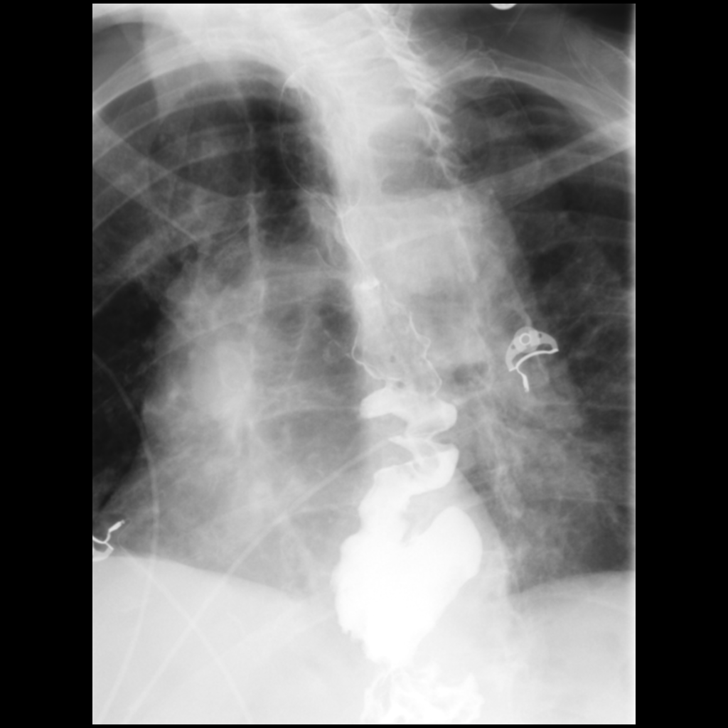
[frame 2/2]
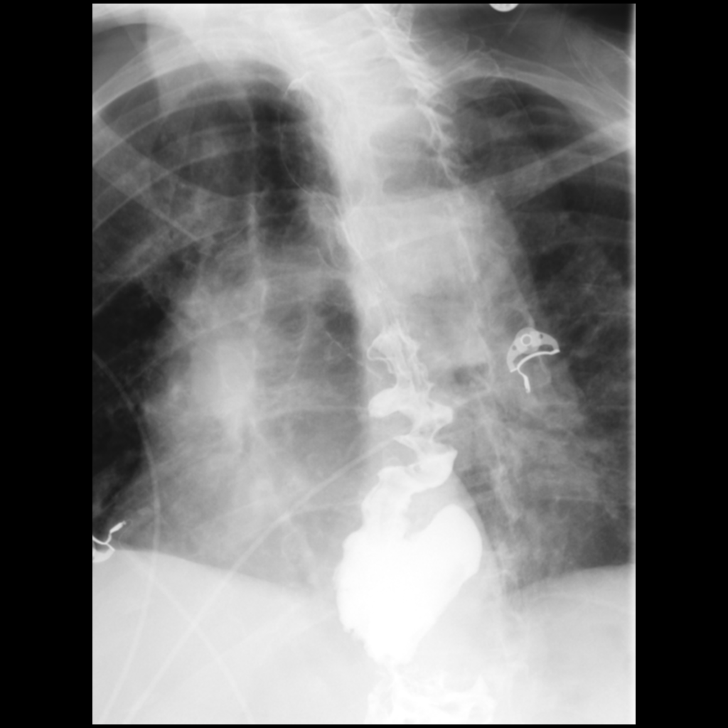

[Series 6: fluoro_barium 2fps_bw · 0.19mm/px · 1 of 2 frames shown (5 of 6)]
[frame 2/2]
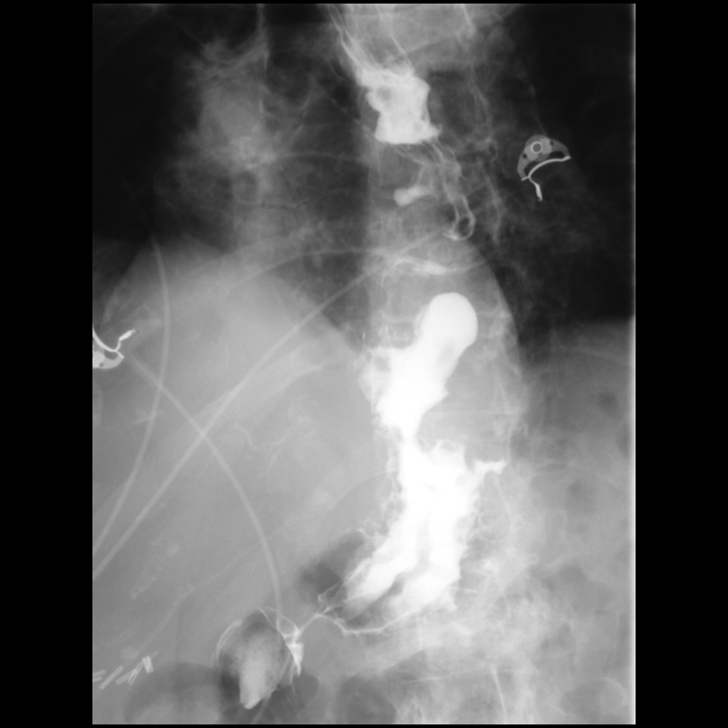

[Series 7: fluoro_barium 2fps_bw · 0.19mm/px · 1 of 1 slices shown (6 of 6)]
[im 1/1]
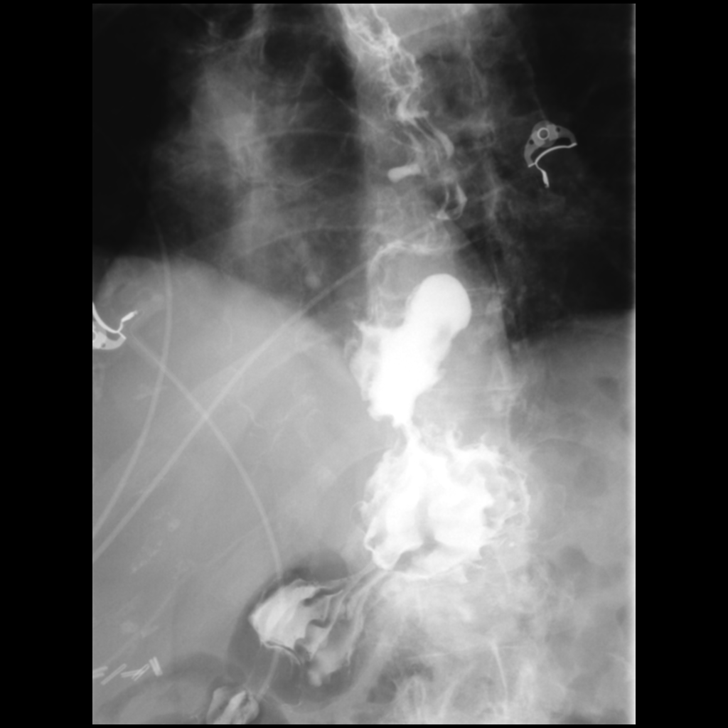

[12 of 16 positions shown; findings below may reference images not displayed]

FINDINGS: Exam is limited due to patient's recent open reduction internal
fixation a hip fracture.

Oral contrast flowed readily through the esophagus into the stomach.
The distal esophagus is extremely tortuous. This finding is similar
to but slightly progressed from comparison exam 7230. There is a
moderate size hiatal hernia.

No high-grade obstruction.  No reflux demonstrated.
IMPRESSION: 1. Extremely tortuous distal esophagus presumably accounts for
feeling of slow passage of food in the esophagus. Recommend small
portion instructions.
2. No high-grade obstruction.
3. Moderate hiatal hernia.
4. Findings similar to but mildly progressed from esophagram of
